# Patient Record
Sex: Male | Born: 1955 | Race: Black or African American | Hispanic: No | Marital: Married
Health system: Northeastern US, Academic
[De-identification: ages and names within clinical notes are randomized; demographics above are authoritative.]

## PROBLEM LIST (undated history)

## (undated) DIAGNOSIS — M109 Gout, unspecified: Secondary | ICD-10-CM

## (undated) HISTORY — DX: Gout, unspecified: M10.9

## (undated) HISTORY — PX: OTHER SURGICAL HISTORY: SHX169

## (undated) HISTORY — PX: HERNIA REPAIR: SHX51

---

## 2000-03-26 ENCOUNTER — Ambulatory Visit: Admission: RE | Admit: 2000-03-26 | Discharge: 2000-03-26 | Payer: Self-pay | Admitting: Neurosurgery

## 2004-02-12 ENCOUNTER — Emergency Department: Payer: Self-pay | Admitting: General Practice

## 2004-02-16 ENCOUNTER — Ambulatory Visit: Payer: Self-pay | Admitting: General Surgery

## 2005-09-27 ENCOUNTER — Emergency Department: Payer: Self-pay | Admitting: Emergency Medicine

## 2005-09-28 ENCOUNTER — Emergency Department: Payer: Self-pay

## 2007-05-29 ENCOUNTER — Emergency Department (HOSPITAL_COMMUNITY): Admission: EM | Admit: 2007-05-29 | Discharge: 2007-05-29 | Payer: Self-pay | Admitting: Emergency Medicine

## 2007-11-13 ENCOUNTER — Emergency Department: Payer: Self-pay | Admitting: Emergency Medicine

## 2008-10-03 ENCOUNTER — Emergency Department: Payer: Self-pay | Admitting: Internal Medicine

## 2009-03-30 ENCOUNTER — Emergency Department: Payer: Self-pay | Admitting: Emergency Medicine

## 2009-04-23 ENCOUNTER — Ambulatory Visit: Payer: Self-pay | Admitting: Gastroenterology

## 2009-05-12 ENCOUNTER — Ambulatory Visit: Payer: Self-pay | Admitting: Gastroenterology

## 2009-05-27 ENCOUNTER — Emergency Department: Payer: Self-pay | Admitting: Emergency Medicine

## 2010-01-17 ENCOUNTER — Emergency Department: Payer: Self-pay | Admitting: Unknown Physician Specialty

## 2011-02-26 ENCOUNTER — Emergency Department: Payer: Self-pay | Admitting: Internal Medicine

## 2011-11-18 ENCOUNTER — Emergency Department: Payer: Self-pay | Admitting: Emergency Medicine

## 2011-11-18 LAB — BASIC METABOLIC PANEL
Calcium, Total: 8.4 mg/dL — ABNORMAL LOW (ref 8.5–10.1)
EGFR (African American): >60
EGFR (Non-African Amer.): 58 — ABNORMAL LOW
Glucose: 95 mg/dL (ref 65–99)
Osmolality: 285 (ref 275–301)
Potassium: 3.9 mmol/L (ref 3.5–5.1)

## 2011-11-18 LAB — CBC
HCT: 44.8 % (ref 40.0–52.0)
HGB: 16.1 g/dL (ref 13.0–18.0)
MCHC: 35.9 g/dL (ref 32.0–36.0)
MCV: 97 fL (ref 80–100)
RDW: 12.9 % (ref 11.5–14.5)

## 2011-11-18 LAB — CK TOTAL AND CKMB (NOT AT ARMC): CK, Total: 317 U/L — ABNORMAL HIGH (ref 35–232)

## 2011-11-18 LAB — TROPONIN I: Troponin-I: <0.02 ng/mL

## 2012-09-09 ENCOUNTER — Emergency Department: Payer: Self-pay | Admitting: Emergency Medicine

## 2013-04-01 ENCOUNTER — Emergency Department: Payer: Self-pay | Admitting: Emergency Medicine

## 2013-09-14 ENCOUNTER — Emergency Department: Payer: Self-pay | Admitting: Emergency Medicine

## 2014-05-06 ENCOUNTER — Emergency Department: Admit: 2014-05-06 | Disposition: A | Discharge status: Home / Self Care | Payer: Self-pay | Admitting: Internal Medicine

## 2014-05-27 ENCOUNTER — Emergency Department
Admit: 2014-05-27 | Disposition: A | Discharge status: Home / Self Care | Payer: Self-pay | Admitting: Emergency Medicine

## 2014-05-28 ENCOUNTER — Other Ambulatory Visit
Admit: 2014-05-28 | Disposition: A | Discharge status: Home / Self Care | Payer: Self-pay | Attending: Specialist | Admitting: Specialist

## 2014-05-28 LAB — SYNOVIAL CELL COUNT + DIFF, W/ CRYSTALS: Nucleated Cell Count: 15687 /mm3

## 2014-06-27 ENCOUNTER — Other Ambulatory Visit: Payer: Self-pay

## 2014-06-27 ENCOUNTER — Emergency Department
Admission: EM | Admit: 2014-06-27 | Discharge: 2014-06-27 | Disposition: A | Discharge status: Home / Self Care | Payer: BLUE CROSS/BLUE SHIELD | Attending: Emergency Medicine | Admitting: Emergency Medicine

## 2014-06-27 ENCOUNTER — Encounter: Payer: Self-pay | Admitting: Emergency Medicine

## 2014-06-27 DIAGNOSIS — I951 Orthostatic hypotension: Secondary | ICD-10-CM | POA: Diagnosis not present

## 2014-06-27 DIAGNOSIS — E860 Dehydration: Secondary | ICD-10-CM | POA: Insufficient documentation

## 2014-06-27 DIAGNOSIS — E861 Hypovolemia: Secondary | ICD-10-CM | POA: Diagnosis not present

## 2014-06-27 DIAGNOSIS — Z720 Tobacco use: Secondary | ICD-10-CM | POA: Diagnosis not present

## 2014-06-27 DIAGNOSIS — R55 Syncope and collapse: Secondary | ICD-10-CM | POA: Diagnosis present

## 2014-06-27 LAB — CBC WITH DIFFERENTIAL/PLATELET
BASOS PCT: 0 %
Basophils Absolute: 0 10*3/uL (ref 0–0.1)
EOS ABS: 0.2 10*3/uL (ref 0–0.7)
EOS PCT: 2 %
HCT: 44.3 % (ref 40.0–52.0)
Hemoglobin: 15 g/dL (ref 13.0–18.0)
Lymphocytes Relative: 11 %
Lymphs Abs: 1.1 10*3/uL (ref 1.0–3.6)
MCH: 33 pg (ref 26.0–34.0)
MCHC: 33.8 g/dL (ref 32.0–36.0)
MCV: 97.6 fL (ref 80.0–100.0)
MONO ABS: 0.7 10*3/uL (ref 0.2–1.0)
MONOS PCT: 7 %
NEUTROS ABS: 8 10*3/uL — AB (ref 1.4–6.5)
Neutrophils Relative %: 80 %
PLATELETS: 161 10*3/uL (ref 150–440)
RBC: 4.54 MIL/uL (ref 4.40–5.90)
RDW: 13.1 % (ref 11.5–14.5)
WBC: 9.9 10*3/uL (ref 3.8–10.6)

## 2014-06-27 LAB — BASIC METABOLIC PANEL
Anion gap: 12 (ref 5–15)
BUN: 9 mg/dL (ref 6–20)
CALCIUM: 8.5 mg/dL — AB (ref 8.9–10.3)
CHLORIDE: 103 mmol/L (ref 101–111)
CO2: 25 mmol/L (ref 22–32)
Creatinine, Ser: 1.39 mg/dL — ABNORMAL HIGH (ref 0.61–1.24)
GFR calc Af Amer: >60 mL/min (ref >60)
GFR, EST NON AFRICAN AMERICAN: 54 mL/min — AB (ref >60)
Glucose, Bld: 125 mg/dL — ABNORMAL HIGH (ref 65–99)
POTASSIUM: 3.6 mmol/L (ref 3.5–5.1)
Sodium: 140 mmol/L (ref 135–145)

## 2014-06-27 MED ORDER — SODIUM CHLORIDE 0.9 % IV BOLUS (SEPSIS)
1000.0000 mL | Freq: Once | INTRAVENOUS | Status: AC
  Administered 2014-06-27: 1000 mL via INTRAVENOUS

## 2014-06-27 NOTE — Discharge Instructions (Signed)
Dehydration, Adult Dehydration is when you lose more fluids from the body than you take in. Vital organs like the kidneys, brain, and heart cannot function without a proper amount of fluids and salt. Any loss of fluids from the body can cause dehydration.  CAUSES   Vomiting.  Diarrhea.  Excessive sweating.  Excessive urine output.  Fever. SYMPTOMS  Mild dehydration  Thirst.  Dry lips.  Slightly dry mouth. Moderate dehydration  Very dry mouth.  Sunken eyes.  Skin does not bounce back quickly when lightly pinched and released.  Dark urine and decreased urine production.  Decreased tear production.  Headache. Severe dehydration  Very dry mouth.  Extreme thirst.  Rapid, weak pulse (more than 100 beats per minute at rest).  Cold hands and feet.  Not able to sweat in spite of heat and temperature.  Rapid breathing.  Blue lips.  Confusion and lethargy.  Difficulty being awakened.  Minimal urine production.  No tears. DIAGNOSIS  Your caregiver will diagnose dehydration based on your symptoms and your exam. Blood and urine tests will help confirm the diagnosis. The diagnostic evaluation should also identify the cause of dehydration. TREATMENT  Treatment of mild or moderate dehydration can often be done at home by increasing the amount of fluids that you drink. It is best to drink small amounts of fluid more often. Drinking too much at one time can make vomiting worse. Refer to the home care instructions below. Severe dehydration needs to be treated at the hospital where you will probably be given intravenous (IV) fluids that contain water and electrolytes. HOME CARE INSTRUCTIONS   Ask your caregiver about specific rehydration instructions.  Drink enough fluids to keep your urine clear or pale yellow.  Drink small amounts frequently if you have nausea and vomiting.  Eat as you normally do.  Avoid:  Foods or drinks high in sugar.  Carbonated  drinks.  Juice.  Extremely hot or cold fluids.  Drinks with caffeine.  Fatty, greasy foods.  Alcohol.  Tobacco.  Overeating.  Gelatin desserts.  Wash your hands well to avoid spreading bacteria and viruses.  Only take over-the-counter or prescription medicines for pain, discomfort, or fever as directed by your caregiver.  Ask your caregiver if you should continue all prescribed and over-the-counter medicines.  Keep all follow-up appointments with your caregiver. SEEK MEDICAL CARE IF:  You have abdominal pain and it increases or stays in one area (localizes).  You have a rash, stiff neck, or severe headache.  You are irritable, sleepy, or difficult to awaken.  You are weak, dizzy, or extremely thirsty. SEEK IMMEDIATE MEDICAL CARE IF:   You are unable to keep fluids down or you get worse despite treatment.  You have frequent episodes of vomiting or diarrhea.  You have blood or green matter (bile) in your vomit.  You have blood in your stool or your stool looks black and tarry.  You have not urinated in 6 to 8 hours, or you have only urinated a small amount of very dark urine.  You have a fever.  You faint. MAKE SURE YOU:   Understand these instructions.  Will watch your condition.  Will get help right away if you are not doing well or get worse. Document Released: 01/16/2005 Document Revised: 04/10/2011 Document Reviewed: 09/05/2010 [facility] Patient Information 2015 [place], [place]. This information is not intended to replace advice given to you by your health care provider. Make sure you discuss any questions you have with your health care  provider.  Syncope Syncope is a medical term for fainting or passing out. This means you lose consciousness and drop to the ground. People are generally unconscious for less than 5 minutes. You may have some muscle twitches for up to 15 seconds before waking up and returning to normal. Syncope occurs more often in older  adults, but it can happen to anyone. While most causes of syncope are not dangerous, syncope can be a sign of a serious medical problem. It is important to seek medical care.  CAUSES  Syncope is caused by a sudden drop in blood flow to the brain. The specific cause is often not determined. Factors that can bring on syncope include:  Taking medicines that lower blood pressure.  Sudden changes in posture, such as standing up quickly.  Taking more medicine than prescribed.  Standing in one place for too long.  Seizure disorders.  Dehydration and excessive exposure to heat.  Low blood sugar (hypoglycemia).  Straining to have a bowel movement.  Heart disease, irregular heartbeat, or other circulatory problems.  Fear, emotional distress, seeing blood, or severe pain. SYMPTOMS  Right before fainting, you may:  Feel dizzy or light-headed.  Feel nauseous.  See all white or all black in your field of vision.  Have cold, clammy skin. DIAGNOSIS  Your health care provider will ask about your symptoms, perform a physical exam, and perform an electrocardiogram (ECG) to record the electrical activity of your heart. Your health care provider may also perform other heart or blood tests to determine the cause of your syncope which may include:  Transthoracic echocardiogram (TTE). During echocardiography, sound waves are used to evaluate how blood flows through your heart.  Transesophageal echocardiogram (TEE).  Cardiac monitoring. This allows your health care provider to monitor your heart rate and rhythm in real time.  Holter monitor. This is a portable device that records your heartbeat and can help diagnose heart arrhythmias. It allows your health care provider to track your heart activity for several days, if needed.  Stress tests by exercise or by giving medicine that makes the heart beat faster. TREATMENT  In most cases, no treatment is needed. Depending on the cause of your syncope,  your health care provider may recommend changing or stopping some of your medicines. HOME CARE INSTRUCTIONS  Have someone stay with you until you feel stable.  Do not drive, use machinery, or play sports until your health care provider says it is okay.  Keep all follow-up appointments as directed by your health care provider.  Lie down right away if you start feeling like you might faint. Breathe deeply and steadily. Wait until all the symptoms have passed.  Drink enough fluids to keep your urine clear or pale yellow.  If you are taking blood pressure or heart medicine, get up slowly and take several minutes to sit and then stand. This can reduce dizziness. SEEK IMMEDIATE MEDICAL CARE IF:   You have a severe headache.  You have unusual pain in the chest, abdomen, or back.  You are bleeding from your mouth or rectum, or you have black or tarry stool.  You have an irregular or very fast heartbeat.  You have pain with breathing.  You have repeated fainting or seizure-like jerking during an episode.  You faint when sitting or lying down.  You have confusion.  You have trouble walking.  You have severe weakness.  You have vision problems. If you fainted, call your local emergency services (911 in [place]  Do not drive yourself to the hospital.  MAKE SURE YOU:  Understand these instructions.  Will watch your condition.  Will get help right away if you are not doing well or get worse. Document Released: 01/16/2005 Document Revised: 01/21/2013 Document Reviewed: 03/17/2011 [facility] Patient Information 2015 [place]. This information is not intended to replace advice given to you by your health care provider. Make sure you discuss any questions you have with your health care provider.  Rehydration, Adult Rehydration is the replacement of body fluids lost during dehydration. Dehydration is an extreme loss of body fluids to the point of body function impairment. There are  many ways extreme fluid loss can occur, including vomiting, diarrhea, or excess sweating. Recovering from dehydration requires replacing lost fluids, continuing to eat to maintain strength, and avoiding foods and beverages that may contribute to further fluid loss or may increase nausea. HOW TO REHYDRATE In most cases, rehydration involves the replacement of not only fluids but also carbohydrates and basic body salts. Rehydration with an oral rehydration solution is one way to replace essential nutrients lost through dehydration. An oral rehydration solution can be purchased at pharmacies, retail stores, and online. Premixed packets of powder that you combine with water to make a solution are also sold. You can prepare an oral rehydration solution at home by mixing the following ingredients together:    - tsp table salt.   tsp baking soda.   tsp salt substitute containing potassium chloride.  1 tablespoons sugar.  1 L (34 oz) of water. Be sure to use exact measurements. Including too much sugar can make diarrhea worse. Drink -1 cup (120-240 mL) of oral rehydration solution each time you have diarrhea or vomit. If drinking this amount makes your vomiting worse, try drinking smaller amounts more often. For example, drink 1-3 tsp every 5-10 minutes.  A general rule for staying hydrated is to drink 1-2 L of fluid per day. Talk to your caregiver about the specific amount you should be drinking each day. Drink enough fluids to keep your urine clear or pale yellow. EATING WHEN DEHYDRATED Even if you have had severe sweating or you are having diarrhea, do not stop eating. Many healthy items in a normal diet are okay to continue eating while recovering from dehydration. The following tips can help you to lessen nausea when you eat:  Ask someone else to prepare your food. Cooking smells may worsen nausea.  Eat in a well-ventilated room away from cooking smells.  Sit up when you eat. Avoid lying down  until 1-2 hours after eating.  Eat small amounts when you eat.  Eat foods that are easy to digest. These include soft, well-cooked, or mashed foods. FOODS AND BEVERAGES TO AVOID Avoid eating or drinking the following foods and beverages that may increase nausea or further loss of fluid:   Fruit juices with a high sugar content, such as concentrated juices.  Alcohol.  Beverages containing caffeine.  Carbonated drinks. They may cause a lot of gas.  Foods that may cause a lot of gas, such as cabbage, broccoli, and beans.  Fatty, greasy, and fried foods.  Spicy, very salty, and very sweet foods or drinks.  Foods or drinks that are very hot or very cold. Consume food or drinks at or near room temperature.  Foods that need a lot of chewing, such as raw vegetables.  Foods that are sticky or hard to swallow, such as peanut butter. Document Released: 04/10/2011 Document Revised: 10/11/2011 Document Reviewed:  04/10/2011 [employer] Patient Information 2015 [place], [place]. This information is not intended to replace advice given to you by your health care provider. Make sure you discuss any questions you have with your health care provider.

## 2014-06-27 NOTE — ED Provider Notes (Signed)
[facility] Emergency Department Provider Note  ____________________________________________  Time seen: 4:20 PM  I have reviewed the triage vital signs and the nursing notes.   HISTORY  Chief Complaint Weakness    HPI Robert Tanner is a 59 y.o. male is brought to the ED by EMS for syncope today. The patient was is in his usual state of health and felt totally fine until he went to donate plasma this morning. After donating plasma, he started to feel weak and fatigued. He got up to walk out of the donation center and felt like he was going to pass out, so he sat down for a minute. Eventually himself home but continued to feel weak and lightheaded and then had an episode of syncope at home. Denies any chest pain shortness of breath headache vision changes numbness tingling or weakness or abdominal pain or back pain around the syncope before or after. He then had a second episode on standing up again. EMS was called and patient was brought to the ED. EMS noted an initial systolic blood pressure of 80, which improved to 100 quickly with initial IV fluids patient currently feels fine except for fatigue. He reports he is only given plasma once a week ago. He notes that he thinks that the recent hot weather with temperatures in the upper 80s and sunshine has affected him in office and also become more dehydrated     History reviewed. No pertinent past medical history. Negative There are no active problems to display for this patient.   Past Surgical History  Procedure Laterality Date  . Left knee surgery      No current outpatient prescriptions on file. None Allergies Review of patient's allergies indicates no known allergies. None History reviewed. No pertinent family history.  Social History History  Substance Use Topics  . Smoking status: Current Every Day Smoker  . Smokeless tobacco: Not on file  . Alcohol Use: Yes    Review of  Systems  Constitutional: No fever or chills. No weight changes, positive fatigue  Eyes:No blurry vision or double vision.  ENT: No sore throat. Cardiovascular: No chest pain. Respiratory: No dyspnea or cough. Gastrointestinal: Negative for abdominal pain, vomiting and diarrhea.  No BRBPR or melena. Genitourinary: Negative for dysuria, urinary retention, bloody urine, or difficulty urinating. Musculoskeletal: Negative for back pain. No joint swelling or pain. Skin: Negative for rash. Neurological: Negative for headaches, focal weakness or numbness. Psychiatric:No anxiety or depression.   Endocrine:No hot/cold intolerance, changes in energy, or sleep difficulty.  10-point ROS otherwise negative.  ____________________________________________   PHYSICAL EXAM:  VITAL SIGNS: ED Triage Vitals  Enc Vitals Group     BP 06/27/14 1608 121/74 mmHg     Pulse Rate 06/27/14 1608 74     Resp 06/27/14 1608 18     Temp 06/27/14 1608 97.4 F (36.3 C)     Temp Source 06/27/14 1608 Oral     SpO2 06/27/14 1608 98 %     Weight 06/27/14 1608 160 lb (72.576 kg)     Height 06/27/14 1608 6' (1.829 m)     Head Cir --      Peak Flow --      Pain Score --      Pain Loc --      Pain Edu? --      Excl. in GC? --      Constitutional: Alert and oriented. Well appearing and in no distress. Eyes: No scleral icterus. No  conjunctival pallor. PERRL. EOMI ENT   Head: Normocephalic and atraumatic.   Nose: No congestion/rhinnorhea. No septal hematoma   Mouth/Throat: MMM, no pharyngeal erythema. No peritonsillar mass. No uvula shift.   Neck: No stridor. No SubQ emphysema. No meningismus. Hematological/Lymphatic/Immunilogical: No cervical lymphadenopathy. Cardiovascular: RRR. Normal and symmetric distal pulses are present in all extremities. No murmurs, rubs, or gallops. Respiratory: Normal respiratory effort without tachypnea nor retractions. Breath sounds are clear and equal bilaterally. No  wheezes/rales/rhonchi. Gastrointestinal: Soft and nontender. No distention. There is no CVA tenderness.  No rebound, rigidity, or guarding. Genitourinary: deferred Musculoskeletal: Nontender with normal range of motion in all extremities. No joint effusions.  No lower extremity tenderness.  No edema. Neurologic:   Normal speech and language.  CN 2-10 normal. Motor grossly intact. No pronator drift.  Normal gait. No gross focal neurologic deficits are appreciated.  Skin:  Skin is warm, dry and intact. No rash noted.  No petechiae, purpura, or bullae. Psychiatric: Mood and affect are normal. Speech and behavior are normal. Patient exhibits appropriate insight and judgment.  ____________________________________________    LABS (pertinent positives/negatives) (all labs ordered are listed, but only abnormal results are displayed) Labs Reviewed  BASIC METABOLIC PANEL - Abnormal; Notable for the following:    Glucose, Bld 125 (*)    Creatinine, Ser 1.39 (*)    Calcium 8.5 (*)    GFR calc non Af Amer 54 (*)    All other components within normal limits  CBC WITH DIFFERENTIAL/PLATELET - Abnormal; Notable for the following:    Neutro Abs 8.0 (*)    All other components within normal limits   ____________________________________________   EKG  Interpreted by me Normal sinus rhythm rate of 74 normal axis and intervals QRS and ST segments and T waves. There is 1 PVC on the 6 second strip.  ____________________________________________    RADIOLOGY    ____________________________________________   PROCEDURES  ____________________________________________   INITIAL IMPRESSION / ASSESSMENT AND PLAN / ED COURSE  Pertinent labs & imaging results that were available during my care of the patient were reviewed by me and considered in my medical decision making (see chart for details).  No acute distress well appearing. History very strongly consistent with dehydration related to  hypovolemia from body fluid donation. The patient feels that he could eat, which may help since he has not eaten since donating. We'll give him food and a liter of IV fluids at that point I think he'll be stable for discharge. We will check his labs in the meantime.  ----------------------------------------- 5:17 PM on 06/27/2014 -----------------------------------------  Workup unremarkable patient even notably stable condition discharge home and follow up with PCP  ____________________________________________   FINAL CLINICAL IMPRESSION(S) / ED DIAGNOSES  Final diagnoses:  Hypovolemia dehydration  Syncope due to orthostatic hypotension      Sharman CheekPhillip Malessa Zartman, MD 06/27/14 1718

## 2014-06-27 NOTE — ED Notes (Addendum)
Patient to ED via EMS with c.o weakness and dizziness. Patient states he donated plasma this AM and has been feeling poorly since. Patient is alert and orietned. Per EMS patient had initial systolic of 80, received 100 cc of NS and systolic elevated to 100. Patient denies nausea or vomiting. Patient also complains of bilateral leg cramps.

## 2014-10-01 ENCOUNTER — Encounter: Payer: Self-pay | Admitting: Family Medicine

## 2014-10-01 ENCOUNTER — Ambulatory Visit (INDEPENDENT_AMBULATORY_CARE_PROVIDER_SITE_OTHER): Payer: BLUE CROSS/BLUE SHIELD | Admitting: Family Medicine

## 2014-10-01 VITALS — BP 109/72 | HR 88 | Temp 98.3°F | Ht 72.0 in | Wt 180.0 lb

## 2014-10-01 DIAGNOSIS — Z23 Encounter for immunization: Secondary | ICD-10-CM

## 2014-10-01 DIAGNOSIS — M1A9XX0 Chronic gout, unspecified, without tophus (tophi): Secondary | ICD-10-CM | POA: Diagnosis not present

## 2014-10-01 DIAGNOSIS — R252 Cramp and spasm: Secondary | ICD-10-CM

## 2014-10-01 LAB — CBC WITH DIFFERENTIAL/PLATELET
Hematocrit: 44.4 % (ref 37.5–51.0)
Hemoglobin: 15.8 g/dL (ref 12.6–17.7)
LYMPHS: 21 %
Lymphocytes Absolute: 1.4 10*3/uL (ref 0.7–3.1)
MCH: 34.9 pg — AB (ref 26.6–33.0)
MCHC: 35.6 g/dL (ref 31.5–35.7)
MCV: 98 fL — AB (ref 79–97)
MID (Absolute): 0.8 10*3/uL (ref 0.1–1.6)
MID: 12 %
NEUTROS PCT: 67 %
Neutrophils Absolute: 4.4 10*3/uL (ref 1.4–7.0)
PLATELETS: 213 10*3/uL (ref 150–379)
RBC: 4.53 x10E6/uL (ref 4.14–5.80)
RDW: 13.6 % (ref 12.3–15.4)
WBC: 6.6 10*3/uL (ref 3.4–10.8)

## 2014-10-01 NOTE — Progress Notes (Signed)
BP 109/72 mmHg  Pulse 88  Temp(Src) 98.3 F (36.8 C)  Ht 6' (1.829 m)  Wt 180 lb (81.647 kg)  BMI 24.41 kg/m2  SpO2 97%   Subjective:    Patient ID: Robert Tanner, male    DOB: 1955-08-24, 59 y.o.   MRN: [id number]  HPI: Robert Tanner is a 59 y.o. male who presents today to establish care and for the following:  Chief Complaint  Patient presents with  . Establish Care    knows he had a colonoscopy, thinks it was 2010. Pt states he is also having cramps in thighs and arms.    2 months ago went and gave plasma and syncopized 2x, was having bad cramps in his legs and arms. Went to the ER and had fluids- was dehydrated.    Has cut back on the plasma donations, but his arms and legs are cramping again.   LEG CRAMPS Duration: 2 months Pain: yes Severity: moderate  Quality:  sharp and pulling Location:  thighs Bilateral:  yes Onset: sudden Frequency: intermittent Time of  day:   at random Sudden unintentional leg jerking:   yes Paresthesias:   no Decreased sensation:  no Weakness:   no Insomnia:   no Fatigue:   no Status: stable Treatments attempted: Increased fluids   Relevant past medical, surgical, family and social history reviewed and updated as indicated. Interim medical history since our last visit reviewed. Allergies and medications reviewed and updated.  Review of Systems  Constitutional: Negative.   Respiratory: Negative.   Gastrointestinal: Negative.   Endocrine: Negative.   Genitourinary: Negative.   Psychiatric/Behavioral: Negative.     Per HPI unless specifically indicated above     Objective:    BP 109/72 mmHg  Pulse 88  Temp(Src) 98.3 F (36.8 C)  Ht 6' (1.829 m)  Wt 180 lb (81.647 kg)  BMI 24.41 kg/m2  SpO2 97%  Wt Readings from Last 3 Encounters:  10/01/14 180 lb (81.647 kg)  06/27/14 160 lb (72.576 kg)    Physical Exam  Constitutional: He is oriented to person, place, and time. He appears well-developed and well-nourished. No  distress.  HENT:  Head: Normocephalic and atraumatic.  Right Ear: Hearing normal.  Left Ear: Hearing normal.  Nose: Nose normal.  Eyes: Conjunctivae and lids are normal. Right eye exhibits no discharge. Left eye exhibits no discharge. No scleral icterus.  Cardiovascular: Normal rate, regular rhythm, normal heart sounds and intact distal pulses.  Exam reveals no gallop and no friction rub.   No murmur heard. Pulmonary/Chest: Effort normal and breath sounds normal. No respiratory distress. He has no wheezes. He has no rales. He exhibits no tenderness.  Musculoskeletal: Normal range of motion.  Neurological: He is alert and oriented to person, place, and time.  Skin: Skin is warm, dry and intact. No rash noted. No erythema. No pallor.  Psychiatric: He has a normal mood and affect. His speech is normal and behavior is normal. Judgment and thought content normal. Cognition and memory are normal.    Results for orders placed or performed during the hospital encounter of 06/27/14  Basic metabolic panel  Result Value Ref Range   Sodium 140 135 - 145 mmol/L   Potassium 3.6 3.5 - 5.1 mmol/L   Chloride 103 101 - 111 mmol/L   CO2 25 22 - 32 mmol/L   Glucose, Bld 125 (H) 65 - 99 mg/dL   BUN 9 6 - 20 mg/dL   Creatinine, Ser 9.52 (H)  0.61 - 1.24 mg/dL   Calcium 8.5 (L) 8.9 - 10.3 mg/dL   GFR calc non Af Amer 54 (L) >60 mL/min   GFR calc Af Amer >60 >60 mL/min   Anion gap 12 5 - 15  CBC with Differential  Result Value Ref Range   WBC 9.9 3.8 - 10.6 K/uL   RBC 4.54 4.40 - 5.90 MIL/uL   Hemoglobin 15.0 13.0 - 18.0 g/dL   HCT 16.1 09.6 - 04.5 %   MCV 97.6 80.0 - 100.0 fL   MCH 33.0 26.0 - 34.0 pg   MCHC 33.8 32.0 - 36.0 g/dL   RDW 40.9 81.1 - 91.4 %   Platelets 161 150 - 440 K/uL   Neutrophils Relative % 80 %   Neutro Abs 8.0 (H) 1.4 - 6.5 K/uL   Lymphocytes Relative 11 %   Lymphs Abs 1.1 1.0 - 3.6 K/uL   Monocytes Relative 7 %   Monocytes Absolute 0.7 0.2 - 1.0 K/uL   Eosinophils  Relative 2 %   Eosinophils Absolute 0.2 0 - 0.7 K/uL   Basophils Relative 0 %   Basophils Absolute 0.0 0 - 0.1 K/uL      Assessment & Plan:   Problem List Items Addressed This Visit    None    Visit Diagnoses    Muscle cramps    -  Primary    Of unclear etiology. Will continue hydrating and will check labs today. Await results. Replace/replenish as neesed.     Relevant Orders    CBC With Differential/Platelet    Comprehensive metabolic panel    Hepatitis C Antibody    TSH    Vit D  25 hydroxy (rtn osteoporosis monitoring)    Immunization due        Tdap given today.     Relevant Orders    Tdap vaccine greater than or equal to 7yo IM (Completed)    Chronic gout without tophus, unspecified cause, unspecified site        Will check uric acid today. Await results.     Relevant Orders    Uric acid        Follow up plan: Return in about 4 weeks (around 10/29/2014).

## 2014-10-01 NOTE — Patient Instructions (Signed)

## 2014-10-02 LAB — COMPREHENSIVE METABOLIC PANEL
ALBUMIN: 3.9 g/dL (ref 3.5–5.5)
ALT: 19 IU/L (ref 0–44)
AST: 19 IU/L (ref 0–40)
Albumin/Globulin Ratio: 1.8 (ref 1.1–2.5)
Alkaline Phosphatase: 72 IU/L (ref 39–117)
BILIRUBIN TOTAL: 0.6 mg/dL (ref 0.0–1.2)
BUN / CREAT RATIO: 11 (ref 9–20)
BUN: 16 mg/dL (ref 6–24)
CO2: 23 mmol/L (ref 18–29)
CREATININE: 1.46 mg/dL — AB (ref 0.76–1.27)
Calcium: 9.1 mg/dL (ref 8.7–10.2)
Chloride: 105 mmol/L (ref 97–108)
GFR, EST AFRICAN AMERICAN: 60 mL/min/{1.73_m2} (ref >59)
GFR, EST NON AFRICAN AMERICAN: 52 mL/min/{1.73_m2} — AB (ref >59)
GLUCOSE: 79 mg/dL (ref 65–99)
Globulin, Total: 2.2 g/dL (ref 1.5–4.5)
Potassium: 4.7 mmol/L (ref 3.5–5.2)
Sodium: 142 mmol/L (ref 134–144)
TOTAL PROTEIN: 6.1 g/dL (ref 6.0–8.5)

## 2014-10-02 LAB — URIC ACID: URIC ACID: 8.7 mg/dL — AB (ref 3.7–8.6)

## 2014-10-02 LAB — HEPATITIS C ANTIBODY: Hep C Virus Ab: <0.1 s/co ratio (ref 0.0–0.9)

## 2014-10-02 LAB — TSH: TSH: 1.14 u[IU]/mL (ref 0.450–4.500)

## 2014-10-02 LAB — VITAMIN D 25 HYDROXY (VIT D DEFICIENCY, FRACTURES): Vit D, 25-Hydroxy: 22.9 ng/mL — ABNORMAL LOW (ref 30.0–100.0)

## 2014-10-03 LAB — PHOSPHORUS: Phosphorus: 3.5 mg/dL (ref 2.5–4.5)

## 2014-10-03 LAB — SPECIMEN STATUS REPORT

## 2014-10-03 LAB — MAGNESIUM: Magnesium: 2 mg/dL (ref 1.6–2.3)

## 2014-10-12 ENCOUNTER — Telehealth: Payer: Self-pay | Admitting: Family Medicine

## 2014-10-12 NOTE — Telephone Encounter (Signed)
Called patient to let him know that his kidney function is down a little bit- will increase water intake. Has also has low vitamin D. Will start OTC 2000 IU vitamin D3. Will call if leg cramps not getting better or getting worse.

## 2014-10-29 ENCOUNTER — Encounter: Payer: Self-pay | Admitting: Emergency Medicine

## 2014-10-29 ENCOUNTER — Emergency Department
Admission: EM | Admit: 2014-10-29 | Discharge: 2014-10-29 | Disposition: A | Discharge status: Home / Self Care | Payer: BLUE CROSS/BLUE SHIELD | Attending: Emergency Medicine | Admitting: Emergency Medicine

## 2014-10-29 ENCOUNTER — Emergency Department: Payer: BLUE CROSS/BLUE SHIELD

## 2014-10-29 DIAGNOSIS — R0989 Other specified symptoms and signs involving the circulatory and respiratory systems: Secondary | ICD-10-CM

## 2014-10-29 DIAGNOSIS — Z720 Tobacco use: Secondary | ICD-10-CM | POA: Diagnosis not present

## 2014-10-29 DIAGNOSIS — J029 Acute pharyngitis, unspecified: Secondary | ICD-10-CM | POA: Diagnosis present

## 2014-10-29 DIAGNOSIS — F458 Other somatoform disorders: Secondary | ICD-10-CM | POA: Insufficient documentation

## 2014-10-29 MED ORDER — RANITIDINE HCL 150 MG/10ML PO SYRP: 150.0000 mg | ORAL_SOLUTION | Freq: Two times a day (BID) | ORAL | Status: DC

## 2014-10-29 MED ORDER — FAMOTIDINE 20 MG PO TABS
40.0000 mg | ORAL_TABLET | Freq: Once | ORAL | Status: AC
  Administered 2014-10-29: 40 mg via ORAL
  Filled 2014-10-29: qty 2

## 2014-10-29 MED ORDER — LIDOCAINE VISCOUS 2 % MT SOLN
15.0000 mL | Freq: Once | OROMUCOSAL | Status: AC
  Administered 2014-10-29: 15 mL via OROMUCOSAL
  Filled 2014-10-29: qty 15

## 2014-10-29 NOTE — Discharge Instructions (Signed)
Dysphagia Swallowing problems (dysphagia) occur when solids and liquids seem to stick in your throat on the way down to your stomach, or the food takes longer to get to the stomach. Other symptoms include regurgitating food, noises coming from the throat, chest discomfort with swallowing, and a feeling of fullness or the feeling of something being stuck in your throat when swallowing. When blockage in your throat is complete, it may be associated with drooling. CAUSES  Problems with swallowing may occur because of problems with the muscles. The food cannot be propelled in the usual manner into your stomach. You may have ulcers, scar tissue, or inflammation in the tube down which food travels from your mouth to your stomach (esophagus), which blocks food from passing normally into the stomach. Causes of inflammation include:  Acid reflux from your stomach into your esophagus.  Infection.  Radiation treatment for cancer.  Medicines taken without enough fluids to wash them down into your stomach. You may have nerve problems that prevent signals from being sent to the muscles of your esophagus to contract and move your food down to your stomach. Globus pharyngeus is a relatively common problem in which there is a sense of an obstruction or difficulty in swallowing, without any physical abnormalities of the swallowing passages being found. This problem usually improves over time with reassurance and testing to rule out other causes. DIAGNOSIS Dysphagia can be diagnosed and its cause can be determined by tests in which you swallow a white substance that helps illuminate the inside of your throat (contrast medium) while X-rays are taken. Sometimes a flexible telescope that is inserted down your throat (endoscopy) to look at your esophagus and stomach is used. TREATMENT   If the dysphagia is caused by acid reflux or infection, medicines may be used.  If the dysphagia is caused by problems with your  swallowing muscles, swallowing therapy may be used to help you strengthen your swallowing muscles.  If the dysphagia is caused by a blockage or mass, procedures to remove the blockage may be done. HOME CARE INSTRUCTIONS  Try to eat soft food that is easier to swallow and check your weight on a daily basis to be sure that it is not decreasing.  Be sure to drink liquids when sitting upright (not lying down). SEEK MEDICAL CARE IF:  You are losing weight because you are unable to swallow.  You are coughing when you drink liquids (aspiration).  You are coughing up partially digested food. SEEK IMMEDIATE MEDICAL CARE IF:  You are unable to swallow your own saliva .  You are having shortness of breath or a fever, or both.  You have a hoarse voice along with difficulty swallowing. MAKE SURE YOU:  Understand these instructions.  Will watch your condition.  Will get help right away if you are not doing well or get worse. Document Released: 01/14/2000 Document Revised: 06/02/2013 Document Reviewed: 07/05/2012 [facility] Patient Information 2015 [place], [place]. This information is not intended to replace advice given to you by your health care provider. Make sure you discuss any questions you have with your health care provider.  Globus Syndrome Globus Syndrome is a feeling of a lump or a sensation of something caught in your throat. Eating food or drinking fluids does not seem to get rid of it. Yet it is not noticeable during the actual act of swallowing food or liquids. Usually there is nothing physically wrong. It is troublesome because it is an unpleasant sensation which is sometimes difficult  to ignore and at times may seem to worsen. The syndrome is quite common. It is estimated 45% of the population experiences features of the condition at some stage during their lives. The symptoms are usually temporary. The largest group of people who feel the need to seek medical treatment is females  between the ages of 72 to 19.  CAUSES  Globus Syndrome appears to be triggered by or aggravated by stress, anxiety and depression.  Tension related to stress could product abnormal muscle spasms in the esophagus which would account for the sensation of a lump or ball in your throat.  Frequent swallowing or drying of the throat caused by anxiety or other strong emotions can also produce this uncomfortable sensation in your throat.  Fear and sadness can be expressed by the body in many ways. For instance, if you had a relative with throat cancer you might become overly concerned about your own health and develop uncomfortable sensations in your throat.  The reaction to a crisis or a trauma event in your life can take the form of a lump in your throat. It is as if you are indirectly saying you can not handle or "swallow" one more thing. DIAGNOSIS  Usually your caregiver will know what is wrong by talking to you and examining you. If the condition persists for several days, more testing may be done to make sure there is not another problem present. This is usually not the case. TREATMENT   Reassurance is often the best treatment available. Usually the problem leaves without treatment over several days.  Sometimes anti-anxiety medications may be prescribed.  Counseling or talk therapy can also help with strong underlying emotions.  Note that in most cases this is not something that keeps coming back and you should not be concerned or worried. Document Released: 04/08/2003 Document Revised: 04/10/2011 Document Reviewed: 09/05/2007 [facility] Patient Information 2015 [place], [place]. This information is not intended to replace advice given to you by your health care provider. Make sure you discuss any questions you have with your health care provider.  You should follow-up with Dr. Mechele Collin for further investigation into your difficulty swallowing. Take the Zantac elixir as directed.  Eat soft foods  to prevent gagging.  Follow-up with your provider as planned.

## 2014-10-29 NOTE — ED Notes (Signed)
Patient comes in complaining of sore throat that started this morning around 5 am and has been very difficult to swallow.

## 2014-10-29 NOTE — ED Provider Notes (Signed)
[facility] Emergency Department Provider Note ____________________________________________  Time seen: 1525  I have reviewed the triage vital signs and the nursing notes.  HISTORY  Chief Complaint  Sore Throat  HPI Robert Tanner is a 59 y.o. male reports to the ED for evaluation of sore throat and difficulty swallowing since about 5 AM. He describes that he awoke from sleep and this morning with a gagging sensation to throat. He denies recent trauma, reflux, cough or congestion. He is noting some pain when he tries to swallow even his spit. He is without fevers, chills, sweats, or vomiting. He denies a previous history of any such occurs. He does admit to some severe reflux a few weeks earlier. He has avoided eating and drinking in last 24 hours due to this increased pain in the room.  Past Medical History  Diagnosis Date  . Gout     There are no active problems to display for this patient.   Past Surgical History  Procedure Laterality Date  . Left knee surgery    . Hernia repair      Current Outpatient Rx  Name  Route  Sig  Dispense  Refill  . colchicine 0.6 MG tablet   Oral   Take 0.6 mg by mouth daily as needed.         . ranitidine (ZANTAC) 150 MG/10ML syrup   Oral   Take 10 mLs (150 mg total) by mouth 2 (two) times daily.   300 mL   0    Allergies Review of patient's allergies indicates no known allergies.  Family History  Problem Relation Age of Onset  . Hypertension Mother   . Stroke Father   . Diabetes Sister   . Diabetes Brother   . Hypertension Brother    Social History Social History  Substance Use Topics  . Smoking status: Current Every Day Smoker -- 1.00 packs/day    Types: Cigarettes  . Smokeless tobacco: Never Used  . Alcohol Use: 14.4 oz/week    24 Cans of beer per week   Review of Systems  Constitutional: Negative for fever. Eyes: Negative for visual changes. ENT: Positive for sore throat and fullness as  above. Cardiovascular: Negative for chest pain. Respiratory: Negative for shortness of breath. Gastrointestinal: Negative for abdominal pain, vomiting and diarrhea. Genitourinary: Negative for dysuria. Musculoskeletal: Negative for back pain. Skin: Negative for rash. Neurological: Negative for headaches, focal weakness or numbness. ____________________________________________  PHYSICAL EXAM:  VITAL SIGNS: ED Triage Vitals  Enc Vitals Group     BP 10/29/14 1412 121/75 mmHg     Pulse Rate 10/29/14 1412 95     Resp 10/29/14 1412 18     Temp 10/29/14 1412 98.7 F (37.1 C)     Temp Source 10/29/14 1412 Oral     SpO2 10/29/14 1412 97 %     Weight 10/29/14 1412 182 lb (82.555 kg)     Height 10/29/14 1412 6' (1.829 m)     Head Cir --      Peak Flow --      Pain Score --      Pain Loc --      Pain Edu? --      Excl. in GC? --    Constitutional: Alert and oriented. Well appearing and in no distress. Eyes: Conjunctivae are normal. PERRL. Normal extraocular movements. ENT   Head: Normocephalic and atraumatic.   Nose: No congestion/rhinorrhea.   Mouth/Throat: Mucous membranes are moist. Uvula is midline  without edema. Normal rise of the soft palateTonsils are small without liths, edema, or exudate. Oropharynx is erythematous generally.    Neck: Supple. No thyromegaly. Hematological/Lymphatic/Immunological: No cervical lymphadenopathy. Cardiovascular: Normal rate, regular rhythm.  Respiratory: Normal respiratory effort. No wheezes/rales/rhonchi. Gastrointestinal: Soft and nontender. No distention. Musculoskeletal: Nontender with normal range of motion in all extremities.  Neurologic:  Normal gait without ataxia. Normal speech and language. No gross focal neurologic deficits are appreciated. Skin:  Skin is warm, dry and intact. No rash noted. Psychiatric: Mood and affect are normal. Patient exhibits appropriate insight and  judgment. ____________________________________________   RADIOLOGY Soft Tissue Neck Film IMPRESSION: No acute abnormality. Osteoarthritic changes of cervical spine.  I, Menshew, Charlesetta Ivory, personally viewed and evaluated these images (plain radiographs) as part of my medical decision making.  ____________________________________________  PROCEDURES  2% viscous lidocaine gargle.  Famotidine 40 mg PO ____________________________________________  INITIAL IMPRESSION / ASSESSMENT AND PLAN / ED COURSE  Reassurance to the patient about normal soft tissue neck film. He is encouraged to follow-up with Dr. Mechele Collin with GI, for ongoing symptoms. He scheduled to see his primary care provider tomorrow afternoon for evaluation. He is discharged home with ranitidine elixir, and encouraged to eat a soft diet. He'll return to the ED immediately for worsening symptoms. ____________________________________________  FINAL CLINICAL IMPRESSION(S) / ED DIAGNOSES  Final diagnoses:  Globus pharyngeus      Lissa Hoard, PA-C 10/29/14 1800  Minna Antis, MD 10/29/14 2013

## 2014-10-30 ENCOUNTER — Ambulatory Visit (INDEPENDENT_AMBULATORY_CARE_PROVIDER_SITE_OTHER): Payer: BLUE CROSS/BLUE SHIELD | Admitting: Family Medicine

## 2014-10-30 ENCOUNTER — Encounter: Payer: Self-pay | Admitting: Family Medicine

## 2014-10-30 VITALS — BP 117/80 | HR 85 | Temp 99.1°F | Ht 70.5 in | Wt 181.0 lb

## 2014-10-30 DIAGNOSIS — R1310 Dysphagia, unspecified: Secondary | ICD-10-CM | POA: Insufficient documentation

## 2014-10-30 DIAGNOSIS — J040 Acute laryngitis: Secondary | ICD-10-CM | POA: Diagnosis not present

## 2014-10-30 DIAGNOSIS — F458 Other somatoform disorders: Secondary | ICD-10-CM | POA: Diagnosis not present

## 2014-10-30 DIAGNOSIS — R0989 Other specified symptoms and signs involving the circulatory and respiratory systems: Secondary | ICD-10-CM

## 2014-10-30 MED ORDER — TRIAMCINOLONE ACETONIDE 40 MG/ML IJ SUSP
40.0000 mg | Freq: Once | INTRAMUSCULAR | Status: AC
  Administered 2014-10-30: 40 mg via INTRAMUSCULAR

## 2014-10-30 MED ORDER — PREDNISONE 10 MG PO TABS: ORAL_TABLET | ORAL | Status: DC

## 2014-10-30 NOTE — Progress Notes (Signed)
BP 117/80 mmHg  Pulse 85  Temp(Src) 99.1 F (37.3 C)  Ht 5' 10.5" (1.791 m)  Wt 181 lb (82.101 kg)  BMI 25.60 kg/m2  SpO2 98%   Subjective:    Patient ID: Robert Tanner, male    DOB: 1955/12/06, 59 y.o.   MRN: [id number]  HPI: Robert Tanner is a 59 y.o. male  Chief Complaint  Patient presents with  . ER Follow Up    Globus Syndrome, patient wants to know if mold could be a cause of this. He states that he also has had a headache for two days and  he never gets headaches.   ER FOLLOW UP Time since discharge: 18 hours Hospital/facility: [facility] Diagnosis: globus pharyngus Procedures/tests: Soft tissue of the neck x-ray Consultants: None New medications: famotidine, viscous lidocaine- didn't fill either Discharge instructions:  Follow up here and GI  Status: stable- no better  DYSPHAGIA- has been exposed to mold over the past couple of days as the ceiling fell down and there was mold on it, had a problem with mold in the past Duration: Started on Wednesday Description of symptom: Feels like a rock or a ball stuck in his throat Onset: Immediately upon swallowing Location of dysphagia: throat Dysphagia to solids only: yes Dysphagia to solids & liquids: yes  Frequency:constant  Progressively getting worse: no Alleviatiating factors: gargling Provoking factors: pillsr and certain foods Status: better- not really EGD: yes Weight loss: no Sensation of lump in throat: yes Heartburn: yes- about 2 weeks ago, then went away Odynophagia: yes Nausea: no Vomiting: no, gagging, but no  Drooling/nasal regurgitation/food spillage: no Coughing/choking/dysphonia: yes Dysarthria: yes Hematemesis: no Regurgitation of undigested food/halitosis: no Chest pain: no  Relevant past medical, surgical, family and social history reviewed and updated as indicated. Interim medical history since our last visit reviewed. Allergies and medications reviewed and updated.  Review of Systems   Constitutional: Negative.   HENT: Negative.   Respiratory: Negative.   Cardiovascular: Negative.   Gastrointestinal: Negative.   Musculoskeletal: Negative.   Psychiatric/Behavioral: Negative.     Per HPI unless specifically indicated above     Objective:    BP 117/80 mmHg  Pulse 85  Temp(Src) 99.1 F (37.3 C)  Ht 5' 10.5" (1.791 m)  Wt 181 lb (82.101 kg)  BMI 25.60 kg/m2  SpO2 98%  Wt Readings from Last 3 Encounters:  10/30/14 181 lb (82.101 kg)  10/29/14 182 lb (82.555 kg)  10/01/14 180 lb (81.647 kg)    Physical Exam  Constitutional: He is oriented to person, place, and time. He appears well-developed and well-nourished. No distress.  HENT:  Head: Normocephalic and atraumatic.  Right Ear: Hearing normal.  Left Ear: Hearing normal.  Nose: Nose normal.  Mouth/Throat: Oropharynx is clear and moist. No oropharyngeal exudate.  Eyes: Conjunctivae, EOM and lids are normal. Right eye exhibits no discharge. Left eye exhibits no discharge. No scleral icterus.  Neck: Normal range of motion. Neck supple. No JVD present. No tracheal deviation present. No thyromegaly present.  Cardiovascular: Normal rate, regular rhythm, normal heart sounds and intact distal pulses.  Exam reveals no gallop and no friction rub.   No murmur heard. Pulmonary/Chest: Effort normal and breath sounds normal. No stridor. No respiratory distress. He has no wheezes. He has no rales. He exhibits no tenderness.  Musculoskeletal: Normal range of motion.  Lymphadenopathy:    He has no cervical adenopathy.  Neurological: He is alert and oriented to person, place, and time.  Skin:  Skin is intact. No rash noted.  Psychiatric: He has a normal mood and affect. His speech is normal and behavior is normal. Judgment and thought content normal. Cognition and memory are normal.  Nursing note and vitals reviewed.   Results for orders placed or performed in visit on 10/01/14  CBC With Differential/Platelet  Result  Value Ref Range   WBC 6.6 3.4 - 10.8 x10E3/uL   RBC 4.53 4.14 - 5.80 x10E6/uL   Hemoglobin 15.8 12.6 - 17.7 g/dL   Hematocrit 16.1 09.6 - 51.0 %   MCV 98 (H) 79 - 97 fL   MCH 34.9 (H) 26.6 - 33.0 pg   MCHC 35.6 31.5 - 35.7 g/dL   RDW 04.5 40.9 - 81.1 %   Platelets 213 150 - 379 x10E3/uL   Neutrophils 67 %   Lymphs 21 %   MID 12 %   Neutrophils Absolute 4.4 1.4 - 7.0 x10E3/uL   Lymphocytes Absolute 1.4 0.7 - 3.1 x10E3/uL   MID (Absolute) 0.8 0.1 - 1.6 X10E3/uL  Comprehensive metabolic panel  Result Value Ref Range   Glucose 79 65 - 99 mg/dL   BUN 16 6 - 24 mg/dL   Creatinine, Ser 9.14 (H) 0.76 - 1.27 mg/dL   GFR calc non Af Amer 52 (L) >59 mL/min/1.73   GFR calc Af Amer 60 >59 mL/min/1.73   BUN/Creatinine Ratio 11 9 - 20   Sodium 142 134 - 144 mmol/L   Potassium 4.7 3.5 - 5.2 mmol/L   Chloride 105 97 - 108 mmol/L   CO2 23 18 - 29 mmol/L   Calcium 9.1 8.7 - 10.2 mg/dL   Total Protein 6.1 6.0 - 8.5 g/dL   Albumin 3.9 3.5 - 5.5 g/dL   Globulin, Total 2.2 1.5 - 4.5 g/dL   Albumin/Globulin Ratio 1.8 1.1 - 2.5   Bilirubin Total 0.6 0.0 - 1.2 mg/dL   Alkaline Phosphatase 72 39 - 117 IU/L   AST 19 0 - 40 IU/L   ALT 19 0 - 44 IU/L  Hepatitis C Antibody  Result Value Ref Range   Hep C Virus Ab <0.1 0.0 - 0.9 s/co ratio  TSH  Result Value Ref Range   TSH 1.140 0.450 - 4.500 uIU/mL  Vit D  25 hydroxy (rtn osteoporosis monitoring)  Result Value Ref Range   Vit D, 25-Hydroxy 22.9 (L) 30.0 - 100.0 ng/mL  Uric acid  Result Value Ref Range   Uric Acid 8.7 (H) 3.7 - 8.6 mg/dL  Specimen status report  Result Value Ref Range   specimen status report Comment   Phosphorus  Result Value Ref Range   Phosphorus 3.5 2.5 - 4.5 mg/dL  Magnesium  Result Value Ref Range   Magnesium 2.0 1.6 - 2.3 mg/dL      Assessment & Plan:   Problem List Items Addressed This Visit      Respiratory   Laryngitis    Potentially allergic reaction to mold given how fast it came on. Kenalog shot given  today. Rx for prednisone given as well. If better, likely due to mold, if not, follow up with GI.       Relevant Medications   triamcinolone acetonide (KENALOG-40) injection 40 mg (Completed)     Other   Odynophagia    Refer to GI for evaluation. Soft foods and liquid diet. Referral generated today.       Relevant Medications   triamcinolone acetonide (KENALOG-40) injection 40 mg (Completed)   Other Relevant Orders  Ambulatory referral to Gastroenterology   Globus pharyngeus - Primary    Differential includes GERD, angioedema, stricture, or mass not seen on soft tissue x-ray as well as globus hystericus. Will treat for potential angioedema with steroids, refer to GI for likely EGD, if GI normal, consider referral to ENT for larynx evaluation. Follow closely.           Follow up plan: Return in about 1 week (around 11/06/2014).

## 2014-10-30 NOTE — Patient Instructions (Signed)

## 2014-10-30 NOTE — Assessment & Plan Note (Signed)
Differential includes GERD, angioedema, stricture, or mass not seen on soft tissue x-ray as well as globus hystericus. Will treat for potential angioedema with steroids, refer to GI for likely EGD, if GI normal, consider referral to ENT for larynx evaluation. Follow closely.

## 2014-10-30 NOTE — Assessment & Plan Note (Signed)
Refer to GI for evaluation. Soft foods and liquid diet. Referral generated today.

## 2014-10-30 NOTE — Assessment & Plan Note (Signed)
Potentially allergic reaction to mold given how fast it came on. Kenalog shot given today. Rx for prednisone given as well. If better, likely due to mold, if not, follow up with GI.

## 2014-11-06 ENCOUNTER — Ambulatory Visit: Payer: BLUE CROSS/BLUE SHIELD | Admitting: Family Medicine

## 2014-11-17 ENCOUNTER — Ambulatory Visit: Payer: Self-pay | Admitting: Gastroenterology

## 2015-02-21 ENCOUNTER — Emergency Department
Admission: EM | Admit: 2015-02-21 | Discharge: 2015-02-21 | Disposition: A | Discharge status: Home / Self Care | Payer: 59 | Attending: Emergency Medicine | Admitting: Emergency Medicine

## 2015-02-21 DIAGNOSIS — F17210 Nicotine dependence, cigarettes, uncomplicated: Secondary | ICD-10-CM | POA: Insufficient documentation

## 2015-02-21 DIAGNOSIS — M10061 Idiopathic gout, right knee: Secondary | ICD-10-CM | POA: Diagnosis not present

## 2015-02-21 DIAGNOSIS — M25561 Pain in right knee: Secondary | ICD-10-CM | POA: Diagnosis present

## 2015-02-21 MED ORDER — OXYCODONE-ACETAMINOPHEN 5-325 MG PO TABS: 1.0000 | ORAL_TABLET | ORAL | Status: DC | PRN

## 2015-02-21 MED ORDER — COLCHICINE 0.6 MG PO TABS: 1.2000 mg | ORAL_TABLET | Freq: Once | ORAL | Status: DC

## 2015-02-21 MED ORDER — INDOMETHACIN 50 MG PO CAPS: 50.0000 mg | ORAL_CAPSULE | Freq: Two times a day (BID) | ORAL | Status: DC

## 2015-02-21 MED ORDER — KETOROLAC TROMETHAMINE 60 MG/2ML IM SOLN
60.0000 mg | Freq: Once | INTRAMUSCULAR | Status: AC
  Administered 2015-02-21: 60 mg via INTRAMUSCULAR
  Filled 2015-02-21: qty 2

## 2015-02-21 MED ORDER — PREDNISONE 10 MG PO TABS: ORAL_TABLET | ORAL | Status: DC

## 2015-02-21 NOTE — ED Notes (Signed)
Pt states that he has been having rt knee pain since Friday, pt states that he has had pain like this in the past and states that it reminds him of his gout, some redness with swelling noted to the rt knee

## 2015-02-21 NOTE — Discharge Instructions (Signed)

## 2015-02-21 NOTE — ED Provider Notes (Signed)
[facility] Emergency Department Provider Note  ____________________________________________  Time seen: Approximately 3:33 PM  I have reviewed the triage vital signs and the nursing notes.   HISTORY  Chief Complaint Knee Pain    HPI Robert Tanner is a 60 y.o. male presents for evaluation of gout exacerbation to his right knee. Patient states that symptoms started 2 days ago and continues to get worse. Denies any medication on to take. No relief with anything over-the-counter. Patient states that the pain does not radiate or travel. Exacerbated and worsened with movement   Past Medical History  Diagnosis Date  . Gout     Patient Active Problem List   Diagnosis Date Noted  . Laryngitis 10/30/2014  . Odynophagia 10/30/2014  . Globus pharyngeus 10/30/2014    Past Surgical History  Procedure Laterality Date  . Left knee surgery    . Hernia repair      Current Outpatient Rx  Name  Route  Sig  Dispense  Refill  . colchicine 0.6 MG tablet   Oral   Take 2 tablets (1.2 mg total) by mouth once.   3 tablet   0   . indomethacin (INDOCIN) 50 MG capsule   Oral   Take 1 capsule (50 mg total) by mouth 2 (two) times daily with a meal.   30 capsule   0   . oxyCODONE-acetaminophen (ROXICET) 5-325 MG tablet   Oral   Take 1-2 tablets by mouth every 4 (four) hours as needed for severe pain.   15 tablet   0   . predniSONE (DELTASONE) 10 MG tablet      Take 6 tabs day 1 then decrease by 1 tablet daily   21 tablet   0     Allergies Review of patient's allergies indicates no known allergies.  Family History  Problem Relation Age of Onset  . Hypertension Mother   . Stroke Father   . Diabetes Sister   . Diabetes Brother   . Hypertension Brother     Social History Social History  Substance Use Topics  . Smoking status: Current Every Day Smoker -- 1.00 packs/day    Types: Cigarettes  . Smokeless tobacco: Never Used  . Alcohol Use: 14.4  oz/week    24 Cans of beer per week    Review of Systems Constitutional: No fever/chills Eyes: No visual changes. ENT: No sore throat. Cardiovascular: Denies chest pain. Respiratory: Denies shortness of breath. Gastrointestinal: No abdominal pain.  No nausea, no vomiting.  No diarrhea.  No constipation. Genitourinary: Negative for dysuria. Musculoskeletal: Right knee pain Skin: Negative for rash. Neurological: Negative for headaches, focal weakness or numbness.  10-point ROS otherwise negative.  ____________________________________________   PHYSICAL EXAM:  VITAL SIGNS: ED Triage Vitals  Enc Vitals Group     BP 02/21/15 1510 109/85 mmHg     Pulse Rate 02/21/15 1510 92     Resp 02/21/15 1510 18     Temp 02/21/15 1510 98 F (36.7 C)     Temp src --      SpO2 02/21/15 1510 94 %     Weight 02/21/15 1510 184 lb (83.462 kg)     Height 02/21/15 1510 6' (1.829 m)     Head Cir --      Peak Flow --      Pain Score 02/21/15 1512 8     Pain Loc --      Pain Edu? --      Excl. in  GC? --     Constitutional: Alert and oriented. Well appearing and in no acute distress. Eyes: Conjunctivae are normal. PERRL. EOMI. Head: Atraumatic. Nose: No congestion/rhinnorhea. Mouth/Throat: Mucous membranes are moist.  Oropharynx non-erythematous. Neck: No stridor.   Cardiovascular: Normal rate, regular rhythm. Grossly normal heart sounds.  Good peripheral circulation. Respiratory: Normal respiratory effort.  No retractions. Lungs CTAB. Gastrointestinal: Soft and nontender. No distention. No abdominal bruits. No CVA tenderness. Musculoskeletal: Right knee with positive erythema warmth and tenderness. Positive edema. Neurologic:  Normal speech and language. No gross focal neurologic deficits are appreciated. No gait instability. Skin:  Skin is warm, dry and intact. No rash noted. Psychiatric: Mood and affect are normal. Speech and behavior are  normal.  ____________________________________________   LABS (all labs ordered are listed, but only abnormal results are displayed)  Labs Reviewed - No data to display   PROCEDURES  Procedure(s) performed: None  Critical Care performed: No  ____________________________________________   INITIAL IMPRESSION / ASSESSMENT AND PLAN / ED COURSE  Pertinent labs & imaging results that were available during my care of the patient were reviewed by me and considered in my medical decision making (see chart for details).  Acute exacerbation of gout to the right knee. Rx given for Indocin 50 mg 3 times a day, colchicine 0.6 mg, Percocet 5/325,. Patient to follow up with PCP or return to the ER with any worsening symptomology. Patient received a shot of Toradol 60 mg IM while in the ED. ____________________________________________   FINAL CLINICAL IMPRESSION(S) / ED DIAGNOSES  Final diagnoses:  Acute idiopathic gout of right knee      Evangeline Dakin, PA-C 02/21/15 1609  Arnaldo Natal, MD 02/21/15 2102

## 2015-03-30 ENCOUNTER — Ambulatory Visit (INDEPENDENT_AMBULATORY_CARE_PROVIDER_SITE_OTHER): Payer: 59 | Admitting: Family Medicine

## 2015-03-30 ENCOUNTER — Encounter: Payer: Self-pay | Admitting: Family Medicine

## 2015-03-30 VITALS — BP 108/72 | HR 81 | Temp 98.4°F | Ht 70.6 in | Wt 180.0 lb

## 2015-03-30 DIAGNOSIS — M7041 Prepatellar bursitis, right knee: Secondary | ICD-10-CM

## 2015-03-30 MED ORDER — NAPROXEN 500 MG PO TABS: 500.0000 mg | ORAL_TABLET | Freq: Two times a day (BID) | ORAL | Status: DC

## 2015-03-30 NOTE — Progress Notes (Signed)
BP 108/72 mmHg  Pulse 81  Temp(Src) 98.4 F (36.9 C)  Ht 5' 10.6" (1.793 m)  Wt 180 lb (81.647 kg)  BMI 25.40 kg/m2  SpO2 97%   Subjective:    Patient ID: Robert Tanner, male    DOB: 03/14/1955, 60 y.o.   MRN: [id number]  HPI: Robert Tanner is a 60 y.o. male  Chief Complaint  Patient presents with  . Knee Pain    Patient states that he had a gout flare a month ago, and now his knees are very tender, the right is worse then the left   KNEE PAIN- had a gout flare about a month ago, went to the ER and was given meds. Got better. But then notes that his knees have been really tender and he can't lean on them Duration: 1 month Involved knee: right Mechanism of injury: unknown Location:anterior Onset: gradual Severity: severe with leaning on it Quality:  sharp and aching Frequency: constant Radiation: no Aggravating factors: climbing a ladder, kneeling on it and stairs  Alleviating factors: rest  Status: worse Treatments attempted: rest, ice, heat and aleve  Relief with NSAIDs?:  no Weakness with weight bearing or walking: yes Sensation of giving way: yes Locking: no Popping: no Bruising: no Swelling: no Redness: no Paresthesias/decreased sensation: no Fevers: no  Relevant past medical, surgical, family and social history reviewed and updated as indicated. Interim medical history since our last visit reviewed. Allergies and medications reviewed and updated.  Review of Systems  Constitutional: Negative.   HENT: Negative.   Respiratory: Negative.   Cardiovascular: Negative.   Musculoskeletal: Positive for arthralgias and gait problem. Negative for myalgias, back pain, joint swelling, neck pain and neck stiffness.  Psychiatric/Behavioral: Negative.     Per HPI unless specifically indicated above     Objective:    BP 108/72 mmHg  Pulse 81  Temp(Src) 98.4 F (36.9 C)  Ht 5' 10.6" (1.793 m)  Wt 180 lb (81.647 kg)  BMI 25.40 kg/m2  SpO2 97%  Wt Readings  from Last 3 Encounters:  03/30/15 180 lb (81.647 kg)  02/21/15 184 lb (83.462 kg)  10/30/14 181 lb (82.101 kg)    Physical Exam  Constitutional: He is oriented to person, place, and time. He appears well-developed and well-nourished. No distress.  HENT:  Head: Normocephalic and atraumatic.  Right Ear: Hearing normal.  Left Ear: Hearing normal.  Nose: Nose normal.  Eyes: Conjunctivae and lids are normal. Right eye exhibits no discharge. Left eye exhibits no discharge. No scleral icterus.  Pulmonary/Chest: Effort normal. No respiratory distress.  Musculoskeletal: He exhibits tenderness. He exhibits no edema.  Antalgic gait No tenderness to the joint line Tenderness to palpation of the patellar tendon  Negative Mcmurrays, negative anterior and posterior drawer, negative appleys compression and distraction  Neurological: He is alert and oriented to person, place, and time.  Skin: Skin is warm, dry and intact. No rash noted. No erythema. No pallor.  Psychiatric: He has a normal mood and affect. His speech is normal and behavior is normal. Judgment and thought content normal. Cognition and memory are normal.  Nursing note and vitals reviewed.   Results for orders placed or performed in visit on 10/01/14  CBC With Differential/Platelet  Result Value Ref Range   WBC 6.6 3.4 - 10.8 x10E3/uL   RBC 4.53 4.14 - 5.80 x10E6/uL   Hemoglobin 15.8 12.6 - 17.7 g/dL   Hematocrit 40.9 81.1 - 51.0 %   MCV 98 (H) 79 -  97 fL   MCH 34.9 (H) 26.6 - 33.0 pg   MCHC 35.6 31.5 - 35.7 g/dL   RDW 56.3 87.5 - 64.3 %   Platelets 213 150 - 379 x10E3/uL   Neutrophils 67 %   Lymphs 21 %   MID 12 %   Neutrophils Absolute 4.4 1.4 - 7.0 x10E3/uL   Lymphocytes Absolute 1.4 0.7 - 3.1 x10E3/uL   MID (Absolute) 0.8 0.1 - 1.6 X10E3/uL  Comprehensive metabolic panel  Result Value Ref Range   Glucose 79 65 - 99 mg/dL   BUN 16 6 - 24 mg/dL   Creatinine, Ser 3.29 (H) 0.76 - 1.27 mg/dL   GFR calc non Af Amer 52 (L)  >59 mL/min/1.73   GFR calc Af Amer 60 >59 mL/min/1.73   BUN/Creatinine Ratio 11 9 - 20   Sodium 142 134 - 144 mmol/L   Potassium 4.7 3.5 - 5.2 mmol/L   Chloride 105 97 - 108 mmol/L   CO2 23 18 - 29 mmol/L   Calcium 9.1 8.7 - 10.2 mg/dL   Total Protein 6.1 6.0 - 8.5 g/dL   Albumin 3.9 3.5 - 5.5 g/dL   Globulin, Total 2.2 1.5 - 4.5 g/dL   Albumin/Globulin Ratio 1.8 1.1 - 2.5   Bilirubin Total 0.6 0.0 - 1.2 mg/dL   Alkaline Phosphatase 72 39 - 117 IU/L   AST 19 0 - 40 IU/L   ALT 19 0 - 44 IU/L  Hepatitis C Antibody  Result Value Ref Range   Hep C Virus Ab <0.1 0.0 - 0.9 s/co ratio  TSH  Result Value Ref Range   TSH 1.140 0.450 - 4.500 uIU/mL  Vit D  25 hydroxy (rtn osteoporosis monitoring)  Result Value Ref Range   Vit D, 25-Hydroxy 22.9 (L) 30.0 - 100.0 ng/mL  Uric acid  Result Value Ref Range   Uric Acid 8.7 (H) 3.7 - 8.6 mg/dL  Specimen status report  Result Value Ref Range   specimen status report Comment   Phosphorus  Result Value Ref Range   Phosphorus 3.5 2.5 - 4.5 mg/dL  Magnesium  Result Value Ref Range   Magnesium 2.0 1.6 - 2.3 mg/dL      Assessment & Plan:   Problem List Items Addressed This Visit    None    Visit Diagnoses    Prepatellar bursitis of right knee    -  Primary    Wear brace. Rest. Naproxen BID x 2 weeks, Exercises given. Ice. Recheck 2-3 weeks. Call if not getting better or getting worse.     Relevant Medications    naproxen (NAPROSYN) 500 MG tablet        Follow up plan: Return 2-3 weeks, for follow up knee.

## 2015-04-09 ENCOUNTER — Ambulatory Visit: Payer: 59 | Admitting: Family Medicine

## 2015-04-20 ENCOUNTER — Ambulatory Visit (INDEPENDENT_AMBULATORY_CARE_PROVIDER_SITE_OTHER): Payer: 59 | Admitting: Family Medicine

## 2015-04-20 ENCOUNTER — Encounter: Payer: Self-pay | Admitting: Family Medicine

## 2015-04-20 VITALS — BP 114/78 | HR 82 | Temp 98.5°F | Ht 70.6 in | Wt 184.0 lb

## 2015-04-20 DIAGNOSIS — N529 Male erectile dysfunction, unspecified: Secondary | ICD-10-CM | POA: Diagnosis not present

## 2015-04-20 DIAGNOSIS — M1A9XX0 Chronic gout, unspecified, without tophus (tophi): Secondary | ICD-10-CM | POA: Insufficient documentation

## 2015-04-20 DIAGNOSIS — M25572 Pain in left ankle and joints of left foot: Secondary | ICD-10-CM

## 2015-04-20 DIAGNOSIS — M7041 Prepatellar bursitis, right knee: Secondary | ICD-10-CM

## 2015-04-20 MED ORDER — TADALAFIL 20 MG PO TABS: 10.0000 mg | ORAL_TABLET | ORAL | Status: DC | PRN

## 2015-04-20 NOTE — Progress Notes (Signed)
BP 114/78 mmHg  Pulse 82  Temp(Src) 98.5 F (36.9 C)  Ht 5' 10.6" (1.793 m)  Wt 184 lb (83.462 kg)  BMI 25.96 kg/m2  SpO2 99%   Subjective:    Patient ID: Robert Tanner, male    DOB: 1955/06/22, 60 y.o.   MRN: [id number]  HPI: Robert Tanner is a 60 y.o. male  Chief Complaint  Patient presents with  . Ankle Pain    Patient states that he woke up Sunday morning with pain and swelling in his left ankle, since then some of the swelling has decreased, but still very painful.  . Knee Pain    Patient states that his knee's are feeling a lot better   Knees have been doing much better.   Ankle PAIN Duration: Saturday Involved foot: left Mechanism of injury: unknown Location: lateral malelolus and top of his foot Onset: sudden  Severity: moderate to severe Quality:  Cramping pain, sharp pain with walking on it Frequency: constant Radiation: yes up the calf Aggravating factors: weight bearing, walking, running and movement  Alleviating factors: NSAIDs  Status: better Treatments attempted: rest, APAP and ibuprofen  Relief with NSAIDs?:  moderate Weakness with weight bearing or walking: no Morning stiffness: yes Swelling: yes Redness: no Bruising: no Paresthesias / decreased sensation: no  Fevers:no  ERECTILE PROBLEM Duration: chronic Onset: gradual Problem with getting erections: yes Problem with sustaining erections: yes Poor erectile quality: yes Frequency of dysfunction: constant Main problem:, getting, keeping erections and quality Morning/nocturnal erections: no Decreased libido:  yes New medications: no Sexual activity: monogamous Relationship problems: no Good communication with sexual partner: no Satisfaction with current partner: yes Anxiety: no Treatments attempted: none- wants cialis Hypertension: no Diabetes: no Atherosclerosis: no Spinal cord disease/injury: no Pelvic trauma or surgery: no  Relevant past medical, surgical, family and social  history reviewed and updated as indicated. Interim medical history since our last visit reviewed. Allergies and medications reviewed and updated.  Review of Systems  Constitutional: Negative.   Respiratory: Negative.   Cardiovascular: Negative.   Musculoskeletal: Positive for joint swelling, arthralgias and gait problem. Negative for myalgias, back pain, neck pain and neck stiffness.  Skin: Negative for color change, pallor, rash and wound.  Psychiatric/Behavioral: Negative.     Per HPI unless specifically indicated above     Objective:    BP 114/78 mmHg  Pulse 82  Temp(Src) 98.5 F (36.9 C)  Ht 5' 10.6" (1.793 m)  Wt 184 lb (83.462 kg)  BMI 25.96 kg/m2  SpO2 99%  Wt Readings from Last 3 Encounters:  04/20/15 184 lb (83.462 kg)  03/30/15 180 lb (81.647 kg)  02/21/15 184 lb (83.462 kg)    Physical Exam  Constitutional: He is oriented to person, place, and time. He appears well-developed and well-nourished. No distress.  HENT:  Head: Normocephalic and atraumatic.  Right Ear: Hearing normal.  Left Ear: Hearing normal.  Nose: Nose normal.  Eyes: Conjunctivae and lids are normal. Right eye exhibits no discharge. Left eye exhibits no discharge. No scleral icterus.  Pulmonary/Chest: Effort normal. No respiratory distress.  Musculoskeletal: Normal range of motion. He exhibits edema and tenderness.  Antalgic gait Swelling and mild tenderness over lateral maleolus on the L, decreased ROM due to pain, tight ROM, no redness or heat  Neurological: He is alert and oriented to person, place, and time.  Skin: Skin is warm, dry and intact. No rash noted. No erythema. No pallor.  Psychiatric: He has a normal mood and affect.  His speech is normal and behavior is normal. Judgment and thought content normal. Cognition and memory are normal.    Results for orders placed or performed in visit on 10/01/14  CBC With Differential/Platelet  Result Value Ref Range   WBC 6.6 3.4 - 10.8 x10E3/uL    RBC 4.53 4.14 - 5.80 x10E6/uL   Hemoglobin 15.8 12.6 - 17.7 g/dL   Hematocrit 16.1 09.6 - 51.0 %   MCV 98 (H) 79 - 97 fL   MCH 34.9 (H) 26.6 - 33.0 pg   MCHC 35.6 31.5 - 35.7 g/dL   RDW 04.5 40.9 - 81.1 %   Platelets 213 150 - 379 x10E3/uL   Neutrophils 67 %   Lymphs 21 %   MID 12 %   Neutrophils Absolute 4.4 1.4 - 7.0 x10E3/uL   Lymphocytes Absolute 1.4 0.7 - 3.1 x10E3/uL   MID (Absolute) 0.8 0.1 - 1.6 X10E3/uL  Comprehensive metabolic panel  Result Value Ref Range   Glucose 79 65 - 99 mg/dL   BUN 16 6 - 24 mg/dL   Creatinine, Ser 9.14 (H) 0.76 - 1.27 mg/dL   GFR calc non Af Amer 52 (L) >59 mL/min/1.73   GFR calc Af Amer 60 >59 mL/min/1.73   BUN/Creatinine Ratio 11 9 - 20   Sodium 142 134 - 144 mmol/L   Potassium 4.7 3.5 - 5.2 mmol/L   Chloride 105 97 - 108 mmol/L   CO2 23 18 - 29 mmol/L   Calcium 9.1 8.7 - 10.2 mg/dL   Total Protein 6.1 6.0 - 8.5 g/dL   Albumin 3.9 3.5 - 5.5 g/dL   Globulin, Total 2.2 1.5 - 4.5 g/dL   Albumin/Globulin Ratio 1.8 1.1 - 2.5   Bilirubin Total 0.6 0.0 - 1.2 mg/dL   Alkaline Phosphatase 72 39 - 117 IU/L   AST 19 0 - 40 IU/L   ALT 19 0 - 44 IU/L  Hepatitis C Antibody  Result Value Ref Range   Hep C Virus Ab <0.1 0.0 - 0.9 s/co ratio  TSH  Result Value Ref Range   TSH 1.140 0.450 - 4.500 uIU/mL  Vit D  25 hydroxy (rtn osteoporosis monitoring)  Result Value Ref Range   Vit D, 25-Hydroxy 22.9 (L) 30.0 - 100.0 ng/mL  Uric acid  Result Value Ref Range   Uric Acid 8.7 (H) 3.7 - 8.6 mg/dL  Specimen status report  Result Value Ref Range   specimen status report Comment   Phosphorus  Result Value Ref Range   Phosphorus 3.5 2.5 - 4.5 mg/dL  Magnesium  Result Value Ref Range   Magnesium 2.0 1.6 - 2.3 mg/dL      Assessment & Plan:   Problem List Items Addressed This Visit      Genitourinary   Erectile dysfunction    Will start cialis. Discussed importance of telling EMTs if he has taken it. Continue to monitor.         Other    Gout, chronic    Not red or swollen, will check uric acid level, would likely benefit from allopurionol.      Relevant Orders   Uric acid   DG Ankle Complete Left    Other Visit Diagnoses    Left ankle pain    -  Primary    Will check x-ray. Continue indomethacin. Checking uric acid. Work on stretching. Call if not getting better or getting worse.     Prepatellar bursitis of right knee  Resolved.         Follow up plan: Return if symptoms worsen or fail to improve, for Pending x-ray.

## 2015-04-20 NOTE — Assessment & Plan Note (Signed)
Will start cialis. Discussed importance of telling EMTs if he has taken it. Continue to monitor.

## 2015-04-20 NOTE — Assessment & Plan Note (Signed)
Not red or swollen, will check uric acid level, would likely benefit from allopurionol.

## 2015-04-21 ENCOUNTER — Telehealth: Payer: Self-pay | Admitting: Family Medicine

## 2015-04-21 ENCOUNTER — Ambulatory Visit
Admission: RE | Admit: 2015-04-21 | Discharge: 2015-04-21 | Disposition: A | Discharge status: Home / Self Care | Payer: 59 | Source: Ambulatory Visit | Attending: Family Medicine | Admitting: Family Medicine

## 2015-04-21 DIAGNOSIS — M1A9XX0 Chronic gout, unspecified, without tophus (tophi): Secondary | ICD-10-CM

## 2015-04-21 DIAGNOSIS — M25572 Pain in left ankle and joints of left foot: Secondary | ICD-10-CM | POA: Insufficient documentation

## 2015-04-21 DIAGNOSIS — M19072 Primary osteoarthritis, left ankle and foot: Secondary | ICD-10-CM | POA: Insufficient documentation

## 2015-04-21 LAB — URIC ACID: Uric Acid: 7.8 mg/dL (ref 3.7–8.6)

## 2015-04-21 NOTE — Telephone Encounter (Signed)
Called patient to give him the results of his blood work and x-ray. Gout test was normal. X-ray was normal. We'll keep an eye on what's going on, but everything looks good so far. 2nd phone number on chart is NOT him, and was told not to call there.

## 2015-04-23 NOTE — Telephone Encounter (Signed)
Called and gave him his results.

## 2015-07-23 ENCOUNTER — Telehealth: Payer: Self-pay

## 2015-07-23 NOTE — Telephone Encounter (Signed)
Spoke with patient, his work will pay for his visits with us, so patient just needs us to fill out the papers and attach a bill.

## 2015-07-23 NOTE — Telephone Encounter (Signed)
Received paperwork from patient, unsure what needs to be done with this. Left a message for patient to return my call so that I can discuss what needs to be done with it.

## 2015-08-30 ENCOUNTER — Other Ambulatory Visit: Payer: Self-pay

## 2015-08-30 ENCOUNTER — Ambulatory Visit (INDEPENDENT_AMBULATORY_CARE_PROVIDER_SITE_OTHER): Payer: 59 | Admitting: Family Medicine

## 2015-08-30 ENCOUNTER — Encounter: Payer: Self-pay | Admitting: Family Medicine

## 2015-08-30 VITALS — BP 128/81 | HR 93 | Temp 98.7°F | Wt 185.0 lb

## 2015-08-30 DIAGNOSIS — K219 Gastro-esophageal reflux disease without esophagitis: Secondary | ICD-10-CM | POA: Diagnosis not present

## 2015-08-30 DIAGNOSIS — L509 Urticaria, unspecified: Secondary | ICD-10-CM | POA: Diagnosis not present

## 2015-08-30 DIAGNOSIS — R5383 Other fatigue: Secondary | ICD-10-CM | POA: Diagnosis not present

## 2015-08-30 MED ORDER — HYDROXYZINE HCL 25 MG PO TABS: 25.0000 mg | ORAL_TABLET | Freq: Three times a day (TID) | ORAL | 0 refills | Status: DC | PRN

## 2015-08-30 MED ORDER — CETIRIZINE HCL 10 MG PO TABS: 10.0000 mg | ORAL_TABLET | Freq: Every day | ORAL | 11 refills | Status: DC

## 2015-08-30 MED ORDER — ESOMEPRAZOLE MAGNESIUM 40 MG PO CPDR: 40.0000 mg | DELAYED_RELEASE_CAPSULE | Freq: Every day | ORAL | 3 refills | Status: DC

## 2015-08-30 NOTE — Progress Notes (Signed)
BP 128/81   Pulse 93   Temp 98.7 F (37.1 C)   Wt 185 lb (83.9 kg)   SpO2 97%   BMI 26.10 kg/m    Subjective:    Patient ID: Robert Tanner, male    DOB: 08/18/1955, 60 y.o.   MRN: [id number]  HPI: Robert Tanner is a 60 y.o. male  Chief Complaint  Patient presents with  . Urticaria    z 2 months. "all over", comes and goes, does itch. He tried changing detergants to see but that didn't help. 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   . Fatigue    x 6 weeks, just has no energy, falls asleep while riding or if he sits still for too long  . Sore Throat    x 10 days, plus cough, some congestion, no fever.   New onset urticaria sporadic all over body. Has been taking benadryl which helps, but he can't sleep through the night from all the itching. Never had an allergy issue in the past. Tried switching detergents but that didn't help. 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   Feels very fatigued the past week and states that almost all of his joints are severely achy. Works in [employer] and other unfinished spaces so believes it could be very possible that he was bitten by a tick. New onset daytime somnolence that patient feels he can't control.   Sore throat, cough, and hoarseness for about 2 weeks now. Does have a history of acid reflux - noticed flaring up of his indigestion lately, has been taking rolaids or TUMS. Historically has been seen in ED for this issue, imaging was normal at that time. Given nexium with good relief.   Relevant past medical, surgical, family and social history reviewed and updated as indicated. Interim medical history since our last visit reviewed. Allergies and medications reviewed and updated.  Past Medical History:  Diagnosis Date  . Gout    SH - Current every day smoker, drinks 6 beers daily  Review of Systems  Constitutional: Positive for fatigue. Negative for fever.    HENT: Positive for congestion and voice change.   Eyes: Negative.   Respiratory: Positive for cough.   Cardiovascular: Negative.   Gastrointestinal: Positive for nausea.  Musculoskeletal: Positive for arthralgias.  Skin:       hives  Neurological: Negative.   Psychiatric/Behavioral: Negative.     Per HPI unless specifically indicated above     Objective:    BP 128/81   Pulse 93   Temp 98.7 F (37.1 C)   Wt 185 lb (83.9 kg)   SpO2 97%   BMI 26.10 kg/m   Wt Readings from Last 3 Encounters:  08/30/15 185 lb (83.9 kg)  04/20/15 184 lb (83.5 kg)  03/30/15 180 lb (81.6 kg)    Physical Exam  Constitutional: He is oriented to person, place, and time. He appears well-developed and well-nourished. No distress.  HENT:  Head: Atraumatic.  Oropharynx erythematous without exudates or tonsillar edema  Eyes: Conjunctivae are normal. No scleral icterus.  Neck: Normal range of motion. Neck supple. No tracheal deviation present. No thyromegaly present.  Cardiovascular: Normal rate.   Pulmonary/Chest: Effort normal.  Musculoskeletal: Normal range of motion.  Lymphadenopathy:    He has no cervical adenopathy.  Neurological: He is alert and oriented to person, place, and time.  Skin: Skin is warm and dry.  Several patches  of urticaria on R wrist and back  Psychiatric: He has a normal mood and affect. His behavior is normal.  Nursing note and vitals reviewed.      Assessment & Plan:   Problem List Items Addressed This Visit      Digestive   GERD (gastroesophageal reflux disease)    Will start him back on Nexium, consider GI referral in 4-6 weeks if no improvement of symptoms. Discussed smoking and alcohol cessation as these can increase esophageal cancer risk and exacerbate symptoms. Pt not ready to quit either. Discussed dietary modification for symptom reduction.       Relevant Medications   esomeprazole (NEXIUM) 40 MG capsule    Other Visit Diagnoses    Other fatigue    -   Primary   Relevant Orders   TSH   CBC with Differential/Platelet   Rocky mtn spotted fvr ab, IgG-blood   Lyme Ab/Western Blot Reflex   Urticaria       Relevant Orders   Ambulatory referral to Allergy     Await fatigue labs. Hydroxyzine and Zyrtec sent to help control urticaria, allergy referral placed to identify trigger. Discussed hydroxyzine being sedating and to only take at night until he knows how it will affect him. Hoping it will help him rest as well.   Follow up plan: Return Needs to schedule PE.

## 2015-08-30 NOTE — Assessment & Plan Note (Signed)
Will start him back on Nexium, consider GI referral in 4-6 weeks if no improvement of symptoms. Discussed smoking and alcohol cessation as these can increase esophageal cancer risk and exacerbate symptoms. Pt not ready to quit either. Discussed dietary modification for symptom reduction.

## 2015-08-31 ENCOUNTER — Other Ambulatory Visit: Payer: Self-pay | Admitting: Family Medicine

## 2015-08-31 ENCOUNTER — Telehealth: Payer: Self-pay | Admitting: Family Medicine

## 2015-08-31 MED ORDER — CYCLOBENZAPRINE HCL 10 MG PO TABS: 10.0000 mg | ORAL_TABLET | Freq: Three times a day (TID) | ORAL | 0 refills | Status: DC | PRN

## 2015-08-31 MED ORDER — PREDNISONE 20 MG PO TABS: 40.0000 mg | ORAL_TABLET | Freq: Every day | ORAL | 0 refills | Status: DC

## 2015-08-31 NOTE — Telephone Encounter (Signed)
Routing to provider  

## 2015-08-31 NOTE — Telephone Encounter (Signed)
Called pt, sent in prednisone and flexeril as he is c/o elbow joint pain and muscle spasms not relieved with indomethacin. Discussed to follow up in a few days if no improvement and ER if severe worsening or fever/chills, hot joint. Work note for today waiting for him at front desk.

## 2015-09-01 ENCOUNTER — Telehealth: Payer: Self-pay | Admitting: Family Medicine

## 2015-09-01 LAB — CBC WITH DIFFERENTIAL/PLATELET
BASOS ABS: 0 10*3/uL (ref 0.0–0.2)
Basos: 0 %
EOS (ABSOLUTE): 0.2 10*3/uL (ref 0.0–0.4)
EOS: 4 %
HEMATOCRIT: 45.4 % (ref 37.5–51.0)
HEMOGLOBIN: 15.4 g/dL (ref 12.6–17.7)
Immature Grans (Abs): 0 10*3/uL (ref 0.0–0.1)
Immature Granulocytes: 0 %
LYMPHS ABS: 1.7 10*3/uL (ref 0.7–3.1)
Lymphs: 26 %
MCH: 33.8 pg — AB (ref 26.6–33.0)
MCHC: 33.9 g/dL (ref 31.5–35.7)
MCV: 100 fL — ABNORMAL HIGH (ref 79–97)
MONOCYTES: 8 %
MONOS ABS: 0.5 10*3/uL (ref 0.1–0.9)
NEUTROS ABS: 3.9 10*3/uL (ref 1.4–7.0)
Neutrophils: 62 %
Platelets: 227 10*3/uL (ref 150–379)
RBC: 4.55 x10E6/uL (ref 4.14–5.80)
RDW: 13.5 % (ref 12.3–15.4)
WBC: 6.4 10*3/uL (ref 3.4–10.8)

## 2015-09-01 LAB — ROCKY MTN SPOTTED FVR AB, IGG-BLOOD: RMSF IgG: NEGATIVE

## 2015-09-01 LAB — LYME AB/WESTERN BLOT REFLEX
LYME DISEASE AB, QUANT, IGM: <0.8 index (ref 0.00–0.79)
Lyme IgG/IgM Ab: <0.91 {ISR} (ref 0.00–0.90)

## 2015-09-01 LAB — TSH: TSH: 1.29 u[IU]/mL (ref 0.450–4.500)

## 2015-09-01 NOTE — Telephone Encounter (Signed)
Pt would like to get a work note for today. He has already got the note for yesterday.

## 2015-09-01 NOTE — Telephone Encounter (Signed)
Letter printed and ready for pick up

## 2015-09-01 NOTE — Telephone Encounter (Signed)
Routing to provider  

## 2015-09-01 NOTE — Telephone Encounter (Signed)
Patient notified

## 2015-09-02 ENCOUNTER — Encounter: Payer: Self-pay | Admitting: Family Medicine

## 2015-09-08 ENCOUNTER — Telehealth: Payer: Self-pay | Admitting: Family Medicine

## 2015-09-08 NOTE — Telephone Encounter (Signed)
Pt called stated the medication he was prescribed helped with hives. Pt stated he stopped the medication for a day or 2 and the hives came back. Pt would like advice on what to do about the hives until his appt with a specialist on 09/17/15. Please call pt ASAP. Thanks.

## 2015-09-08 NOTE — Telephone Encounter (Signed)
Routing to provider  

## 2015-09-08 NOTE — Telephone Encounter (Signed)
Please advise pt to get back on medications until his allergy appointment coming up. Thanks

## 2015-09-08 NOTE — Telephone Encounter (Signed)
Patient notified. Advised him to restart the Prednisone and finish it regardless of if his symptoms resolve.

## 2015-09-20 ENCOUNTER — Encounter (INDEPENDENT_AMBULATORY_CARE_PROVIDER_SITE_OTHER): Payer: Self-pay

## 2015-10-05 ENCOUNTER — Encounter: Payer: 59 | Admitting: Family Medicine

## 2016-02-14 ENCOUNTER — Other Ambulatory Visit: Payer: Self-pay

## 2016-02-14 MED ORDER — INDOMETHACIN 50 MG PO CAPS: 50.0000 mg | ORAL_CAPSULE | Freq: Two times a day (BID) | ORAL | 0 refills | Status: DC | PRN

## 2016-02-14 NOTE — Telephone Encounter (Signed)
Patient called and is having a gout flare up. Requesting a refill on his gout medication.  [facility] [address].

## 2016-03-12 ENCOUNTER — Other Ambulatory Visit: Payer: Self-pay | Admitting: Family Medicine

## 2016-03-31 ENCOUNTER — Other Ambulatory Visit: Payer: Self-pay

## 2016-03-31 MED ORDER — INDOMETHACIN 50 MG PO CAPS: 50.0000 mg | ORAL_CAPSULE | Freq: Two times a day (BID) | ORAL | 0 refills | Status: DC | PRN

## 2016-03-31 NOTE — Telephone Encounter (Signed)
Patient is having a gout flare and was wondering if he could a refill on his gout medicine.

## 2016-06-01 ENCOUNTER — Other Ambulatory Visit: Payer: Self-pay | Admitting: Family Medicine

## 2016-06-01 ENCOUNTER — Telehealth: Payer: Self-pay | Admitting: Family Medicine

## 2016-06-01 MED ORDER — INDOMETHACIN 50 MG PO CAPS: 50.0000 mg | ORAL_CAPSULE | Freq: Two times a day (BID) | ORAL | 0 refills | Status: DC | PRN

## 2016-06-01 NOTE — Telephone Encounter (Signed)
Called patient to give him the information regarding the script.  He just started new job so he said he will call back 5/4 to schedule the follow up appt with Dr Laural BenesJohnson.  Thanks

## 2016-06-01 NOTE — Telephone Encounter (Signed)
Patient would like Dr Laural BenesJohnson to call him in a ref[facility]ometh[facility] [address] [place]  Thank You

## 2016-06-01 NOTE — Telephone Encounter (Signed)
Rx sent to his pharmacy- but he needs a follow up appointment please. Thanks!

## 2016-06-02 NOTE — Telephone Encounter (Signed)
FYI

## 2016-06-28 ENCOUNTER — Other Ambulatory Visit: Payer: Self-pay | Admitting: Family Medicine

## 2017-04-15 ENCOUNTER — Emergency Department
Admission: EM | Admit: 2017-04-15 | Discharge: 2017-04-15 | Disposition: A | Discharge status: Home / Self Care | Payer: Self-pay | Attending: Emergency Medicine | Admitting: Emergency Medicine

## 2017-04-15 ENCOUNTER — Encounter: Payer: Self-pay | Admitting: Emergency Medicine

## 2017-04-15 ENCOUNTER — Emergency Department: Payer: Self-pay

## 2017-04-15 DIAGNOSIS — J069 Acute upper respiratory infection, unspecified: Secondary | ICD-10-CM | POA: Insufficient documentation

## 2017-04-15 DIAGNOSIS — B9789 Other viral agents as the cause of diseases classified elsewhere: Secondary | ICD-10-CM

## 2017-04-15 DIAGNOSIS — F17210 Nicotine dependence, cigarettes, uncomplicated: Secondary | ICD-10-CM | POA: Insufficient documentation

## 2017-04-15 DIAGNOSIS — Z79899 Other long term (current) drug therapy: Secondary | ICD-10-CM | POA: Insufficient documentation

## 2017-04-15 LAB — INFLUENZA PANEL BY PCR (TYPE A & B)
Influenza A By PCR: NEGATIVE
Influenza B By PCR: NEGATIVE

## 2017-04-15 MED ORDER — DEXAMETHASONE SODIUM PHOSPHATE 10 MG/ML IJ SOLN
10.0000 mg | Freq: Once | INTRAMUSCULAR | Status: AC
Start: 2017-04-15 — End: 2017-04-15
  Administered 2017-04-15: 10 mg via INTRAMUSCULAR
  Filled 2017-04-15: qty 1

## 2017-04-15 MED ORDER — BENZONATATE 100 MG PO CAPS: ORAL_CAPSULE | ORAL | 0 refills | Status: DC

## 2017-04-15 MED ORDER — PREDNISONE 10 MG PO TABS: 10.0000 mg | ORAL_TABLET | Freq: Two times a day (BID) | ORAL | 0 refills | Status: DC

## 2017-04-15 NOTE — Discharge Instructions (Signed)
Your exam, flu test, and chest x-ray are negative today. You should continue to monitor and treat any symptoms. Take your home meds and the prescription meds as directed. See your provider or return as needed.

## 2017-04-15 NOTE — ED Triage Notes (Signed)
Pt comes into the ED via [facility] c/o cough, body aches, and headaches.  Patient denies being around any known person with flu.  Patient in NAD at this time with even and unlabored respirations.

## 2017-04-15 NOTE — ED Notes (Signed)
Pt reports that he has had headaches, mucus, wheezing and generalized pain and weakness since last Monday.  Pt states that his last fever was on Thursday.  Pt is A&Ox4, in NAD.  Ambulatory from triage.  Pt reports taking robitussin, dayquil, nyquil, tylenol and aleve for symptoms.  Pt denies any relief.

## 2017-04-17 NOTE — ED Provider Notes (Addendum)
[facility] Emergency Department Provider Note ____________________________________________  Time seen: 1250  I have reviewed the triage vital signs and the nursing notes.  HISTORY  Chief Complaint  Cough and Generalized Body Aches  HPI Robert Tanner is a 62 y.o. male presents to the ED for evaluation of cough, bodies, and headaches.  Patient denies any known sick contacts.  He also denies receiving a seasonal flu vaccine.  Patient reports he had similar symptoms about 4 weeks earlier, that resolved completely.  Then he reports onset of symptoms about a week ago.  He is continued to have a nonproductive cough but denies any chest pain or shortness of breath.  Past Medical History:  Diagnosis Date  . Gout     Patient Active Problem List   Diagnosis Date Noted  . GERD (gastroesophageal reflux disease) 08/30/2015  . Gout, chronic 04/20/2015  . Erectile dysfunction 04/20/2015  . Odynophagia 10/30/2014  . 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 HERNIA REPAIR    . left knee surgery      Prior to Admission medications   Medication Sig Start Date End Date Taking? Authorizing Provider  benzonatate (TESSALON PERLES) 100 MG capsule Take 1-2 tabs TID prn cough 04/15/17   Giancarlos Berendt, Charlesetta Ivory, PA-C  cetirizine (ZYRTEC) 10 MG tablet Take 1 tablet (10 mg total) by mouth daily. 08/30/15   Particia Nearing, PA-C  cyclobenzaprine (FLEXERIL) 10 MG tablet Take 1 tablet (10 mg total) by mouth 3 (three) times daily as needed for muscle spasms. 08/31/15   Particia Nearing, PA-C  esomeprazole (NEXIUM) 40 MG capsule Take 1 capsule (40 mg total) by mouth daily. 08/30/15   Particia Nearing, PA-C  hydrOXYzine (ATARAX/VISTARIL) 25 MG tablet Take 1 tablet (25 mg total) by mouth 3 (three) times daily as needed. 08/30/15   Particia Nearing, PA-C  indomethacin (INDOCIN) 50 MG capsule TAKE 1 CAPSULE (50 MG TOTAL) BY  MOUTH 2 (TWO) TIMES DAILY AS NEEDED. 06/28/16   Laural Benes, Megan P, DO  naproxen (NAPROSYN) 500 MG tablet Take 1 tablet (500 mg total) by mouth 2 (two) times daily with a meal. Patient taking differently: Take 500 mg by mouth 2 (two) times daily as needed.  03/30/15   Olevia Perches P, DO  predniSONE (DELTASONE) 10 MG tablet Take 1 tablet (10 mg total) by mouth 2 (two) times daily with a meal. 04/15/17   Karysa Heft, Charlesetta Ivory, PA-C    Allergies Patient has no known allergies.  Family History  Problem Relation Age of Onset  . Hypertension Mother   . Stroke Father   . Diabetes Sister   . Diabetes Brother   . Hypertension Brother     Social History Social History   Tobacco Use  . Smoking status: Current Every Day Smoker    Packs/day: 1.00    Types: Cigarettes  . Smokeless tobacco: Never Used  Substance Use Topics  . Alcohol use: Yes    Alcohol/week: 25.2 oz    Types: 42 Cans of beer per week    Comment: drinks approx a 6 pack a day.  . Drug use: No    Review of Systems  Constitutional: Negative for fever. Eyes: Negative for visual changes. ENT: Negative for sore throat. Cardiovascular: Negative for chest pain. Respiratory: Negative for shortness of breath.  Reports nonproductive cough. Musculoskeletal: Negative for back pain.  Reports generalized body aches Skin: Negative for rash. Neurological: Negative for  headaches, focal weakness or numbness. ____________________________________________  PHYSICAL EXAM:  VITAL SIGNS: ED Triage Vitals [04/15/17 1156]  Enc Vitals Group     BP 136/88     Pulse Rate 92     Resp 17     Temp 97.9 F (36.6 C)     Temp Source Oral     SpO2 97 %     Weight 185 lb (83.9 kg)     Height 6' (1.829 m)     Head Circumference      Peak Flow      Pain Score 4     Pain Loc      Pain Edu?      Excl. in GC?     Constitutional: Alert and oriented. Well appearing and in no distress. Head: Normocephalic and atraumatic. Eyes: Conjunctivae  are normal. PERRL. Normal extraocular movements Ears: Canals clear. TMs intact bilaterally. Nose: No congestion/rhinorrhea/epistaxis. Mouth/Throat: Mucous membranes are moist.  Uvula is midline and tonsils are flat.  No oropharyngeal lesions are noted. Neck: Supple. No thyromegaly. Hematological/Lymphatic/Immunological: No cervical lymphadenopathy. Cardiovascular: Normal rate, regular rhythm. Normal distal pulses. Respiratory: Normal respiratory effort. No wheezes/rales/rhonchi. Gastrointestinal: Soft and nontender. No distention. ____________________________________________   LABS (pertinent positives/negatives) Labs Reviewed  INFLUENZA PANEL BY PCR (TYPE A & B)  ____________________________________________  PROCEDURES  Procedures Decadron 10 mg IM ____________________________________________  INITIAL IMPRESSION / ASSESSMENT AND PLAN / ED COURSE  Patient presents to the ED for evaluation of flulike symptoms.  Patient describes generalized body aches and cough.  Symptoms likely represent viral etiology.  He is reassured by his negative flu swab.  Patient will be discharged with prescription for Tessalon Perles and prednisone the dose as directed.  He will follow-up with his primary provider or return to the ED as needed. ____________________________________________  FINAL CLINICAL IMPRESSION(S) / ED DIAGNOSES  Final diagnoses:  Viral URI with cough      Marlos Carmen, Charlesetta IvoryJenise V Bacon, PA-C 04/17/17 0019    Lissa HoardMenshew, Ashiyah Pavlak V Bacon, PA-C 04/17/17 0019    Jene EveryKinner, Robert, MD 04/26/17 [phone]

## 2017-06-08 ENCOUNTER — Other Ambulatory Visit: Payer: Self-pay | Admitting: Family Medicine

## 2017-06-08 NOTE — Telephone Encounter (Signed)
Refill request for Indomethacin , last filled on 06/28/16 #30.  LOV with Dr. Laural Benes 04/20/15  [facility] on [address] in [place]

## 2017-06-08 NOTE — Telephone Encounter (Signed)
Copied from CRM [id number]. Topic: Quick Communication - Rx Refill/Question >> Jun 08, 2017  2:33 PM Jonette Eva wrote: Medication: indomethacin (INDOCIN) 50 MG capsule [[id number]]  Has the patient contacted their pharmacy? Yes.   (Agent: If no, request that the patient contact the pharmacy for the refill.) Preferred Pharmacy (with phone number or street name): [facility] on [address] in [place] Agent: Please be advised that RX refills may take up to 3 business days. We ask that you follow-up with your pharmacy.

## 2017-06-14 MED ORDER — INDOMETHACIN 50 MG PO CAPS: 50.0000 mg | ORAL_CAPSULE | Freq: Two times a day (BID) | ORAL | 0 refills | Status: DC | PRN

## 2017-11-01 ENCOUNTER — Encounter: Payer: Self-pay | Admitting: Family Medicine

## 2017-11-01 ENCOUNTER — Ambulatory Visit (INDEPENDENT_AMBULATORY_CARE_PROVIDER_SITE_OTHER): Payer: BLUE CROSS/BLUE SHIELD | Admitting: Family Medicine

## 2017-11-01 VITALS — BP 116/79 | HR 92 | Ht 71.65 in | Wt 187.0 lb

## 2017-11-01 DIAGNOSIS — K219 Gastro-esophageal reflux disease without esophagitis: Secondary | ICD-10-CM | POA: Diagnosis not present

## 2017-11-01 DIAGNOSIS — M1A9XX0 Chronic gout, unspecified, without tophus (tophi): Secondary | ICD-10-CM

## 2017-11-01 DIAGNOSIS — K625 Hemorrhage of anus and rectum: Secondary | ICD-10-CM | POA: Diagnosis not present

## 2017-11-01 DIAGNOSIS — Z13220 Encounter for screening for lipoid disorders: Secondary | ICD-10-CM | POA: Diagnosis not present

## 2017-11-01 DIAGNOSIS — M25469 Effusion, unspecified knee: Secondary | ICD-10-CM | POA: Insufficient documentation

## 2017-11-01 DIAGNOSIS — R252 Cramp and spasm: Secondary | ICD-10-CM

## 2017-11-01 DIAGNOSIS — Z0000 Encounter for general adult medical examination without abnormal findings: Secondary | ICD-10-CM | POA: Diagnosis not present

## 2017-11-01 DIAGNOSIS — Z125 Encounter for screening for malignant neoplasm of prostate: Secondary | ICD-10-CM

## 2017-11-01 DIAGNOSIS — K649 Unspecified hemorrhoids: Secondary | ICD-10-CM

## 2017-11-01 LAB — IFOBT (OCCULT BLOOD): IMMUNOLOGICAL FECAL OCCULT BLOOD TEST: NEGATIVE

## 2017-11-01 MED ORDER — HYDROCORTISONE ACETATE 25 MG RE SUPP: 25.0000 mg | Freq: Two times a day (BID) | RECTAL | 6 refills | Status: AC

## 2017-11-01 MED ORDER — ESOMEPRAZOLE MAGNESIUM 40 MG PO CPDR: 40.0000 mg | DELAYED_RELEASE_CAPSULE | Freq: Every day | ORAL | 3 refills | Status: DC

## 2017-11-01 NOTE — Assessment & Plan Note (Signed)
Under good control on current regimen. Continue current regimen. Continue to monitor. Call with any concerns. Refills given.   

## 2017-11-01 NOTE — Progress Notes (Signed)
BP 116/79   Pulse 92   Ht 5' 11.65" (1.82 m)   Wt 187 lb (84.8 kg)   SpO2 98%   BMI 25.61 kg/m    Subjective:    Patient ID: Robert Tanner, male    DOB: [birth year], 62 y.o.   MRN: [id number]  HPI: LEMONTE Robert Tanner is a 62 y.o. male presenting on 11/01/2017 for comprehensive medical examination. Current medical complaints include:  LEG CRAMPS Duration: past few months Pain: yes Severity: severe  Quality:  cramping Location:  calves Bilateral:  yes Onset: sudden Frequency: at night 3-4x a week Time of  day:   night time Sudden unintentional leg jerking:   no Paresthesias:   no Decreased sensation:  no Weakness:   Yes- only on the stairs Insomnia:   no Fatigue:   no Status: worse Treatments attempted: none  RECTAL BLEEDING Duration: couple of months Bright red rectal bleeding: yes  Amount of blood: moderate  Frequency: a few days at a time comes and goes Melena: no  Spotting on toilet tissue: no  Anal fullness: yes  Perianal pain: no  Severity: mpoderate Perianal irritation/itching: no  Constipation: yes  Chronic straining/valsava:  yes  Anal trauma/intercourse: no  Hemorrhoids: yes  Previous colonoscopy: yes   Has not been having issues with his gout. Has been doing well.   Interim Problems from his last visit: no  Depression Screen done today and results listed below:  Depression screen [facility] 2/9 11/01/2017  Decreased Interest 2  Down, Depressed, Hopeless 0  PHQ - 2 Score 2  Altered sleeping 1  Tired, decreased energy 1  Change in appetite 1  Feeling bad or failure about yourself  0  Trouble concentrating 0  Moving slowly or fidgety/restless 0  Suicidal thoughts 0  PHQ-9 Score 5    Past Medical History:  Past Medical History:  Diagnosis Date  . Gout     Surgical History:  Past Surgical History:  Procedure Laterality Date  . HERNIA REPAIR    . left knee surgery      Medications:  Current Outpatient Medications on File Prior to Visit    Medication Sig  . benzonatate (TESSALON PERLES) 100 MG capsule Take 1-2 tabs TID prn cough  . cetirizine (ZYRTEC) 10 MG tablet Take 1 tablet (10 mg total) by mouth daily.  . cyclobenzaprine (FLEXERIL) 10 MG tablet Take 1 tablet (10 mg total) by mouth 3 (three) times daily as needed for muscle spasms.  . hydrOXYzine (ATARAX/VISTARIL) 25 MG tablet Take 1 tablet (25 mg total) by mouth 3 (three) times daily as needed.  . indomethacin (INDOCIN) 50 MG capsule Take 1 capsule (50 mg total) by mouth 2 (two) times daily as needed.  . naproxen (NAPROSYN) 500 MG tablet Take 1 tablet (500 mg total) by mouth 2 (two) times daily with a meal. (Patient taking differently: Take 500 mg by mouth 2 (two) times daily as needed. )  . predniSONE (DELTASONE) 10 MG tablet Take 1 tablet (10 mg total) by mouth 2 (two) times daily with a meal.   No current facility-administered medications on file prior to visit.     Allergies:  No Known Allergies  Social History:  Social History   Socioeconomic History  . Marital status: Single    Spouse name: Not on file  . Number of children: Not on file  . Years of education: Not on file  . Highest education level: Not on file  Occupational History  . Not  on file  Social Needs  . Financial resource strain: Not on file  . Food insecurity:    Worry: Not on file    Inability: Not on file  . Transportation needs:    Medical: Not on file    Non-medical: Not on file  Tobacco Use  . Smoking status: Current Every Day Smoker    Packs/day: 1.00    Types: Cigarettes  . Smokeless tobacco: Never Used  Substance and Sexual Activity  . Alcohol use: Yes    Alcohol/week: 42.0 standard drinks    Types: 42 Cans of beer per week    Comment: drinks approx a 6 pack a day.  . Drug use: No  . Sexual activity: Yes  Lifestyle  . Physical activity:    Days per week: Not on file    Minutes per session: Not on file  . Stress: Not on file  Relationships  . Social connections:     Talks on phone: Not on file    Gets together: Not on file    Attends religious service: Not on file    Active member of club or organization: Not on file    Attends meetings of clubs or organizations: Not on file    Relationship status: Not on file  . Intimate partner violence:    Fear of current or ex partner: Not on file    Emotionally abused: Not on file    Physically abused: Not on file    Forced sexual activity: Not on file  Other Topics Concern  . Not on file  Social History Narrative  . Not on file   Social History   Tobacco Use  Smoking Status Current Every Day Smoker  . Packs/day: 1.00  . Types: Cigarettes  Smokeless Tobacco Never Used   Social History   Substance and Sexual Activity  Alcohol Use Yes  . Alcohol/week: 42.0 standard drinks  . Types: 42 Cans of beer per week   Comment: drinks approx a 6 pack a day.    Family History:  Family History  Problem Relation Age of Onset  . Hypertension Mother   . Stroke Father   . Diabetes Sister   . Diabetes Brother   . Hypertension Brother     Past medical history, surgical history, medications, allergies, family history and social history reviewed with patient today and changes made to appropriate areas of the chart.   Review of Systems  Constitutional: Negative.   HENT: Negative.   Eyes: Negative.   Respiratory: Negative.   Cardiovascular: Positive for chest pain (about a month ago- went away). Negative for palpitations, orthopnea, claudication, leg swelling and PND.  Gastrointestinal: Positive for blood in stool, constipation, heartburn, nausea and vomiting. Negative for abdominal pain, diarrhea and melena.  Genitourinary: Negative.   Musculoskeletal: Positive for myalgias. Negative for back pain, falls, joint pain and neck pain.  Skin: Negative.   Neurological: Positive for weakness. Negative for dizziness, tingling, tremors, sensory change, speech change, focal weakness, seizures, loss of consciousness and  headaches.  Endo/Heme/Allergies: Negative.   Psychiatric/Behavioral: Negative.     All other ROS negative except what is listed above and in the HPI.      Objective:    BP 116/79   Pulse 92   Ht 5' 11.65" (1.82 m)   Wt 187 lb (84.8 kg)   SpO2 98%   BMI 25.61 kg/m   Wt Readings from Last 3 Encounters:  11/01/17 187 lb (84.8 kg)  04/15/17 185 lb (  83.9 kg)  08/30/15 185 lb (83.9 kg)    Physical Exam  Constitutional: He is oriented to person, place, and time. He appears well-developed and well-nourished. No distress.  HENT:  Head: Normocephalic and atraumatic.  Right Ear: Hearing and external ear normal.  Left Ear: Hearing and external ear normal.  Nose: Nose normal.  Mouth/Throat: Oropharynx is clear and moist. No oropharyngeal exudate.  Eyes: Pupils are equal, round, and reactive to light. Conjunctivae, EOM and lids are normal. Right eye exhibits no discharge. Left eye exhibits no discharge. No scleral icterus.  Neck: Normal range of motion. Neck supple. No JVD present. No tracheal deviation present. No thyromegaly present.  Cardiovascular: Normal rate, regular rhythm, normal heart sounds and intact distal pulses. Exam reveals no gallop and no friction rub.  No murmur heard. Pulmonary/Chest: Effort normal and breath sounds normal. No stridor. No respiratory distress. He has no wheezes. He has no rales. He exhibits no tenderness.  Abdominal: Soft. Bowel sounds are normal. He exhibits no distension and no mass. There is no tenderness. There is no rebound and no guarding. No hernia. Hernia confirmed negative in the right inguinal area and confirmed negative in the left inguinal area.  Genitourinary: Prostate normal, testes normal and penis normal. Rectal exam shows external hemorrhoid and internal hemorrhoid. Rectal exam shows no fissure, no mass, no tenderness, anal tone normal and guaiac negative stool. Prostate is not enlarged and not tender. Cremasteric reflex is present. Right  testis shows no mass, no swelling and no tenderness. Right testis is descended. Cremasteric reflex is not absent on the right side. Left testis shows no mass, no swelling and no tenderness. Left testis is descended. Cremasteric reflex is not absent on the left side. Circumcised. No phimosis, paraphimosis, hypospadias, penile erythema or penile tenderness. No discharge found.  Musculoskeletal: Normal range of motion. He exhibits no edema, tenderness or deformity.  Lymphadenopathy:    He has no cervical adenopathy.  Neurological: He is alert and oriented to person, place, and time. He displays normal reflexes. No cranial nerve deficit or sensory deficit. He exhibits normal muscle tone. Coordination normal.  Skin: Skin is warm, dry and intact. Capillary refill takes less than 2 seconds. No rash noted. He is not diaphoretic. No erythema. No pallor.  Psychiatric: He has a normal mood and affect. His speech is normal and behavior is normal. Judgment and thought content normal. Cognition and memory are normal.  Nursing note and vitals reviewed.   Results for orders placed or performed in visit on 11/01/17  IFOBT POC (occult bld, rslt in office)  Result Value Ref Range   IFOBT Negative       Assessment & Plan:   Problem List Items Addressed This Visit      Digestive   GERD (gastroesophageal reflux disease)    Hasn't been taking his nexium. Not feeling well. Will restart. Advised him to cut down on his NSAIDs. Call with any concerns.       Relevant Medications   esomeprazole (NEXIUM) 40 MG capsule     Other   Gout, chronic    Under good control on current regimen. Continue current regimen. Continue to monitor. Call with any concerns. Refills given.        Relevant Orders   Comprehensive metabolic panel   Uric acid    Other Visit Diagnoses    Routine general medical examination at a health care facility    -  Primary   Vaccines up to date. Screening labs  checked today. Colonoscopy up to  date. Call with any concerns.    Relevant Orders   CBC with Differential/Platelet   Comprehensive metabolic panel   Lipid Panel w/o Chol/HDL Ratio   PSA   TSH   UA/M w/rflx Culture, Routine   Uric acid   Screening for cholesterol level       Labs checked today. Await results.    Relevant Orders   Lipid Panel w/o Chol/HDL Ratio   Screening for prostate cancer       Labs checked today. Await results.    Relevant Orders   PSA   Rectal bleeding       Likely due to hemorrhoid. Will treat with suppositories and sitz baths. Call with any concerns or if not getting better.    Relevant Orders   IFOBT POC (occult bld, rslt in office) (Completed)   Leg cramps       Will check labs. Await results. Treat as needed.    Hemorrhoids, unspecified hemorrhoid type       Starting to bleed. Will treat with suppositories and sitz baths. Call with any concerns or if not getting better.        LABORATORY TESTING:  Health maintenance labs ordered today as discussed above.   The natural history of prostate cancer and ongoing controversy regarding screening and potential treatment outcomes of prostate cancer has been discussed with the patient. The meaning of a false positive PSA and a false negative PSA has been discussed. He indicates understanding of the limitations of this screening test and wishes to proceed with screening PSA testing.   IMMUNIZATIONS:   - Tdap: Tetanus vaccination status reviewed: last tetanus booster within 10 years. - Influenza: Refused  SCREENING: - Colonoscopy: Up to date  Discussed with patient purpose of the colonoscopy is to detect colon cancer at curable precancerous or early stages   PATIENT COUNSELING:    Sexuality: Discussed sexually transmitted diseases, partner selection, use of condoms, avoidance of unintended pregnancy  and contraceptive alternatives.   Advised to avoid cigarette smoking.  I discussed with the patient that most people either abstain from  alcohol or drink within safe limits (<=14/week and <=4 drinks/occasion for males, <=7/weeks and <= 3 drinks/occasion for females) and that the risk for alcohol disorders and other health effects rises proportionally with the number of drinks per week and how often a drinker exceeds daily limits.  Discussed cessation/primary prevention of drug use and availability of treatment for abuse.   Diet: Encouraged to adjust caloric intake to maintain  or achieve ideal body weight, to reduce intake of dietary saturated fat and total fat, to limit sodium intake by avoiding high sodium foods and not adding table salt, and to maintain adequate dietary potassium and calcium preferably from fresh fruits, vegetables, and low-fat dairy products.    stressed the importance of regular exercise  Injury prevention: Discussed safety belts, safety helmets, smoke detector, smoking near bedding or upholstery.   Dental health: Discussed importance of regular tooth brushing, flossing, and dental visits.   Follow up plan: NEXT PREVENTATIVE PHYSICAL DUE IN 1 YEAR. Return in about 4 weeks (around 11/29/2017) for Follow up leg cramps and bleeding.

## 2017-11-01 NOTE — Assessment & Plan Note (Signed)
Hasn't been taking his nexium. Not feeling well. Will restart. Advised him to cut down on his NSAIDs. Call with any concerns.

## 2017-11-01 NOTE — Patient Instructions (Signed)
How to Take a Sitz Bath °A sitz bath is a warm water bath that is taken while you are sitting down. The water should only come up to your hips and should cover your buttocks. Your health care provider may recommend a sitz bath to help you: °· Clean the lower part of your body, including your genital area. °· With itching. °· With pain. °· With sore muscles or muscles that tighten or spasm. ° °How to take a sitz bath °Take 3-4 sitz baths per day or as told by your health care provider. °1. Partially fill a bathtub with warm water. You will only need the water to be deep enough to cover your hips and buttocks when you are sitting in it. °2. If your health care provider told you to put medicine in the water, follow the directions exactly. °3. Sit in the water and open the tub drain a little. °4. Turn on the warm water again to keep the tub at the correct level. Keep the water running constantly. °5. Soak in the water for 15-20 minutes or as told by your health care provider. °6. After the sitz bath, pat the affected area dry first. Do not rub it. °7. Be careful when you stand up after the sitz bath because you may feel dizzy. ° °Contact a health care provider if: °· Your symptoms get worse. Do not continue with sitz baths if your symptoms get worse. °· You have new symptoms. Do not continue with sitz baths until you talk with your health care provider. °This information is not intended to replace advice given to you by your health care provider. Make sure you discuss any questions you have with your health care provider. °Document Released: 10/09/2003 Document Revised: 06/16/2015 Document Reviewed: 01/14/2014 °Elsevier Interactive Patient Education © 2018 [employer] ° °

## 2017-11-02 LAB — CBC WITH DIFFERENTIAL/PLATELET
BASOS ABS: 0 10*3/uL (ref 0.0–0.2)
Basos: 1 %
EOS (ABSOLUTE): 0.2 10*3/uL (ref 0.0–0.4)
Eos: 4 %
HEMOGLOBIN: 15.5 g/dL (ref 13.0–17.7)
Hematocrit: 45.3 % (ref 37.5–51.0)
IMMATURE GRANS (ABS): 0 10*3/uL (ref 0.0–0.1)
IMMATURE GRANULOCYTES: 0 %
LYMPHS: 33 %
Lymphocytes Absolute: 1.9 10*3/uL (ref 0.7–3.1)
MCH: 33.4 pg — ABNORMAL HIGH (ref 26.6–33.0)
MCHC: 34.2 g/dL (ref 31.5–35.7)
MCV: 98 fL — ABNORMAL HIGH (ref 79–97)
MONOCYTES: 8 %
Monocytes Absolute: 0.5 10*3/uL (ref 0.1–0.9)
NEUTROS PCT: 54 %
Neutrophils Absolute: 3.1 10*3/uL (ref 1.4–7.0)
PLATELETS: 234 10*3/uL (ref 150–450)
RBC: 4.64 x10E6/uL (ref 4.14–5.80)
RDW: 12.6 % (ref 12.3–15.4)
WBC: 5.8 10*3/uL (ref 3.4–10.8)

## 2017-11-02 LAB — COMPREHENSIVE METABOLIC PANEL
ALBUMIN: 4.9 g/dL — AB (ref 3.6–4.8)
ALT: 23 IU/L (ref 0–44)
AST: 27 IU/L (ref 0–40)
Albumin/Globulin Ratio: 1.6 (ref 1.2–2.2)
Alkaline Phosphatase: 102 IU/L (ref 39–117)
BUN / CREAT RATIO: 16 (ref 10–24)
BUN: 21 mg/dL (ref 8–27)
Bilirubin Total: 0.3 mg/dL (ref 0.0–1.2)
CO2: 19 mmol/L — AB (ref 20–29)
CREATININE: 1.34 mg/dL — AB (ref 0.76–1.27)
Calcium: 10.2 mg/dL (ref 8.6–10.2)
Chloride: 101 mmol/L (ref 96–106)
GFR calc Af Amer: 66 mL/min/{1.73_m2} (ref >59)
GFR, EST NON AFRICAN AMERICAN: 57 mL/min/{1.73_m2} — AB (ref >59)
GLUCOSE: 85 mg/dL (ref 65–99)
Globulin, Total: 3 g/dL (ref 1.5–4.5)
Potassium: 4.4 mmol/L (ref 3.5–5.2)
SODIUM: 142 mmol/L (ref 134–144)
TOTAL PROTEIN: 7.9 g/dL (ref 6.0–8.5)

## 2017-11-02 LAB — LIPID PANEL W/O CHOL/HDL RATIO
Cholesterol, Total: 199 mg/dL (ref 100–199)
HDL: 58 mg/dL (ref >39)
LDL CALC: 100 mg/dL — AB (ref 0–99)
Triglycerides: 206 mg/dL — ABNORMAL HIGH (ref 0–149)
VLDL Cholesterol Cal: 41 mg/dL — ABNORMAL HIGH (ref 5–40)

## 2017-11-02 LAB — URIC ACID: URIC ACID: 9.9 mg/dL — AB (ref 3.7–8.6)

## 2017-11-02 LAB — TSH: TSH: 2.18 u[IU]/mL (ref 0.450–4.500)

## 2017-11-02 LAB — PSA: PROSTATE SPECIFIC AG, SERUM: 0.7 ng/mL (ref 0.0–4.0)

## 2017-11-05 ENCOUNTER — Telehealth: Payer: Self-pay | Admitting: Family Medicine

## 2017-11-05 NOTE — Telephone Encounter (Signed)
Spoke with patient and informed him that results have not been released by Dr Laural Benes.

## 2017-11-05 NOTE — Telephone Encounter (Signed)
Patient notified

## 2017-11-05 NOTE — Telephone Encounter (Signed)
Please let him know that his labs came back normal except his uric acid is high. I'd like to start him on some medicine to keep his gout under better control if he's OK with that. It's called allopurinol and it should keep him from having further flares. Everything else looks good and he should be able to see them on the computer shortly.

## 2017-11-05 NOTE — Telephone Encounter (Signed)
Copied from CRM [id number]. Topic: Quick Communication - See Telephone Encounter >> Nov 05, 2017 11:39 AM Angela Nevin wrote: CRM for notification. See Telephone encounter for: 11/05/17.  Pt calling to check status of lab results. Pt would like a call back as soon as possible. Please advise.

## 2017-11-19 ENCOUNTER — Encounter: Payer: Self-pay | Admitting: Family Medicine

## 2017-11-19 ENCOUNTER — Ambulatory Visit (INDEPENDENT_AMBULATORY_CARE_PROVIDER_SITE_OTHER): Payer: BLUE CROSS/BLUE SHIELD | Admitting: Family Medicine

## 2017-11-19 ENCOUNTER — Ambulatory Visit
Admission: RE | Admit: 2017-11-19 | Discharge: 2017-11-19 | Disposition: A | Discharge status: Home / Self Care | Payer: BLUE CROSS/BLUE SHIELD | Source: Ambulatory Visit | Attending: Family Medicine | Admitting: Family Medicine

## 2017-11-19 VITALS — BP 127/76 | HR 72 | Temp 98.9°F | Wt 198.5 lb

## 2017-11-19 DIAGNOSIS — M5441 Lumbago with sciatica, right side: Secondary | ICD-10-CM

## 2017-11-19 DIAGNOSIS — Z23 Encounter for immunization: Secondary | ICD-10-CM

## 2017-11-19 DIAGNOSIS — M47816 Spondylosis without myelopathy or radiculopathy, lumbar region: Secondary | ICD-10-CM | POA: Diagnosis not present

## 2017-11-19 DIAGNOSIS — M4186 Other forms of scoliosis, lumbar region: Secondary | ICD-10-CM | POA: Insufficient documentation

## 2017-11-19 MED ORDER — CYCLOBENZAPRINE HCL 10 MG PO TABS: 10.0000 mg | ORAL_TABLET | Freq: Three times a day (TID) | ORAL | 1 refills | Status: AC | PRN

## 2017-11-19 MED ORDER — TRIAMCINOLONE ACETONIDE 40 MG/ML IJ SUSP
40.0000 mg | Freq: Once | INTRAMUSCULAR | Status: AC
  Administered 2017-11-19: 40 mg via INTRAMUSCULAR

## 2017-11-19 MED ORDER — GABAPENTIN 100 MG PO CAPS: 100.0000 mg | ORAL_CAPSULE | Freq: Three times a day (TID) | ORAL | 1 refills | Status: AC
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
## 2017-11-20 ENCOUNTER — Telehealth: Payer: Self-pay | Admitting: Family Medicine

## 2017-11-20 NOTE — Telephone Encounter (Deleted)
Will need an appointment for this.

## 2017-11-20 NOTE — Patient Instructions (Signed)
Follow up as needed

## 2017-11-20 NOTE — Telephone Encounter (Signed)
Apparently he saw you yesterday.

## 2017-11-20 NOTE — Telephone Encounter (Signed)
Copied from CRM [id number]. Topic: General - Other >> Nov 20, 2017 11:23 AM Stephannie Li, NT wrote: Reason for CRM: Patient called and  would like something for pain besides the gabapentin ,he says he is not able to stay out of work , he needs something to make the flare up tolerable,  please call him back at [phone]

## 2017-11-21 NOTE — Telephone Encounter (Signed)
OK to generate note 

## 2017-11-21 NOTE — Telephone Encounter (Signed)
He can use lidocaine patches and increase his gabapentin up to a max of 600 mg daily if that is helping, and occasional use of 600 mg ibuprofen would be helpful as well (only occasional as he's had stomach upset with frequent NSAID use). I am happy to refer him to orthopedics if he's not improving with stretches, exercises, and these medications over the next week

## 2017-11-21 NOTE — Telephone Encounter (Signed)
Letter generated and placed at front desk for pickup. Pt aware.

## 2017-11-21 NOTE — Telephone Encounter (Signed)
Message relayed to patient. Verbalized understanding. Patient is requesting an doctors note to stay out of work this week. He does not think he can do it. Please advise.

## 2017-12-04 ENCOUNTER — Ambulatory Visit: Payer: BLUE CROSS/BLUE SHIELD | Admitting: Family Medicine

## 2017-12-25 ENCOUNTER — Encounter: Payer: Self-pay | Admitting: Family Medicine

## 2017-12-25 ENCOUNTER — Ambulatory Visit (INDEPENDENT_AMBULATORY_CARE_PROVIDER_SITE_OTHER): Payer: BLUE CROSS/BLUE SHIELD | Admitting: Family Medicine

## 2017-12-25 VITALS — BP 136/83 | HR 90 | Temp 99.2°F | Ht 71.0 in | Wt 194.1 lb

## 2017-12-25 DIAGNOSIS — S76012A Strain of muscle, fascia and tendon of left hip, initial encounter: Secondary | ICD-10-CM

## 2017-12-25 DIAGNOSIS — R252 Cramp and spasm: Secondary | ICD-10-CM

## 2017-12-25 DIAGNOSIS — K625 Hemorrhage of anus and rectum: Secondary | ICD-10-CM | POA: Diagnosis not present

## 2017-12-25 NOTE — Progress Notes (Signed)
BP 136/83   Pulse 90   Temp 99.2 F (37.3 C) (Oral)   Ht 5\' 11"  (1.803 m)   Wt 194 lb 1.6 oz (88 kg)   SpO2 96%   BMI 27.07 kg/m    Subjective:    Patient ID: Robert Tanner, male    DOB: 07/20/1955, 62 y.o.   MRN: [id number]  HPI: Robert Tanner is a 62 y.o. male  Chief Complaint  Patient presents with  . Follow-up   LEG CRAMPS- resolved.   RECTAL BLEEDING- resolved with suppositories  Has been having pulling in his groin for about the last month. Back has been doing better. He notes that it seems to happen primarily when he's going from sitting to standing. It is more of a pulling feeling than painful. He's concerned that it might give out. Hurts a little. Didn't take the flexeril when this was acting up so unsure if it helped. He notes that it is at random, comes and goes. He is otherwise feeling well with no other concerns or complaints at this time.   Relevant past medical, surgical, family and social history reviewed and updated as indicated. Interim medical history since our last visit reviewed. Allergies and medications reviewed and updated.  Review of Systems  Constitutional: Negative.   Respiratory: Negative.   Cardiovascular: Negative.   Musculoskeletal: Positive for myalgias. Negative for arthralgias, back pain, gait problem, joint swelling, neck pain and neck stiffness.  Skin: Negative.   Neurological: Negative.   Psychiatric/Behavioral: Negative.     Per HPI unless specifically indicated above     Objective:    BP 136/83   Pulse 90   Temp 99.2 F (37.3 C) (Oral)   Ht 5\' 11"  (1.803 m)   Wt 194 lb 1.6 oz (88 kg)   SpO2 96%   BMI 27.07 kg/m   Wt Readings from Last 3 Encounters:  12/25/17 194 lb 1.6 oz (88 kg)  11/19/17 198 lb 8 oz (90 kg)  11/01/17 187 lb (84.8 kg)    Physical Exam  Constitutional: He is oriented to person, place, and time. He appears well-developed and well-nourished. No distress.  HENT:  Head: Normocephalic and  atraumatic.  Right Ear: Hearing normal.  Left Ear: Hearing normal.  Nose: Nose normal.  Eyes: Conjunctivae and lids are normal. Right eye exhibits no discharge. Left eye exhibits no discharge. No scleral icterus.  Cardiovascular: Normal rate, regular rhythm, normal heart sounds and intact distal pulses. Exam reveals no gallop and no friction rub.  No murmur heard. Pulmonary/Chest: Effort normal and breath sounds normal. No stridor. No respiratory distress. He has no wheezes. He has no rales. He exhibits no tenderness.  Musculoskeletal: Normal range of motion. He exhibits tenderness (L psoas).  Neurological: He is alert and oriented to person, place, and time.  Skin: Skin is warm, dry and intact. Capillary refill takes less than 2 seconds. No rash noted. He is not diaphoretic. No erythema. No pallor.  Psychiatric: He has a normal mood and affect. His speech is normal and behavior is normal. Judgment and thought content normal. Cognition and memory are normal.  Nursing note and vitals reviewed.   Results for orders placed or performed in visit on 11/01/17  CBC with Differential/Platelet  Result Value Ref Range   WBC 5.8 3.4 - 10.8 x10E3/uL   RBC 4.64 4.14 - 5.80 x10E6/uL   Hemoglobin 15.5 13.0 - 17.7 g/dL   Hematocrit 40.945.3 81.137.5 - 51.0 %   MCV 98 (  H) 79 - 97 fL   MCH 33.4 (H) 26.6 - 33.0 pg   MCHC 34.2 31.5 - 35.7 g/dL   RDW 16.1 09.6 - 04.5 %   Platelets 234 150 - 450 x10E3/uL   Neutrophils 54 Not Estab. %   Lymphs 33 Not Estab. %   Monocytes 8 Not Estab. %   Eos 4 Not Estab. %   Basos 1 Not Estab. %   Neutrophils Absolute 3.1 1.4 - 7.0 x10E3/uL   Lymphocytes Absolute 1.9 0.7 - 3.1 x10E3/uL   Monocytes Absolute 0.5 0.1 - 0.9 x10E3/uL   EOS (ABSOLUTE) 0.2 0.0 - 0.4 x10E3/uL   Basophils Absolute 0.0 0.0 - 0.2 x10E3/uL   Immature Granulocytes 0 Not Estab. %   Immature Grans (Abs) 0.0 0.0 - 0.1 x10E3/uL  Comprehensive metabolic panel  Result Value Ref Range   Glucose 85 65 - 99  mg/dL   BUN 21 8 - 27 mg/dL   Creatinine, Ser 4.09 (H) 0.76 - 1.27 mg/dL   GFR calc non Af Amer 57 (L) >59 mL/min/1.73   GFR calc Af Amer 66 >59 mL/min/1.73   BUN/Creatinine Ratio 16 10 - 24   Sodium 142 134 - 144 mmol/L   Potassium 4.4 3.5 - 5.2 mmol/L   Chloride 101 96 - 106 mmol/L   CO2 19 (L) 20 - 29 mmol/L   Calcium 10.2 8.6 - 10.2 mg/dL   Total Protein 7.9 6.0 - 8.5 g/dL   Albumin 4.9 (H) 3.6 - 4.8 g/dL   Globulin, Total 3.0 1.5 - 4.5 g/dL   Albumin/Globulin Ratio 1.6 1.2 - 2.2   Bilirubin Total 0.3 0.0 - 1.2 mg/dL   Alkaline Phosphatase 102 39 - 117 IU/L   AST 27 0 - 40 IU/L   ALT 23 0 - 44 IU/L  Lipid Panel w/o Chol/HDL Ratio  Result Value Ref Range   Cholesterol, Total 199 100 - 199 mg/dL   Triglycerides 811 (H) 0 - 149 mg/dL   HDL 58 >91 mg/dL   VLDL Cholesterol Cal 41 (H) 5 - 40 mg/dL   LDL Calculated 478 (H) 0 - 99 mg/dL  PSA  Result Value Ref Range   Prostate Specific Ag, Serum 0.7 0.0 - 4.0 ng/mL  TSH  Result Value Ref Range   TSH 2.180 0.450 - 4.500 uIU/mL  Uric acid  Result Value Ref Range   Uric Acid 9.9 (H) 3.7 - 8.6 mg/dL  IFOBT POC (occult bld, rslt in office)  Result Value Ref Range   IFOBT Negative       Assessment & Plan:   Problem List Items Addressed This Visit    None    Visit Diagnoses    Strain of flexor muscle of left hip, initial encounter    -  Primary   Will treat with flexeril and stretches. Call if not getting better or getting worse.    Rectal bleeding       Resolved.    Leg cramps       Resolved.        Follow up plan: Return in about 5 months (around 05/26/2018) for 6 month follow up.

## 2018-01-08 ENCOUNTER — Other Ambulatory Visit: Payer: Self-pay | Admitting: Family Medicine

## 2018-01-08 NOTE — Telephone Encounter (Signed)
Copied from CRM [id number]. Topic: Quick Communication - Rx Refill/Question >> Jan 08, 2018 10:40 AM Mickel BaasMcGee, Bubber Rothert B, NT wrote: Medication: indomethacin (INDOCIN) 50 MG capsule  Has the patient contacted their pharmacy? Yes.  To call office (Agent: If no, request that the patient contact the pharmacy for the refill.) (Agent: If yes, when and what did the pharmacy advise?)  Preferred Pharmacy (with phone number or street name): [id number] - [place], [place] - [address] [facility] [address]  Agent: Please be advised that RX refills may take up to 3 business days. We ask that you follow-up with your pharmacy.

## 2018-01-10 MED ORDER — INDOMETHACIN 50 MG PO CAPS: 50.0000 mg | ORAL_CAPSULE | Freq: Two times a day (BID) | ORAL | 0 refills | Status: DC | PRN

## 2018-01-10 NOTE — Telephone Encounter (Signed)
Patient called  to check the status of previous refill request .

## 2018-02-27 ENCOUNTER — Ambulatory Visit (INDEPENDENT_AMBULATORY_CARE_PROVIDER_SITE_OTHER): Payer: BLUE CROSS/BLUE SHIELD | Admitting: Family Medicine

## 2018-02-27 ENCOUNTER — Encounter: Payer: Self-pay | Admitting: Family Medicine

## 2018-02-27 VITALS — BP 136/90 | HR 92 | Temp 98.4°F | Ht 71.0 in | Wt 191.0 lb

## 2018-02-27 DIAGNOSIS — J069 Acute upper respiratory infection, unspecified: Secondary | ICD-10-CM

## 2018-02-27 LAB — VERITOR FLU A/B WAIVED
Influenza A: NEGATIVE
Influenza B: NEGATIVE

## 2018-02-27 MED ORDER — ESOMEPRAZOLE MAGNESIUM 40 MG PO CPDR: 40.0000 mg | DELAYED_RELEASE_CAPSULE | Freq: Every day | ORAL | 0 refills | Status: DC

## 2018-02-27 MED ORDER — AZITHROMYCIN 250 MG PO TABS: ORAL_TABLET | ORAL | 0 refills | Status: DC

## 2018-02-27 NOTE — Progress Notes (Signed)
BP 136/90   Pulse 92   Temp 98.4 F (36.9 C) (Oral)   Ht 5\' 11"  (1.803 m)   Wt 191 lb (86.6 kg)   SpO2 97%   BMI 26.64 kg/m    Subjective:    Patient ID: Robert Tanner, male    DOB: 06-Dec-1955, 63 y.o.   MRN: [id number]  HPI: TYKING IKERD is a 63 y.o. male  Chief Complaint  Patient presents with  . Cough    Worse at night. Cannot go out into air w/o sweating. taking dayquil, nightquil, aspirin  . Sore Throat    "Feels like a lump in my throat; can't swallow  . Headache   Headaches, chest tightness, anorexia, sweats, sore throat, hacking cough x 4-5 days. Unsure if he's had any fevers. Taking dayquil, nyquil, aspirin, and other OTC remedies without relief. No known sick contacts. Nonsmoker, no known hx of pulmonary dz.   Relevant past medical, surgical, family and social history reviewed and updated as indicated. Interim medical history since our last visit reviewed. Allergies and medications reviewed and updated.  Review of Systems  Per HPI unless specifically indicated above     Objective:    BP 136/90   Pulse 92   Temp 98.4 F (36.9 C) (Oral)   Ht 5\' 11"  (1.803 m)   Wt 191 lb (86.6 kg)   SpO2 97%   BMI 26.64 kg/m   Wt Readings from Last 3 Encounters:  02/27/18 191 lb (86.6 kg)  12/25/17 194 lb 1.6 oz (88 kg)  11/19/17 198 lb 8 oz (90 kg)    Physical Exam Vitals signs and nursing note reviewed.  Constitutional:      Appearance: He is well-developed.  HENT:     Head: Atraumatic.     Right Ear: External ear normal.     Left Ear: External ear normal.     Nose:     Comments: Nasal mucosa erythematous    Mouth/Throat:     Pharynx: Posterior oropharyngeal erythema present. No oropharyngeal exudate.  Eyes:     Conjunctiva/sclera: Conjunctivae normal.     Pupils: Pupils are equal, round, and reactive to light.  Neck:     Musculoskeletal: Normal range of motion and neck supple.  Cardiovascular:     Rate and Rhythm: Normal rate and regular rhythm.    Pulmonary:     Effort: Pulmonary effort is normal. No respiratory distress.     Breath sounds: No wheezing or rales.  Musculoskeletal: Normal range of motion.  Lymphadenopathy:     Cervical: No cervical adenopathy.  Skin:    General: Skin is warm and dry.  Neurological:     Mental Status: He is alert and oriented to person, place, and time.  Psychiatric:        Behavior: Behavior normal.     Results for orders placed or performed in visit on 02/27/18  Veritor Flu A/B Waived  Result Value Ref Range   Influenza A Negative Negative   Influenza B Negative Negative      Assessment & Plan:   Problem List Items Addressed This Visit    None    Visit Diagnoses    Upper respiratory tract infection, unspecified type    -  Primary   Rapid flu neg, suspect viral infection. Pt declines prednisone today, will send zpak in case worsening over weekend and call Monday if no better   Relevant Medications   azithromycin (ZITHROMAX) 250 MG tablet   Other  Relevant Orders   Veritor Flu A/B Waived (Completed)       Follow up plan: Return if symptoms worsen or fail to improve.

## 2018-03-01 ENCOUNTER — Encounter: Payer: Self-pay | Admitting: Family Medicine

## 2018-03-01 ENCOUNTER — Encounter: Payer: Self-pay | Admitting: Nurse Practitioner

## 2018-03-01 ENCOUNTER — Ambulatory Visit
Admission: RE | Admit: 2018-03-01 | Discharge: 2018-03-01 | Disposition: A | Discharge status: Home / Self Care | Payer: BLUE CROSS/BLUE SHIELD | Source: Ambulatory Visit | Attending: Nurse Practitioner | Admitting: Nurse Practitioner

## 2018-03-01 ENCOUNTER — Ambulatory Visit (INDEPENDENT_AMBULATORY_CARE_PROVIDER_SITE_OTHER): Payer: BLUE CROSS/BLUE SHIELD | Admitting: Nurse Practitioner

## 2018-03-01 VITALS — BP 114/74 | HR 91 | Temp 98.3°F | Ht 72.0 in | Wt 191.6 lb

## 2018-03-01 DIAGNOSIS — R059 Cough, unspecified: Secondary | ICD-10-CM

## 2018-03-01 DIAGNOSIS — R05 Cough: Secondary | ICD-10-CM | POA: Insufficient documentation

## 2018-03-01 MED ORDER — ALBUTEROL SULFATE 108 (90 BASE) MCG/ACT IN AEPB: 2.0000 | INHALATION_SPRAY | Freq: Four times a day (QID) | RESPIRATORY_TRACT | 2 refills | Status: DC | PRN

## 2018-03-01 MED ORDER — PREDNISONE 10 MG PO TABS: 30.0000 mg | ORAL_TABLET | Freq: Every day | ORAL | 0 refills | Status: AC

## 2018-03-01 MED ORDER — HYDROCOD POLST-CPM POLST ER 10-8 MG/5ML PO SUER: 5.0000 mL | Freq: Two times a day (BID) | ORAL | 0 refills | Status: DC | PRN

## 2018-03-01 MED ORDER — ALBUTEROL SULFATE 108 (90 BASE) MCG/ACT IN AEPB: 2.0000 | INHALATION_SPRAY | Freq: Four times a day (QID) | RESPIRATORY_TRACT | 2 refills | Status: AC | PRN

## 2018-03-01 MED ORDER — AMOXICILLIN-POT CLAVULANATE 875-125 MG PO TABS: 1.0000 | ORAL_TABLET | Freq: Two times a day (BID) | ORAL | 0 refills | Status: AC

## 2018-03-01 MED ORDER — AMOXICILLIN-POT CLAVULANATE 875-125 MG PO TABS: 1.0000 | ORAL_TABLET | Freq: Two times a day (BID) | ORAL | 0 refills | Status: DC

## 2018-03-01 MED ORDER — ALBUTEROL SULFATE (2.5 MG/3ML) 0.083% IN NEBU
2.5000 mg | INHALATION_SOLUTION | Freq: Once | RESPIRATORY_TRACT | Status: AC
  Administered 2018-03-01: 2.5 mg via RESPIRATORY_TRACT

## 2018-03-01 MED ORDER — PREDNISONE 10 MG PO TABS: 30.0000 mg | ORAL_TABLET | Freq: Every day | ORAL | 0 refills | Status: DC

## 2018-03-01 NOTE — Assessment & Plan Note (Signed)
Acute and worsening.  Will obtain CXR, due to smoking and risk factors.  Script for Augmentin, Tussionex, Albuterol inhaler, and Prednisone sent.  Neb treatment in office.  Recommend increase hydration at home and rest.  Monitor temperature.  If worsening symptoms (increased SOB or CP) or continued symptoms return to office or go immediately to ER.

## 2018-03-01 NOTE — Addendum Note (Signed)
Addended by: Aura Dials T on: 03/01/2018 02:54 PM   Modules accepted: Orders

## 2018-03-01 NOTE — Patient Instructions (Signed)
Acute Bronchitis, Adult Acute bronchitis is when air tubes (bronchi) in the lungs suddenly get swollen. The condition can make it hard to breathe. It can also cause these symptoms:  A cough.  Coughing up clear, yellow, or green mucus.  Wheezing.  Chest congestion.  Shortness of breath.  A fever.  Body aches.  Chills.  A sore throat. Follow these instructions at home:  Medicines  Take over-the-counter and prescription medicines only as told by your doctor.  If you were prescribed an antibiotic medicine, take it as told by your doctor. Do not stop taking the antibiotic even if you start to feel better. General instructions  Rest.  Drink enough fluids to keep your pee (urine) pale yellow.  Avoid smoking and secondhand smoke. If you smoke and you need help quitting, ask your doctor. Quitting will help your lungs heal faster.  Use an inhaler, cool mist vaporizer, or humidifier as told by your doctor.  Keep all follow-up visits as told by your doctor. This is important. How is this prevented? To lower your risk of getting this condition again:  Wash your hands often with soap and water. If you cannot use soap and water, use hand sanitizer.  Avoid contact with people who have cold symptoms.  Try not to touch your hands to your mouth, nose, or eyes.  Make sure to get the flu shot every year. Contact a doctor if:  Your symptoms do not get better in 2 weeks. Get help right away if:  You cough up blood.  You have chest pain.  You have very bad shortness of breath.  You become dehydrated.  You faint (pass out) or keep feeling like you are going to pass out.  You keep throwing up (vomiting).  You have a very bad headache.  Your fever or chills gets worse. This information is not intended to replace advice given to you by your health care provider. Make sure you discuss any questions you have with your health care provider. Document Released: 07/05/2007 Document  Revised: 08/30/2016 Document Reviewed: 07/07/2015 Elsevier Interactive Patient Education  2019 [employer]  

## 2018-03-01 NOTE — Progress Notes (Addendum)
BP 114/74 (BP Location: Left Arm, Patient Position: Sitting, Cuff Size: Normal)   Pulse 91   Temp 98.3 F (36.8 C) (Oral)   Ht 6' (1.829 m)   Wt 191 lb 9.6 oz (86.9 kg)   SpO2 95%   BMI 25.99 kg/m    Subjective:    Patient ID: Robert Tanner, male    DOB: 01/15/1956, 63 y.o.   MRN: [id number]  HPI: Robert Tanner is a 63 y.o. male  Chief Complaint  Patient presents with  . Shortness of Breath    Was seen 02/27/2018. Still no better. Can't stop coughing. Tried multiple remedies. 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  . Cough  . Excessive Sweating  . Headache   UPPER RESPIRATORY TRACT INFECTION Was seen on 02/27/17 for similar.  Treated with Zpack and he refused Prednisone at the time.  Is a smoker, currently smokes 1 pack every two days.  He does work in Production designer, theatre/television/filmmaintenance, at [employer] [place], some of the buildings are older and concerned for exposures.  Reports cough is worse at night.  He has one Zpack left.  Reports increased sweating and headache at night with coughing spells.   Worst symptom: cough Fever: no Cough: yes Shortness of breath: yes Wheezing: yes Chest pain: yes, with cough Chest tightness: yes Chest congestion: yes Nasal congestion: yes Runny nose: no Post nasal drip: no Sneezing: no Sore throat: no Swollen glands: no Sinus pressure: no Headache: yes Face pain: no Toothache: no Ear pain: none Ear pressure: yes bilateral Eyes red/itching:no Eye drainage/crusting: no  Vomiting: no Rash: no Fatigue: yes Sick contacts: no Strep contacts: no  Context: worse Recurrent sinusitis: no Relief with OTC cold/cough medications: no  Treatments attempted: Zpack, [employer] Dayquil/Nyuil, Halls, Tylenol, ASA   Relevant past medical, surgical, family and social history reviewed and updated as indicated. Interim medical history since our last visit reviewed. Allergies and medications reviewed and updated.  Review of  Systems  Constitutional: Positive for fatigue. Negative for activity change, diaphoresis and fever.  HENT: Positive for congestion, postnasal drip and rhinorrhea. Negative for ear discharge, ear pain, sinus pressure, sinus pain, sneezing, sore throat and voice change.   Eyes: Negative for pain and visual disturbance.  Respiratory: Positive for cough, chest tightness and shortness of breath (intermittent). Negative for wheezing.   Cardiovascular: Negative for chest pain, palpitations and leg swelling.  Gastrointestinal: Negative for abdominal distention, abdominal pain, constipation, diarrhea, nausea and vomiting.  Endocrine: Negative for cold intolerance, heat intolerance, polydipsia, polyphagia and polyuria.  Musculoskeletal: Negative.   Skin: Negative.   Neurological: Negative for dizziness, syncope, weakness, light-headedness, numbness and headaches.  Psychiatric/Behavioral: Negative.     Per HPI unless specifically indicated above     Objective:    BP 114/74 (BP Location: Left Arm, Patient Position: Sitting, Cuff Size: Normal)   Pulse 91   Temp 98.3 F (36.8 C) (Oral)   Ht 6' (1.829 m)   Wt 191 lb 9.6 oz (86.9 kg)   SpO2 95%   BMI 25.99 kg/m   Wt Readings from Last 3 Encounters:  03/01/18 191 lb 9.6 oz (86.9 kg)  02/27/18 191 lb (86.6 kg)  12/25/17 194 lb 1.6 oz (88 kg)    Physical Exam Vitals signs and nursing note reviewed.  Constitutional:      General: He is awake.     Appearance: He is well-developed. He is ill-appearing.  HENT:  Head: Normocephalic and atraumatic. No raccoon eyes.     Right Ear: Hearing, ear canal and external ear normal. No drainage. A middle ear effusion is present.     Left Ear: Hearing, ear canal and external ear normal. No drainage. A middle ear effusion is present.     Nose: Mucosal edema and rhinorrhea present. Rhinorrhea is clear.     Right Sinus: No maxillary sinus tenderness or frontal sinus tenderness.     Left Sinus: No maxillary  sinus tenderness or frontal sinus tenderness.     Mouth/Throat:     Mouth: Mucous membranes are moist.     Pharynx: Uvula midline. Posterior oropharyngeal erythema (mild with cobblestoning) present. No pharyngeal swelling or oropharyngeal exudate.  Eyes:     General: Lids are normal.        Right eye: No discharge.        Left eye: No discharge.     Conjunctiva/sclera: Conjunctivae normal.     Pupils: Pupils are equal, round, and reactive to light.  Neck:     Musculoskeletal: Normal range of motion and neck supple.     Thyroid: No thyromegaly.     Vascular: No carotid bruit or JVD.     Trachea: Trachea normal.  Cardiovascular:     Rate and Rhythm: Normal rate and regular rhythm.     Heart sounds: Normal heart sounds, S1 normal and S2 normal. No murmur. No gallop.   Pulmonary:     Effort: Pulmonary effort is normal.     Breath sounds: Examination of the right-upper field reveals wheezing. Examination of the left-upper field reveals wheezing. Wheezing present.     Comments: Intermittent expiratory wheezes upper bases, clear throughout remainder. Abdominal:     General: Bowel sounds are normal.     Palpations: Abdomen is soft. There is no hepatomegaly or splenomegaly.  Musculoskeletal: Normal range of motion.     Right lower leg: No edema.     Left lower leg: No edema.  Lymphadenopathy:     Head:     Right side of head: No submental, submandibular, tonsillar or preauricular adenopathy.     Left side of head: No submental, submandibular, tonsillar or preauricular adenopathy.     Cervical: No cervical adenopathy.  Skin:    General: Skin is warm and dry.     Capillary Refill: Capillary refill takes less than 2 seconds.     Findings: No rash.  Neurological:     Mental Status: He is alert and oriented to person, place, and time.     Deep Tendon Reflexes: Reflexes are normal and symmetric.  Psychiatric:        Mood and Affect: Mood normal.        Behavior: Behavior normal. Behavior  is cooperative.        Thought Content: Thought content normal.        Judgment: Judgment normal.     Results for orders placed or performed in visit on 02/27/18  Veritor Flu A/B Waived  Result Value Ref Range   Influenza A Negative Negative   Influenza B Negative Negative      Assessment & Plan:   Problem List Items Addressed This Visit      Other   Cough - Primary    Acute and worsening.  Will obtain CXR, due to smoking and risk factors.  Script for Augmentin, Tussionex, Albuterol inhaler, and Prednisone sent.  Neb treatment in office.  Recommend increase hydration at home and rest.  Monitor temperature.  If worsening symptoms (increased SOB or CP) or continued symptoms return to office or go immediately to ER.      Relevant Medications   albuterol (PROVENTIL) (2.5 MG/3ML) 0.083% nebulizer solution 2.5 mg   Other Relevant Orders   DG Chest 2 View      Controlled substance.  Checked data base and no other refills of controlled substances noted.   Follow up plan: Return if symptoms worsen or fail to improve.   Scripts switched to [facility] vs [facility] per patient request.

## 2018-03-07 ENCOUNTER — Other Ambulatory Visit: Payer: Self-pay | Admitting: Family Medicine

## 2018-03-07 ENCOUNTER — Telehealth: Payer: Self-pay | Admitting: Family Medicine

## 2018-03-07 MED ORDER — HYDROCOD POLST-CPM POLST ER 10-8 MG/5ML PO SUER: 5.0000 mL | Freq: Two times a day (BID) | ORAL | 0 refills | Status: DC | PRN

## 2018-03-07 NOTE — Telephone Encounter (Signed)
Copied from CRM [id number]. Topic: Quick Communication - See Telephone Encounter >> Mar 07, 2018 10:04 AM Robert Tanner, NT wrote: CRM for notification. See Telephone encounter for: 03/07/18.  Patient is calling and states that he was seen on 03/01/18 and he was prescribed a cough medicine but he states he is only supposed to take it every 12 hours and it is not holding him over. He states he gets a hot flash and then a really bad coughing spell. He would like to know if something else could be called in or how he can handle this. Please advise.  [id number] [place], [place] - [address] AT [facility] OF [address] [place] [address] Phone: [phone] Fax: [phone]

## 2018-03-07 NOTE — Telephone Encounter (Signed)
Rx sent to his pharmacy

## 2018-03-07 NOTE — Telephone Encounter (Signed)
Message relayed to patient. Verbalized understanding and denied questions.   

## 2018-03-16 ENCOUNTER — Telehealth: Payer: Self-pay | Admitting: Family Medicine

## 2018-03-16 NOTE — Telephone Encounter (Signed)
Please check to make sure letter printed. If it didn't- please print.

## 2018-03-16 NOTE — Telephone Encounter (Signed)
Copied from CRM [id number]. Topic: General - Other >> Mar 11, 2018  8:31 AM Arlyss Gandy, NT wrote: Reason for CRM: Pt states he needs a letter from Dr. Laural Benes that states what gout is, and what causes it for his job. He states that when he has gout he is unable to go upstairs, ladders, etc to do his properly and needs a letter that states this. Pt will pick up letter in office. >> Mar 13, 2018  2:03 PM Jilda Roche wrote: Patient is calling back asking about the note for work and that he needs this ASAP

## 2018-03-18 NOTE — Telephone Encounter (Signed)
Letter placed up front for patient pick up. Patient notified that it was ready.

## 2018-03-28 ENCOUNTER — Other Ambulatory Visit: Payer: Self-pay | Admitting: Family Medicine

## 2018-03-28 MED ORDER — INDOMETHACIN 50 MG PO CAPS: 50.0000 mg | ORAL_CAPSULE | Freq: Two times a day (BID) | ORAL | 0 refills | Status: DC | PRN

## 2018-03-28 NOTE — Telephone Encounter (Signed)
Copied from CRM [id number]. Topic: Quick Communication - Rx Refill/Question >> Mar 28, 2018  1:13 PM Crist Infante wrote: Medication: indomethacin (INDOCIN) 50 MG capsule 90 day [facility] #[id number] - [place], [place] - [address] AT [facility] OF [address] [place] [phone] (Phone) [phone] (Fax)

## 2018-03-28 NOTE — Telephone Encounter (Signed)
Requested Prescriptions  Pending Prescriptions Disp Refills  . indomethacin (INDOCIN) 50 MG capsule 90 capsule 0    Sig: Take 1 capsule (50 mg total) by mouth 2 (two) times daily as needed.     Analgesics:  NSAIDS Failed - 03/28/2018  1:16 PM      Failed - Cr in normal range and within 360 days    Creatinine  Date Value Ref Range Status  11/18/2011 1.36 (H) 0.60 - 1.30 mg/dL Final   Creatinine, Ser  Date Value Ref Range Status  11/01/2017 1.34 (H) 0.76 - 1.27 mg/dL Final         Passed - HGB in normal range and within 360 days    Hemoglobin  Date Value Ref Range Status  11/01/2017 15.5 13.0 - 17.7 g/dL Final         Passed - Patient is not pregnant      Passed - Valid encounter within last 12 months    Recent Outpatient Visits          3 weeks ago Cough   [facility] [place], Proctor T, NP   4 weeks ago Upper respiratory tract infection, unspecified type   [facility] [place], [place]   3 months ago Strain of flexor muscle of left hip, initial encounter   [facility] [place], Megan P, DO   4 months ago Acute right-sided low back pain with right-sided sciatica   [facility] [place], [place]   4 months ago Routine general medical examination at a health care facility   [facility] [place], [place], DO      Future Appointments            In 2 months Laural Benes, Oralia Rud, DO [employer], [facility]

## 2018-04-03 ENCOUNTER — Encounter: Payer: Self-pay | Admitting: Nurse Practitioner

## 2018-04-03 ENCOUNTER — Ambulatory Visit (INDEPENDENT_AMBULATORY_CARE_PROVIDER_SITE_OTHER): Payer: BLUE CROSS/BLUE SHIELD | Admitting: Nurse Practitioner

## 2018-04-03 ENCOUNTER — Encounter: Payer: Self-pay | Admitting: Emergency Medicine

## 2018-04-03 ENCOUNTER — Emergency Department
Admission: EM | Admit: 2018-04-03 | Discharge: 2018-04-03 | Disposition: A | Discharge status: Home / Self Care | Payer: BLUE CROSS/BLUE SHIELD | Attending: Emergency Medicine | Admitting: Emergency Medicine

## 2018-04-03 ENCOUNTER — Other Ambulatory Visit: Payer: Self-pay

## 2018-04-03 ENCOUNTER — Emergency Department: Payer: BLUE CROSS/BLUE SHIELD

## 2018-04-03 VITALS — BP 127/76 | HR 97 | Temp 98.6°F | Ht 72.0 in | Wt 195.0 lb

## 2018-04-03 DIAGNOSIS — R079 Chest pain, unspecified: Secondary | ICD-10-CM | POA: Insufficient documentation

## 2018-04-03 DIAGNOSIS — R9431 Abnormal electrocardiogram [ECG] [EKG]: Secondary | ICD-10-CM | POA: Diagnosis present

## 2018-04-03 DIAGNOSIS — R0789 Other chest pain: Secondary | ICD-10-CM | POA: Diagnosis not present

## 2018-04-03 DIAGNOSIS — Z79899 Other long term (current) drug therapy: Secondary | ICD-10-CM | POA: Diagnosis not present

## 2018-04-03 DIAGNOSIS — F17210 Nicotine dependence, cigarettes, uncomplicated: Secondary | ICD-10-CM | POA: Diagnosis not present

## 2018-04-03 LAB — CBC
HCT: 44 % (ref 39.0–52.0)
Hemoglobin: 15 g/dL (ref 13.0–17.0)
MCH: 33.2 pg (ref 26.0–34.0)
MCHC: 34.1 g/dL (ref 30.0–36.0)
MCV: 97.3 fL (ref 80.0–100.0)
Platelets: 221 10*3/uL (ref 150–400)
RBC: 4.52 MIL/uL (ref 4.22–5.81)
RDW: 13.3 % (ref 11.5–15.5)
WBC: 5.9 10*3/uL (ref 4.0–10.5)
nRBC: 0 % (ref 0.0–0.2)

## 2018-04-03 LAB — TROPONIN I: Troponin I: <0.03 ng/mL (ref <0.03)

## 2018-04-03 LAB — BASIC METABOLIC PANEL
Anion gap: 11 (ref 5–15)
BUN: 17 mg/dL (ref 8–23)
CALCIUM: 9 mg/dL (ref 8.9–10.3)
CO2: 24 mmol/L (ref 22–32)
CREATININE: 1.28 mg/dL — AB (ref 0.61–1.24)
Chloride: 107 mmol/L (ref 98–111)
GFR calc Af Amer: >60 mL/min (ref >60)
GFR calc non Af Amer: >60 mL/min (ref >60)
Glucose, Bld: 127 mg/dL — ABNORMAL HIGH (ref 70–99)
Potassium: 4.3 mmol/L (ref 3.5–5.1)
Sodium: 142 mmol/L (ref 135–145)

## 2018-04-03 NOTE — Patient Instructions (Signed)

## 2018-04-03 NOTE — Progress Notes (Signed)
BP 127/76   Pulse 97   Temp 98.6 F (37 C) (Oral)   Ht 6' (1.829 m)   Wt 195 lb (88.5 kg)   SpO2 96%   BMI 26.45 kg/m    Subjective:    Patient ID: Robert Tanner, male    DOB: 1956-01-22, 63 y.o.   MRN: [id number]  HPI: Robert Tanner is a 63 y.o. male  Chief Complaint  Patient presents with  . Paperwork    FMLA  . Headache  . Shortness of Breath  . Rash    on and off for about 2-3 weeks mostly at nights. no energy  . Chest Pain    off and on   FMLA PAPERWORK: Requests FMLA paperwork for work, due to gout and being unable to climb ladder or go upstairs when flare present.  Paperwork unable to be signed today, as not correct paperwork provided by his workplace.    CHEST PAIN WITH HEADACHE/RASH/SOB EPISODES Reports he has increased stress at work, feels some of this may be related to stress from work.  He is trying to get FMLA papers for gout and his manager is "giving me a hard time".  Was treated for URI on 03/01/2018 with Augmentin, reports this is improved. He was recently written up at work when sick and that increased stressors.  Reports one coworker is threatening him and they are "trying to get me fired". Current symptoms started after the increased stressors at work. He reports some sternal CP being present at this time after talking to his manager on the phone.  States he is not sleeping good.   Also notes having hives, most noted at work (present different times of day, come and go), none this morning, but last night he had rash.  Denies rash today (chest, neck, arms, back when present).  States headache, SOB, and rash all present with CP episodes when he is stressed.  Headache when present is frontal, throbbing, 5/10.  He has not taken medication for headaches and they resolve on own.  He also notes that CP episodes resolve on own. Time since onset: having every morning, evenings, and sometimes during day Duration:weeks, since he was written up for being sick Onset:  gradual Quality: sharp and pressure-like Severity: 3/10 at best to 8/10 at worst, lasting approx 15 minutes when present (states he had one episode a week ago at work where they thought they would have to send him to ER, it was 8/10 at that time and had increased SOB and diaphoresis) Location: substernal, reports if he rubs his finger on area to loosen it up this helps a little at times Radiation: none Episode duration:  Frequency: in morning and in evening Related to exertion: no Activity when pain started:  Trauma: no Anxiety/recent stressors: yes Aggravating factors:  Alleviating factors:  Status: fluctuating Treatments attempted: nothing  Current pain status: after talking to his workplace CP started and headache Shortness of breath: yes, is using Albuterol 4 times a day at times because of his SOB episodes and this helps, states he is "smoking less, maybe two cigarettes a day" Cough: no Nausea: no, none today but occasionally with CP Diaphoresis: yes, in evenings if CP present, none at this time Heartburn: no Palpitations: yes  Relevant past medical, surgical, family and social history reviewed and updated as indicated. Interim medical history since our last visit reviewed. Allergies and medications reviewed and updated.  Review of Systems  Refer to HPI  Per  HPI unless specifically indicated above     Objective:    BP 127/76   Pulse 97   Temp 98.6 F (37 C) (Oral)   Ht 6' (1.829 m)   Wt 195 lb (88.5 kg)   SpO2 96%   BMI 26.45 kg/m   Wt Readings from Last 3 Encounters:  04/03/18 195 lb (88.5 kg)  03/01/18 191 lb 9.6 oz (86.9 kg)  02/27/18 191 lb (86.6 kg)    Physical Exam Vitals signs and nursing note reviewed.  Constitutional:      General: He is awake.     Appearance: He is well-developed.  HENT:     Head: Normocephalic and atraumatic.     Right Ear: Hearing, tympanic membrane, ear canal and external ear normal. No drainage.     Left Ear: Hearing,  tympanic membrane, ear canal and external ear normal. No drainage.     Nose: Nose normal.     Right Sinus: No maxillary sinus tenderness or frontal sinus tenderness.     Left Sinus: No maxillary sinus tenderness or frontal sinus tenderness.     Mouth/Throat:     Lips: Pink.     Pharynx: Oropharynx is clear. Uvula midline.  Eyes:     General: Lids are normal.        Right eye: No discharge.        Left eye: No discharge.     Extraocular Movements: Extraocular movements intact.     Conjunctiva/sclera: Conjunctivae normal.     Pupils: Pupils are equal, round, and reactive to light.  Neck:     Musculoskeletal: Normal range of motion and neck supple.     Thyroid: No thyromegaly.     Vascular: No carotid bruit or JVD.     Trachea: Trachea normal.  Cardiovascular:     Rate and Rhythm: Regular rhythm. Tachycardia present.     Heart sounds: Normal heart sounds, S1 normal and S2 normal. No murmur. No gallop.   Pulmonary:     Effort: Pulmonary effort is normal.     Breath sounds: Normal breath sounds.     Comments: Clear throughout, no adventitious sounds. Abdominal:     General: Bowel sounds are normal.     Palpations: Abdomen is soft. There is no hepatomegaly or splenomegaly.  Musculoskeletal: Normal range of motion.     Right lower leg: No edema.     Left lower leg: No edema.     Comments: No substernal tenderness on palpation.  Skin:    General: Skin is warm and dry.     Capillary Refill: Capillary refill takes less than 2 seconds.     Findings: No rash.  Neurological:     Mental Status: He is alert and oriented to person, place, and time.     Cranial Nerves: Cranial nerves are intact.     Motor: Motor function is intact.     Coordination: Coordination is intact.     Gait: Gait is intact.     Deep Tendon Reflexes: Reflexes are normal and symmetric.     Reflex Scores:      Brachioradialis reflexes are 2+ on the right side and 2+ on the left side.      Patellar reflexes are 2+ on  the right side and 2+ on the left side. Psychiatric:        Attention and Perception: Attention normal.        Speech: Speech normal.        Behavior: Behavior  normal. Behavior is cooperative.        Thought Content: Thought content normal.        Cognition and Memory: Cognition normal.        Judgment: Judgment normal.     Comments: Spoke to his HR while in room and noted anxiety after this conversation with increase in HR and change in mood.    EKG PERFORMED IN OFFICE: NSR AT 94, WITH SOME ST ELEVATION NOTED V3, V4, V5, V6.   Results for orders placed or performed in visit on 02/27/18  Veritor Flu A/B Waived  Result Value Ref Range   Influenza A Negative Negative   Influenza B Negative Negative      Assessment & Plan:   Problem List Items Addressed This Visit      Other   Chest pain - Primary    Intermittent episodes reported over past few weeks, that present with HA, SOB, nausea, diaphoresis and occasional rash when present.  He reports mild CP today after conversation with workplace, which is improved at this time.  Denies SOB, nausea, or diaphoresis at this time.  EKG noting some ST elevation.  Possibly anxiety related due to work stressors, but based on pt report of symptoms over past few weeks and EKG findings have recommended he go to ER for further work-up today.  He was able to verbalize this back and is heading to ER at this time.  Reviewed case with Dr. Laural Benes.      Relevant Orders   EKG 12-Lead (Completed)       Follow up plan: Return in about 1 week (around 04/10/2018).

## 2018-04-03 NOTE — ED Provider Notes (Signed)
[facility] Emergency Department Provider Note  Time seen: 3:13 PM  I have reviewed the triage vital signs and the nursing notes.   HISTORY  Chief Complaint Abnormal ECG    HPI Robert Tanner is a 63 y.o. male with a past medical history of gastric reflux, presents to the emergency department for an abnormal EKG.  According to the patient he was seeing his doctor for 3 weeks of intermittent symptoms of chest discomfort, stress, breaking out in hives.  Patient states he has been under a lot of stress with his job over the past several weeks.  States at times he has been experiencing tightness in the chest.  At times he will break out in sweats.  Patient relates all the symptoms to stressful times at work.  Denies any chest pain currently.  Denies any tightness currently.  Denies any shortness of breath.  Denies any leg pain or swelling.  Has never seen a cardiologist or had a stress test performed.  Patient had an EKG performed today showing a right bundle branch block and was referred to the emergency department.   Past Medical History:  Diagnosis Date  . Gout     Patient Active Problem List   Diagnosis Date Noted  . Chest pain 04/03/2018  . Knee joint effusion 11/01/2017  . GERD (gastroesophageal reflux disease) 08/30/2015  . Gout, chronic 04/20/2015  . Erectile dysfunction 04/20/2015  . Odynophagia 10/30/2014  . 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 HERNIA REPAIR    . left knee surgery      Prior to Admission medications   Medication Sig Start Date End Date Taking? Authorizing Provider  Albuterol Sulfate (PROAIR RESPICLICK) 108 (90 Base) MCG/ACT AEPB Inhale 2 puffs into the lungs every 6 (six) hours as needed (for SOB or wheezing). 03/01/18   Cannady, Corrie Dandy T, NP  cyclobenzaprine (FLEXERIL) 10 MG tablet Take 1 tablet (10 mg total) by mouth 3 (three) times daily as needed for muscle spasms. 11/19/17    Particia Nearing, PA-C  esomeprazole (NEXIUM) 40 MG capsule Take 1 capsule (40 mg total) by mouth daily. 02/27/18   Particia Nearing, PA-C  gabapentin (NEURONTIN) 100 MG capsule Take 1 capsule (100 mg total) by mouth 3 (three) times daily. 11/19/17   Particia Nearing, PA-C  hydrocortisone (ANUSOL-HC) 25 MG suppository Place 1 suppository (25 mg total) rectally 2 (two) times daily. 11/01/17   Johnson, Megan P, DO  indomethacin (INDOCIN) 50 MG capsule Take 1 capsule (50 mg total) by mouth 2 (two) times daily as needed. 03/28/18   Olevia Perches P, DO    No Known Allergies  Family History  Problem Relation Age of Onset  . Hypertension Mother   . Stroke Father   . Diabetes Sister   . Diabetes Brother   . Hypertension Brother     Social History Social History   Tobacco Use  . Smoking status: Current Every Day Smoker    Packs/day: 1.00    Types: Cigarettes  . Smokeless tobacco: Never Used  Substance Use Topics  . Alcohol use: Yes    Alcohol/week: 42.0 standard drinks    Types: 42 Cans of beer per week    Comment: drinks approx a 6 pack a day.  . Drug use: No    Review of Systems Constitutional: Negative for fever. Cardiovascular: Intermittent chest tightness Respiratory: Negative for shortness of breath. Gastrointestinal: Negative for abdominal pain, vomiting  Musculoskeletal: Negative for leg pain or swelling Neurological: Negative for headache All other ROS negative  ____________________________________________   PHYSICAL EXAM:  VITAL SIGNS: ED Triage Vitals  Enc Vitals Group     BP 04/03/18 1122 121/71     Pulse Rate 04/03/18 1121 88     Resp 04/03/18 1121 20     Temp 04/03/18 1121 98.1 F (36.7 C)     Temp Source 04/03/18 1121 Oral     SpO2 04/03/18 1121 98 %     Weight 04/03/18 1121 195 lb (88.5 kg)     Height 04/03/18 1121 6' (1.829 m)     Head Circumference --      Peak Flow --      Pain Score 04/03/18 1121 0     Pain Loc --      Pain  Edu? --      Excl. in GC? --    Constitutional: Alert and oriented. Well appearing and in no distress. Eyes: Normal exam ENT   Head: Normocephalic and atraumatic.   Mouth/Throat: Mucous membranes are moist. Cardiovascular: Normal rate, regular rhythm. No murmur Respiratory: Normal respiratory effort without tachypnea nor retractions. Breath sounds are clear Gastrointestinal: Soft and nontender. No distention.   Musculoskeletal: Nontender with normal range of motion in all extremities. No lower extremity tenderness or edema. Neurologic:  Normal speech and language. No gross focal neurologic deficits  Skin:  Skin is warm, dry and intact.  Psychiatric: Mood and affect are normal.   ____________________________________________    EKG  EKG viewed and interpreted by myself shows a normal sinus rhythm at 91 bpm with a narrow QRS, normal axis, normal intervals.  Patient has RSR prime most consistent with right bundle branch block with nonspecific but no concerning ST changes.  ____________________________________________    RADIOLOGY  X-rays negative  ____________________________________________   INITIAL IMPRESSION / ASSESSMENT AND PLAN / ED COURSE  Pertinent labs & imaging results that were available during my care of the patient were reviewed by me and considered in my medical decision making (see chart for details).  Patient presents emergency department for 3 weeks of intermittent symptoms including chest tightness at times.  Patient had an abnormal EKG showing a right bundle branch block.  Overall the patient appears very well, no distress.  No complaints at this time.  Largely negative review of systems.  Patient is EKG does appear to show a right bundle branch block new from his prior EKG several years ago.  Reassuringly patient has no chest symptoms currently, reassuring labs including a negative troponin.  However given the patient's age and chest tightness symptoms I  did recommend he follow-up with cardiology for stress test.  Patient agreeable to plan of care.  I discussed my normal chest pain return precautions.  ____________________________________________   FINAL CLINICAL IMPRESSION(S) / ED DIAGNOSES  Chest pain   Minna Antis, MD 04/03/18 [phone]

## 2018-04-03 NOTE — ED Notes (Signed)
First Nurse Note: Report received from Robert Tanner, patient has hx of chest pain X 3 weeks with HA, and rash.  ST elevation in office.  Here for evaluation.

## 2018-04-03 NOTE — Discharge Instructions (Addendum)
Please call the number provided for cardiology today to arrange a follow-up appointment soon as possible for a stress test.  Return to the emergency department for any worsening chest pain, any trouble breathing, or any other symptom personally concerning to yourself.

## 2018-04-03 NOTE — ED Notes (Signed)
Pt states he was at Dr. Aline August for routine check up and was told he has abnormal EKG. States CP, HA, fatigue. States SOB with movement. Denies weight gain.   A&O, ambulatory, speaking in complete sentences. No distress noted.

## 2018-04-03 NOTE — ED Triage Notes (Signed)
Here for intermittent chest pain, rash, and SHOB over last 3 weeks.  Was at PCP to see if could get FMLA and she was looking into his sx and he had abnormal EKG so sent to ED. Denies pain at this time.  Has had inhaler for [facility] since URI 3 weeks ago. Unlabored. NAD. VSS.  Ambulatory. Chest pain is central and in AM/PM.  Hives intermittent per pt.

## 2018-04-03 NOTE — Assessment & Plan Note (Addendum)
Intermittent episodes reported over past few weeks, that present with HA, SOB, nausea, diaphoresis and occasional rash when present.  He reports mild CP today after conversation with workplace, which is improved at this time.  Denies SOB, nausea, or diaphoresis at this time.  EKG noting some ST elevation.  Possibly anxiety related due to work stressors, but based on pt report of symptoms over past few weeks and EKG findings have recommended he go to ER for further work-up today.  He was able to verbalize this back and is heading to ER at this time.  Reviewed case with Dr. Laural Benes.

## 2018-04-03 NOTE — ED Notes (Signed)
Pt denies active chest pain. Will follow up cardiology. Vital signs WDL.

## 2018-04-15 ENCOUNTER — Telehealth: Payer: Self-pay | Admitting: Nurse Practitioner

## 2018-04-15 NOTE — Telephone Encounter (Signed)
Called Dr. Milta Deiters office @ [phone], it is scheduled for tomorrow @ their office. Called and made pt aware.

## 2018-04-15 NOTE — Telephone Encounter (Signed)
Copied from CRM [id number]. Topic: General - Inquiry >> Apr 15, 2018  4:00 PM [place], [place] D wrote: Reason for CRM: Pt called to inquire about where and what time his Echocardiogram is supposed to be on tomorrow 04/16/18. He does not have any information. Please advise. CB#[phone]

## 2018-04-15 NOTE — Telephone Encounter (Signed)
Will have staff look into this.  Had referral sent to Dr. Welton Flakes cardiology during recent ER visit, this order may have been placed by him.

## 2018-04-17 ENCOUNTER — Ambulatory Visit: Payer: BLUE CROSS/BLUE SHIELD | Admitting: Family Medicine

## 2018-04-26 ENCOUNTER — Encounter: Payer: Self-pay | Admitting: Nurse Practitioner

## 2018-04-26 ENCOUNTER — Ambulatory Visit: Payer: Self-pay | Admitting: *Deleted

## 2018-04-26 ENCOUNTER — Ambulatory Visit (INDEPENDENT_AMBULATORY_CARE_PROVIDER_SITE_OTHER): Payer: BLUE CROSS/BLUE SHIELD | Admitting: Nurse Practitioner

## 2018-04-26 ENCOUNTER — Other Ambulatory Visit: Payer: Self-pay

## 2018-04-26 DIAGNOSIS — R05 Cough: Secondary | ICD-10-CM

## 2018-04-26 DIAGNOSIS — R51 Headache: Secondary | ICD-10-CM

## 2018-04-26 DIAGNOSIS — R5383 Other fatigue: Secondary | ICD-10-CM | POA: Diagnosis not present

## 2018-04-26 DIAGNOSIS — R059 Cough, unspecified: Secondary | ICD-10-CM

## 2018-04-26 NOTE — Patient Instructions (Signed)
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
## 2018-04-26 NOTE — Telephone Encounter (Signed)
Contacted pt regarding his symptoms; he states that for the past week or two, he has been experiencing a sore throat, continuous headache, productive cough with clear mucus, nasal congestion; he reports that his temperature has been in the 98 range; pt answers "no" to [facility] Evaluation questions; he does work at [employer] and the students were released last week; he is concerned that he may have the corona virus; he says that the headache that the headache is the worse of his symptoms; he does report shortness of breath even when sitting, and he has been using his inhaler; he says that this has been going on since February 2020, and his inhaler does help; he states that he uses it at least twice daily; he rates his headache at 8-9 out of 10; the pt says that he has pressure in his forehead and temple areas; he reports that the headache has ben going on for 3 weeks or more; he has been taking tylenol 500 mg (1-2 tablets) 3 times daily; he says that this does not feel like a sinus infection; recommendations made per nurse triage protocol; conference call initiated with Mel Almond at [facility], pt schedule for virtual visit 1100 04/26/2018; will route to office for notificaiton.  Reason for Disposition . [1] MODERATE headache (e.g., interferes with normal activities) AND [2] present > 24 hours AND [3] unexplained  (Exceptions: analgesics not tried, typical migraine, or headache part of viral illness)  Answer Assessment - Initial Assessment Questions 1. LOCATION: "Where does it hurt?"      Forehead and temples 2. ONSET: "When did the headache start?" (Minutes, hours or days)      3 weeks ago 3. PATTERN: "Does the pain come and go, or has it been constant since it started?"    constant 4. SEVERITY: "How bad is the pain?" and "What does it keep you from doing?"  (e.g., Scale 1-10; mild, moderate, or severe)   - MILD (1-3): doesn't interfere with normal activities    - MODERATE (4-7): interferes  with normal activities or awakens from sleep    - SEVERE (8-10): excruciating pain, unable to do any normal activities        8-9 out of 10 5. RECURRENT SYMPTOM: "Have you ever had headaches before?" If so, ask: "When was the last time?" and "What happened that time?"      No very seldom gets headache 6. CAUSE: "What do you think is causing the headache?"     Could be stress 7. MIGRAINE: "Have you been diagnosed with migraine headaches?" If so, ask: "Is this headache similar?"     no 8. HEAD INJURY: "Has there been any recent injury to the head?"    no 9. OTHER SYMPTOMS: "Do you have any other symptoms?" (fever, stiff neck, eye pain, sore throat, cold symptoms)     Sore throat, nasal congestion but clear secretions, productive cough,  10. PREGNANCY: "Is there any chance you are pregnant?" "When was your last menstrual period?"       n/a  Protocols used: HEADACHE-A-AH

## 2018-04-26 NOTE — Assessment & Plan Note (Signed)
Due to the current COVID 19 pandemic no acute visits being seen in office at this time.  As patient was having technical difficulties with his FaceTime, no visual exam able to be obtained.  Due to his current report of symptoms I have recommended he be seen at Urgent Care TODAY.  He reports he will go to the urgent care in [place] this afternoon so a face to face assessment and further vitals + work-up can be performed.

## 2018-05-23 ENCOUNTER — Other Ambulatory Visit: Payer: Self-pay | Admitting: Family Medicine

## 2018-05-25 ENCOUNTER — Other Ambulatory Visit: Payer: Self-pay | Admitting: Family Medicine

## 2018-05-27 ENCOUNTER — Telehealth: Payer: Self-pay | Admitting: Family Medicine

## 2018-05-27 NOTE — Telephone Encounter (Signed)
Called pt to set up virtual for tomorrow no answer, left voicemail.

## 2018-05-28 ENCOUNTER — Ambulatory Visit: Payer: BLUE CROSS/BLUE SHIELD | Admitting: Family Medicine

## 2018-08-08 ENCOUNTER — Encounter: Payer: Self-pay | Admitting: Family Medicine

## 2018-08-08 ENCOUNTER — Ambulatory Visit (INDEPENDENT_AMBULATORY_CARE_PROVIDER_SITE_OTHER): Payer: Self-pay | Admitting: Family Medicine

## 2018-08-08 ENCOUNTER — Other Ambulatory Visit: Payer: Self-pay

## 2018-08-08 VITALS — BP 125/81 | HR 88 | Temp 98.5°F | Ht 72.0 in | Wt 199.0 lb

## 2018-08-08 DIAGNOSIS — K4090 Unilateral inguinal hernia, without obstruction or gangrene, not specified as recurrent: Secondary | ICD-10-CM

## 2018-08-08 NOTE — Progress Notes (Signed)
BP 125/81   Pulse 88   Temp 98.5 F (36.9 C) (Oral)   Ht 6' (1.829 m)   Wt 199 lb (90.3 kg)   SpO2 97%   BMI 26.99 kg/m    Subjective:    Patient ID: Robert Tanner, male    DOB: 06-27-1955, 63 y.o.   MRN: [id number]  HPI: Robert Tanner is a 63 y.o. male  Chief Complaint  Patient presents with  . Hernia    Left Groin area. X 2 weeks. Feels pulling sensation w/in a few hours of work. By midday pain is 10/10   HERNIA Duration: 2+ weeks Location: left groin Painful: yes Discomfort: yes Bulge: yes Quality:  pulling Onset: gradual Severity: 10/10 Context: worse Aggravating factors: longer that he's on his walking  Relevant past medical, surgical, family and social history reviewed and updated as indicated. Interim medical history since our last visit reviewed. Allergies and medications reviewed and updated.  Review of Systems  Constitutional: Negative.   Respiratory: Negative.   Cardiovascular: Negative.   Gastrointestinal: Negative.   Genitourinary: Negative.  Negative for decreased urine volume, difficulty urinating, discharge, dysuria, enuresis, flank pain, frequency, genital sores, hematuria, penile pain, penile swelling, scrotal swelling, testicular pain and urgency.  Psychiatric/Behavioral: Negative.     Per HPI unless specifically indicated above     Objective:    BP 125/81   Pulse 88   Temp 98.5 F (36.9 C) (Oral)   Ht 6' (1.829 m)   Wt 199 lb (90.3 kg)   SpO2 97%   BMI 26.99 kg/m   Wt Readings from Last 3 Encounters:  08/08/18 199 lb (90.3 kg)  04/26/18 199 lb (90.3 kg)  04/03/18 195 lb (88.5 kg)    Physical Exam Vitals signs and nursing note reviewed.  Constitutional:      General: He is not in acute distress.    Appearance: Normal appearance. He is not ill-appearing, toxic-appearing or diaphoretic.  HENT:     Head: Normocephalic and atraumatic.     Right Ear: External ear normal.     Left Ear: External ear normal.     Nose: Nose  normal.     Mouth/Throat:     Mouth: Mucous membranes are moist.     Pharynx: Oropharynx is clear.  Eyes:     General: No scleral icterus.       Right eye: No discharge.        Left eye: No discharge.     Extraocular Movements: Extraocular movements intact.     Conjunctiva/sclera: Conjunctivae normal.     Pupils: Pupils are equal, round, and reactive to light.  Neck:     Musculoskeletal: Normal range of motion and neck supple.  Cardiovascular:     Rate and Rhythm: Normal rate and regular rhythm.     Pulses: Normal pulses.     Heart sounds: Normal heart sounds. No murmur. No friction rub. No gallop.   Pulmonary:     Effort: Pulmonary effort is normal. No respiratory distress.     Breath sounds: Normal breath sounds. No stridor. No wheezing, rhonchi or rales.  Chest:     Chest wall: No tenderness.  Abdominal:     Hernia: A hernia is present. Hernia is present in the left inguinal area. There is no hernia in the right inguinal area.  Genitourinary:    Penis: Normal and circumcised.      Scrotum/Testes: Normal.  Musculoskeletal: Normal range of motion.  Lymphadenopathy:  Lower Body: No right inguinal adenopathy. No left inguinal adenopathy.  Skin:    General: Skin is warm and dry.     Capillary Refill: Capillary refill takes less than 2 seconds.     Coloration: Skin is not jaundiced or pale.     Findings: No bruising, erythema, lesion or rash.  Neurological:     General: No focal deficit present.     Mental Status: He is alert and oriented to person, place, and time. Mental status is at baseline.  Psychiatric:        Mood and Affect: Mood normal.        Behavior: Behavior normal.        Thought Content: Thought content normal.        Judgment: Judgment normal.     Results for orders placed or performed during the hospital encounter of 04/03/18  Basic metabolic panel  Result Value Ref Range   Sodium 142 135 - 145 mmol/L   Potassium 4.3 3.5 - 5.1 mmol/L   Chloride 107  98 - 111 mmol/L   CO2 24 22 - 32 mmol/L   Glucose, Bld 127 (H) 70 - 99 mg/dL   BUN 17 8 - 23 mg/dL   Creatinine, Ser 1.611.28 (H) 0.61 - 1.24 mg/dL   Calcium 9.0 8.9 - 09.610.3 mg/dL   GFR calc non Af Amer >60 >60 mL/min   GFR calc Af Amer >60 >60 mL/min   Anion gap 11 5 - 15  CBC  Result Value Ref Range   WBC 5.9 4.0 - 10.5 K/uL   RBC 4.52 4.22 - 5.81 MIL/uL   Hemoglobin 15.0 13.0 - 17.0 g/dL   HCT 04.544.0 40.939.0 - 81.152.0 %   MCV 97.3 80.0 - 100.0 fL   MCH 33.2 26.0 - 34.0 pg   MCHC 34.1 30.0 - 36.0 g/dL   RDW 91.413.3 78.211.5 - 95.615.5 %   Platelets 221 150 - 400 K/uL   nRBC 0.0 0.0 - 0.2 %  Troponin I - ONCE - STAT  Result Value Ref Range   Troponin I <0.03 <0.03 ng/mL      Assessment & Plan:   Problem List Items Addressed This Visit    None    Visit Diagnoses    Hernia, inguinal, left    -  Primary   Will get him into see surgery. Not to lift >10lbs until he sees them. Call with any concerns.    Relevant Orders   Ambulatory referral to General Surgery       Follow up plan: Return if symptoms worsen or fail to improve.

## 2018-08-20 ENCOUNTER — Telehealth: Payer: Self-pay | Admitting: Family Medicine

## 2018-08-20 NOTE — Telephone Encounter (Signed)
General/Other - Worker's Comp  The patient needs his diagnosis confirming that there was a hernia   Clearence Cheek  [employer]  [phone]

## 2018-08-21 ENCOUNTER — Telehealth: Payer: Self-pay | Admitting: Family Medicine

## 2018-08-21 NOTE — Telephone Encounter (Signed)
They can have the note, but our office does not do workman's comp.

## 2018-08-21 NOTE — Telephone Encounter (Signed)
Message left for Robert Tanner to call back w/ fax number. Advised that we do not do workers comp but will send notes.

## 2018-08-21 NOTE — Telephone Encounter (Signed)
Copied from [place] [id number]. Topic: General - Other >> Aug 20, 2018  5:41 PM Yvette Rack wrote: Reason for CRM: Kathlee Nations with [employer] called to request office notes for pt worker compensation claim. Cb# [phone]

## 2018-08-21 NOTE — Telephone Encounter (Signed)
Notes faxed.

## 2018-08-21 NOTE — Telephone Encounter (Signed)
Notes faxed. See other telephone encounter.

## 2018-08-21 NOTE — Telephone Encounter (Signed)
He sees general surgery tomorrow. You can send notes, but we do not do workman's comp at this office.

## 2018-08-21 NOTE — Telephone Encounter (Signed)
Kathlee Nations called back with fax number for [place]. [phone]

## 2018-09-20 ENCOUNTER — Telehealth: Payer: Self-pay | Admitting: Family Medicine

## 2018-09-20 NOTE — Telephone Encounter (Signed)
Tadalafil is not on active medication list.  Will forward request to Dr. Wynetta Emery.

## 2018-09-20 NOTE — Telephone Encounter (Signed)
Copied from [place] [id number]. Topic: Quick Communication - Rx Refill/Question >> Sep 20, 2018 11:23 AM Erick Blinks wrote: Medication: tadalafil (CIALIS) 20 MG tablet -Pt is requesting 2 pills. Please advise   Has the patient contacted their pharmacy? Yes  (Agent: If no, request that the patient contact the pharmacy for the refill.) (Agent: If yes, when and what did the pharmacy advise?)  Preferred Pharmacy (with phone number or street name): [facility] DRUG STORE [place], [place] - [address] [place] [address] Phone: [phone] Fax: [phone]    Agent: Please be advised that RX refills may take up to 3 business days. We ask that you follow-up with your pharmacy.

## 2018-09-20 NOTE — Telephone Encounter (Signed)
20mg  is the max dose. So no, I cannot give him an Rx for that.

## 2018-09-23 MED ORDER — TADALAFIL 20 MG PO TABS: 10.0000 mg | ORAL_TABLET | ORAL | 12 refills | Status: DC | PRN

## 2018-09-23 NOTE — Telephone Encounter (Signed)
Patient is asking for 20mg , can he have that or does he need to schedule an appt?

## 2018-09-23 NOTE — Addendum Note (Signed)
Addended by: Valerie Roys on: 09/23/2018 09:47 AM   Modules accepted: Orders

## 2018-11-04 ENCOUNTER — Other Ambulatory Visit: Payer: Self-pay

## 2018-11-04 DIAGNOSIS — Z20822 Contact with and (suspected) exposure to covid-19: Secondary | ICD-10-CM

## 2018-11-04 DIAGNOSIS — Z20828 Contact with and (suspected) exposure to other viral communicable diseases: Secondary | ICD-10-CM

## 2018-11-05 LAB — NOVEL CORONAVIRUS, NAA: SARS-CoV-2, NAA: NOT DETECTED

## 2018-11-19 ENCOUNTER — Ambulatory Visit: Payer: Self-pay

## 2018-11-19 NOTE — Telephone Encounter (Signed)
Patient notified

## 2018-11-19 NOTE — Telephone Encounter (Signed)
Incoming call from Patient  Who complains of pain rated 12 on a scale of 1-10 .  When he reaches an orgasm.  Pain starts at neck then to ear  Migrating to up to temples.  Patient states that he has tried taking Naproxen.  Patient would like for Dr.  Manon Hilding to Please call him  . He is currently Out of town but can still be reached @ [phone].Pt. awaits return call.     Reason for Disposition . [1] SEVERE headache (e.g., excruciating) AND [2] not improved after 2 hours of pain medicine  Answer Assessment - Initial Assessment Questions 1. LOCATION: "Where does it hurt?"     Neck up to temple 2. ONSET: "When did the headache start?" (Minutes, hours or days)      Sat .  Night to Sunday.   3. PATTERN: "Does the pain come and go, or has it been constant since it started?"    Comes and goes 4. SEVERITY: "How bad is the pain?" and "What does it keep you from doing?"  (e.g., Scale 1-10; mild, moderate, or severe)   - MILD (1-3): doesn't interfere with normal activities    - MODERATE (4-7): interferes with normal activities or awakens from sleep    - SEVERE (8-10): excruciating pain, unable to do any normal activities        Severe 5. RECURRENT SYMPTOM: "Have you ever had headaches before?" If so, ask: "When was the last time?" and "What happened that time?"    Only with ejaculation 6. CAUSE: "What do you think is causing the headache?"     *No Answer* 7. MIGRAINE: "Have you been diagnosed with migraine headaches?" If so, ask: "Is this headache similar?"      denies 8. HEAD INJURY: "Has there been any recent injury to the head?"     denies 9. OTHER SYMPTOMS: "Do you have any other symptoms?" (fever, stiff neck, eye pain, sore throat, cold symptoms)     denies 10. PREGNANCY: "Is there any chance you are pregnant?" "When was your last menstrual period?"       na  Protocols used: HEADACHE-A-AH

## 2018-11-19 NOTE — Telephone Encounter (Signed)
Needs appointment ASAP or go to urgent care if he's out of town. Thanks.

## 2018-11-20 ENCOUNTER — Telehealth: Payer: Self-pay | Admitting: Family Medicine

## 2018-11-20 NOTE — Telephone Encounter (Signed)
Pt called to see what Robert Tanner advised about the issue in the Nurse triage TE/ Pt also asked is she thinks his issue can be related to the hernia she diagnosed him with a couple months ago/ Pt would like a call from [facility] or the nurse asap

## 2018-11-21 NOTE — Telephone Encounter (Signed)
I advised him previously that he needs to be seen ASAP. Still needs to be seen ASAP- severe headaches with orgasms are not usually caused by hernia

## 2018-11-21 NOTE — Telephone Encounter (Signed)
Patient notified, he states that he will go to Urgent Care this afternoon.

## 2019-01-22 ENCOUNTER — Other Ambulatory Visit: Payer: Self-pay | Admitting: Family Medicine

## 2019-01-22 MED ORDER — INDOMETHACIN 50 MG PO CAPS: ORAL_CAPSULE | ORAL | 0 refills | Status: AC

## 2019-01-22 MED ORDER — TADALAFIL 20 MG PO TABS: 10.0000 mg | ORAL_TABLET | ORAL | 0 refills | Status: AC | PRN

## 2019-01-22 MED ORDER — ESOMEPRAZOLE MAGNESIUM 40 MG PO CPDR: DELAYED_RELEASE_CAPSULE | ORAL | 0 refills | Status: AC

## 2019-01-22 NOTE — Telephone Encounter (Signed)
Copied from [place] [id number]. Topic: Quick Communication - Rx Refill/Question >> Jan 22, 2019 11:18 AM Rainey Pines A wrote: Medication:indomethacin (INDOCIN) 50 MG capsule,esomeprazole (NEXIUM) 40 MG capsule,tadalafil (CIALIS) 20 MG tablet (Patient is out of town and forgot medication. Patient would like medications sent to pharmacy below and a callback from nurse once medications have been sent over.)  Has the patient contacted their pharmacy? Yes (Agent: If no, request that the patient contact the pharmacy for the refill.) (Agent: If yes, when and what did the pharmacy advise?)Contact PCP  Preferred Pharmacy (with phone number or street name): [facility] DR[facility] STORE [place], [place]  Phone:  [phone] Fax:  [phone]     Agent: Please be advised that RX refills may take up to 3 business days. We ask that you follow-up with your pharmacy.
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
## 2019-05-21 ENCOUNTER — Ambulatory Visit: Admit: 2019-05-21 | Payer: PRIVATE HEALTH INSURANCE | Primary: Internal Medicine

## 2019-05-21 ENCOUNTER — Inpatient Hospital Stay
Admit: 2019-05-21 | Discharge: 2019-05-23 | Payer: PRIVATE HEALTH INSURANCE | Source: Home / Self Care | Admitting: Nephrology

## 2019-05-21 ENCOUNTER — Encounter: Admit: 2019-05-21 | Payer: PRIVATE HEALTH INSURANCE | Primary: Internal Medicine

## 2019-05-21 LAB — PARTIAL THROMBOPLASTIN TIME     (BH GH LMW Q YH): BKR PARTIAL THROMBOPLASTIN TIME: 30.3 s (ref 23.0–32.1)

## 2019-05-21 LAB — CBC WITH AUTO DIFFERENTIAL
BKR WAM ABSOLUTE IMMATURE GRANULOCYTES: 0 x 1000/ÂµL (ref 0.0–0.4)
BKR WAM ABSOLUTE LYMPHOCYTE COUNT: 1.6 x 1000/ÂµL (ref 0.5–5.4)
BKR WAM ABSOLUTE NRBC: 0 x 1000/ÂµL
BKR WAM ANALYZER ANC: 4.2 x 1000/ÂµL (ref 2.2–7.2)
BKR WAM BASOPHIL ABSOLUTE COUNT: 0 x 1000/ÂµL (ref 0.0–0.2)
BKR WAM BASOPHILS: 0.4 % (ref 0.0–2.0)
BKR WAM EOSINOPHIL ABSOLUTE COUNT: 0.2 x 1000/ÂµL (ref 0.0–0.4)
BKR WAM EOSINOPHILS: 3.5 % (ref 0.0–4.0)
BKR WAM HEMATOCRIT: 41.9 % (ref 39.0–55.0)
BKR WAM HEMOGLOBIN: 14.9 g/dL (ref 13.9–16.3)
BKR WAM IMMATURE GRANULOCYTES: 0.1 % (ref 0.0–0.4)
BKR WAM LYMPHOCYTES: 23 % (ref 10.0–50.0)
BKR WAM MCH (PG): 34.7 pg (ref 25.0–35.0)
BKR WAM MCHC: 35.6 g/dL (ref 33.0–37.0)
BKR WAM MCV: 97.7 fL — ABNORMAL HIGH (ref 80.0–94.0)
BKR WAM MONOCYTE ABSOLUTE COUNT: 0.7 x 1000/ÂµL (ref 0.1–1.2)
BKR WAM MONOCYTES: 10.8 % (ref 3.0–11.0)
BKR WAM MPV: 11 fL (ref 8.0–12.0)
BKR WAM NEUTROPHILS: 62.2 % (ref 45.0–90.0)
BKR WAM NUCLEATED RED BLOOD CELLS: 0 % (ref 0.0–0.0)
BKR WAM PLATELETS: 216 x1000/ÂµL (ref 120–450)
BKR WAM RDW-CV: 13.1 % (ref 11.5–14.5)
BKR WAM RED BLOOD CELL COUNT: 4.3 M/ÂµL (ref 4.3–5.9)
BKR WAM WHITE BLOOD CELL COUNT: 6.8 x1000/ÂµL (ref 4.8–10.8)

## 2019-05-21 LAB — COMPREHENSIVE METABOLIC PANEL
BKR A/G RATIO: 1.1 (ref 1.0–2.2)
BKR ALANINE AMINOTRANSFERASE (ALT): 15 U/L (ref 9–59)
BKR ALBUMIN: 3.9 g/dL (ref 3.6–4.9)
BKR ALKALINE PHOSPHATASE: 114 U/L (ref 9–122)
BKR ANION GAP: 11 (ref 7–17)
BKR ASPARTATE AMINOTRANSFERASE (AST): 20 U/L (ref 10–35)
BKR BILIRUBIN TOTAL: 0.3 mg/dL (ref <=1.2)
BKR BLOOD UREA NITROGEN: 19 mg/dL (ref 8–23)
BKR BUN / CREAT RATIO: 15.1 (ref 8.0–23.0)
BKR CALCIUM: 10.1 mg/dL (ref 8.8–10.2)
BKR CHLORIDE: 101 mmol/L (ref 98–107)
BKR CO2: 28 mmol/L (ref 20–30)
BKR CREATININE: 1.26 mg/dL (ref 0.40–1.30)
BKR EGFR (AFR AMER): >60 mL/min/{1.73_m2} (ref >60)
BKR EGFR (NON AFRICAN AMERICAN): 58 mL/min/{1.73_m2} (ref >60)
BKR GLOBULIN: 3.7 g/dL
BKR GLUCOSE: 109 mg/dL — ABNORMAL HIGH (ref 70–100)
BKR POTASSIUM: 4.1 mmol/L (ref 3.3–5.1)
BKR PROTEIN TOTAL: 7.6 g/dL (ref 6.6–8.7)
BKR SODIUM: 140 mmol/L (ref 136–144)

## 2019-05-21 LAB — PROTIME AND INR
BKR INR: 1 (ref 0.87–1.13)
BKR PROTHROMBIN TIME: 11.2 s (ref 9.5–12.1)

## 2019-05-21 LAB — ZZZTROPONIN T     (Q): BKR TROPONIN T: <0.01 ng/mL (ref <0.01)

## 2019-05-21 LAB — MAGNESIUM: BKR MAGNESIUM: 1.7 mg/dL (ref 1.7–2.4)

## 2019-05-21 LAB — URIC ACID: BKR URIC ACID: 7.8 mg/dL — ABNORMAL HIGH (ref 2.7–7.3)

## 2019-05-21 MED ORDER — GLUCAGON 1 MG/ML IN STERILE WATER
Freq: Once | INTRAMUSCULAR | Status: DC | PRN
Start: 2019-05-21 — End: 2019-05-23

## 2019-05-21 MED ORDER — DEXTROSE 50 % IN WATER (D50W) INTRAVENOUS SOLUTION
INTRAVENOUS | Status: DC | PRN
Start: 2019-05-21 — End: 2019-05-23

## 2019-05-21 MED ORDER — COLCHICINE 0.6 MG TABLET
0.6 mg | Freq: Once | ORAL | Status: CP
Start: 2019-05-21 — End: ?
  Administered 2019-05-22: 03:00:00 0.6 mg via ORAL

## 2019-05-21 MED ORDER — IBUPROFEN 600 MG TABLET
600 mg | Freq: Four times a day (QID) | ORAL | Status: DC | PRN
Start: 2019-05-21 — End: 2019-05-23
  Administered 2019-05-23: 09:00:00 600 mg via ORAL

## 2019-05-21 MED ORDER — ENTERAL HYPOGLYCEMIA MANAGEMENT PRODUCT
1 | ORAL | Status: DC | PRN
Start: 2019-05-21 — End: 2019-05-23

## 2019-05-21 MED ORDER — SODIUM CHLORIDE 0.9 % (FLUSH) INJECTION SYRINGE
0.9 % | Freq: Three times a day (TID) | INTRAVENOUS | Status: DC
Start: 2019-05-21 — End: 2019-05-23
  Administered 2019-05-22 – 2019-05-23 (×2): 0.9 mL via INTRAVENOUS

## 2019-05-21 MED ORDER — COLCHICINE 0.6 MG TABLET
0.6 mg | Freq: Two times a day (BID) | ORAL | Status: DC
Start: 2019-05-21 — End: 2019-05-22

## 2019-05-21 MED ORDER — MORPHINE 4 MG/ML INTRAVENOUS SOLUTION
4 mg/mL | Freq: Once | SUBCUTANEOUS | Status: CP
Start: 2019-05-21 — End: ?
  Administered 2019-05-21: 4 mL via SUBCUTANEOUS

## 2019-05-21 MED ORDER — COLCHICINE 0.6 MG TABLET
0.6 mg | Freq: Once | ORAL | Status: CP
Start: 2019-05-21 — End: ?
  Administered 2019-05-22: 01:00:00 0.6 mg via ORAL

## 2019-05-21 MED ORDER — ENTERAL HYPOGLYCEMIA MANAGEMENT PRODUCT < 50 MG/DL
1 | ORAL | Status: DC | PRN
Start: 2019-05-21 — End: 2019-05-23

## 2019-05-21 MED ORDER — SODIUM CHLORIDE 0.9 % (FLUSH) INJECTION SYRINGE
0.9 % | INTRAVENOUS | Status: DC | PRN
Start: 2019-05-21 — End: 2019-05-23

## 2019-05-21 MED ORDER — MAGNESIUM OXIDE 400 MG (241.3 MG MAGNESIUM) TABLET
400 mg (241.3 mg magnesium) | Freq: Once | ORAL | Status: CP
Start: 2019-05-21 — End: ?
  Administered 2019-05-21: 23:00:00 400 mg (241.3 mg magnesium) via ORAL

## 2019-05-21 MED ORDER — OXYCODONE IMMEDIATE RELEASE 5 MG TABLET
5 mg | Freq: Once | ORAL | Status: CP
Start: 2019-05-21 — End: ?
  Administered 2019-05-21: 22:00:00 5 mg via ORAL

## 2019-05-21 MED ORDER — ACETAMINOPHEN 325 MG TABLET
325 mg | Freq: Four times a day (QID) | ORAL | Status: DC | PRN
Start: 2019-05-21 — End: 2019-05-23
  Administered 2019-05-22 – 2019-05-23 (×2): 325 mg via ORAL

## 2019-05-21 MED ORDER — ENOXAPARIN 40 MG/0.4 ML SUBCUTANEOUS SYRINGE
400.4 mg/0.4 mL | SUBCUTANEOUS | Status: DC
Start: 2019-05-21 — End: 2019-05-23
  Administered 2019-05-22: 04:00:00 40 mg/0.4 mL via SUBCUTANEOUS

## 2019-05-21 NOTE — ED Notes
5:18 PM 64 y.o male presents to ED c.o right knee swelling/pain since last Saturday. Reports he thought it was his Gout, took medication for Gout without relief. Pt unable to bend or walk on knee, currently using crutches his friend gave him to ambulate. States pain 9/10. Took Flexeril and Indomethacin at home today. Denies recent trauma to knee. Right knee appears swollen upon assessment. Denies swelling, numbness/tingling to right foot.  Denies any other complaints at this time. Breathing even and unlabored. Pending provider eval. Hx of Gout and Leaky heart valve. 6:28 PMPt noted to be in 2nd degree block on EKG. Moved to room 23 for cardiac monitoring. Pt in no acute distress. Report to Luisa Hart, Charity fundraiser. Dorathy Kinsman, RN

## 2019-05-21 NOTE — ED Triage Note
Patient presenting with knee pain visiting from [place] to clean and disinfect stool has been taking with no relief.  Patient diaphoretic at triage noted to be intermittently bradycardic with heart rates in the 40s quickly recovers to heart rate in the 70s.

## 2019-05-22 ENCOUNTER — Inpatient Hospital Stay: Admit: 2019-05-22 | Payer: PRIVATE HEALTH INSURANCE | Attending: Anesthesiology

## 2019-05-22 ENCOUNTER — Inpatient Hospital Stay: Admit: 2019-05-22 | Payer: PRIVATE HEALTH INSURANCE

## 2019-05-22 DIAGNOSIS — M109 Gout, unspecified: Secondary | ICD-10-CM

## 2019-05-22 LAB — POTASSIUM, URINE, RANDOM     (BH GH L LMW YH): BKR POTASSIUM, URINE, RANDOM: 54 mmol/L

## 2019-05-22 LAB — CREATININE, URINE, RANDOM: BKR CREATININE, URINE, RANDOM: 319 mg/dL

## 2019-05-22 LAB — CBC WITH AUTO DIFFERENTIAL
BKR WAM ABSOLUTE IMMATURE GRANULOCYTES: 0 x 1000/ÂµL (ref 0.0–0.4)
BKR WAM ABSOLUTE LYMPHOCYTE COUNT: 1.2 x 1000/ÂµL (ref 0.5–5.4)
BKR WAM ABSOLUTE NRBC: 0 x 1000/ÂµL
BKR WAM ANALYZER ANC: 3.4 x 1000/ÂµL (ref 2.2–7.2)
BKR WAM BASOPHIL ABSOLUTE COUNT: 0 x 1000/ÂµL (ref 0.0–0.2)
BKR WAM BASOPHILS: 0.5 % (ref 0.0–2.0)
BKR WAM EOSINOPHIL ABSOLUTE COUNT: 0.3 x 1000/ÂµL (ref 0.0–0.4)
BKR WAM EOSINOPHILS: 4.4 % — ABNORMAL HIGH (ref 0.0–4.0)
BKR WAM HEMATOCRIT: 40.1 % (ref 39.0–55.0)
BKR WAM HEMOGLOBIN: 13.9 g/dL (ref 13.9–16.3)
BKR WAM IMMATURE GRANULOCYTES: 0.5 % — ABNORMAL HIGH (ref 0.0–0.4)
BKR WAM LYMPHOCYTES: 21.4 % (ref 10.0–50.0)
BKR WAM MCH (PG): 33.7 pg (ref 25.0–35.0)
BKR WAM MCHC: 34.7 g/dL (ref 33.0–37.0)
BKR WAM MCV: 97.3 fL — ABNORMAL HIGH (ref 80.0–94.0)
BKR WAM MONOCYTE ABSOLUTE COUNT: 0.7 x 1000/ÂµL (ref 0.1–1.2)
BKR WAM MONOCYTES: 12.7 % — ABNORMAL HIGH (ref 3.0–11.0)
BKR WAM MPV: 11 fL (ref 8.0–12.0)
BKR WAM NEUTROPHILS: 60.5 % (ref 45.0–90.0)
BKR WAM NUCLEATED RED BLOOD CELLS: 0 % (ref 0.0–0.0)
BKR WAM PLATELETS: 200 x1000/ÂµL (ref 120–450)
BKR WAM RDW-CV: 13.2 % (ref 11.5–14.5)
BKR WAM RED BLOOD CELL COUNT: 4.1 M/ÂµL — ABNORMAL LOW (ref 4.3–5.9)
BKR WAM WHITE BLOOD CELL COUNT: 5.7 x1000/ÂµL (ref 4.8–10.8)

## 2019-05-22 LAB — BODY FLUID CELL COUNT, WAM     (BH YH)
BKR WAM RBC FLUID: 1000 {cells}/uL — ABNORMAL HIGH
BKR WAM TOTAL COUNT BODY FLUIDS: 21285 {cells}/uL
BKR WAM WBC FLUID: 21175 {cells}/uL

## 2019-05-22 LAB — BASIC METABOLIC PANEL
BKR ANION GAP: 9 (ref 7–17)
BKR BLOOD UREA NITROGEN: 19 mg/dL (ref 8–23)
BKR BUN / CREAT RATIO: 16.8 (ref 8.0–23.0)
BKR CALCIUM: 9.5 mg/dL (ref 8.8–10.2)
BKR CHLORIDE: 102 mmol/L (ref 98–107)
BKR CO2: 27 mmol/L (ref 20–30)
BKR CREATININE: 1.13 mg/dL (ref 0.40–1.30)
BKR EGFR (AFR AMER): >60 mL/min/{1.73_m2} (ref >60)
BKR EGFR (NON AFRICAN AMERICAN): >60 mL/min/{1.73_m2} (ref >60)
BKR GLUCOSE: 101 mg/dL — ABNORMAL HIGH (ref 70–100)
BKR POTASSIUM: 3.7 mmol/L (ref 3.3–5.1)
BKR SODIUM: 138 mmol/L (ref 136–144)

## 2019-05-22 LAB — LIPID PANEL
BKR CHOLESTEROL/HDL RATIO: 3.1 (ref 0.0–5.0)
BKR CHOLESTEROL: 142 mg/dL
BKR HDL CHOLESTEROL: 46 mg/dL (ref >=40)
BKR LDL CHOLESTEROL CALCULATED: 71 mg/dL
BKR TRIGLYCERIDES: 124 mg/dL

## 2019-05-22 LAB — URINALYSIS-MACROSCOPIC W/REFLEX MICROSCOPIC
BKR BILIRUBIN, UA: NEGATIVE
BKR BLOOD, UA: NEGATIVE
BKR GLUCOSE, UA: NEGATIVE
BKR KETONES, UA: NEGATIVE
BKR LEUKOCYTE ESTERASE, UA: NEGATIVE
BKR NITRITE, UA: NEGATIVE
BKR PH, UA: 6 (ref 5.5–7.5)
BKR SPECIFIC GRAVITY, UA: 1.029 (ref 1.005–1.030)
BKR UROBILINOGEN, UA: <2 EU/dL (ref <=2.0)

## 2019-05-22 LAB — BODY FLUID CELL COUNT     (BH GH LMW YH)

## 2019-05-22 LAB — LYME ANTIBODIES W/RFLX TO CONFIRM (MODIFIED TWO-TIER TESTING): BKR LYME ANTIBODY: 0.09 (ref 0.0–0.89)

## 2019-05-22 LAB — TSH W/REFLEX TO FT4     (BH GH LMW Q YH): BKR THYROID STIMULATING HORMONE: 2.56 u[IU]/mL

## 2019-05-22 LAB — CHLORIDE, URINE, RANDOM     (BH GH LMW YH): BKR CHLORIDE, URINE, RANDOM: 133 mmol/L

## 2019-05-22 LAB — HEMOGLOBIN A1C
BKR ESTIMATED AVERAGE GLUCOSE: 114 mg/dL
BKR HEMOGLOBIN A1C: 5.6 % (ref 4.0–5.6)

## 2019-05-22 LAB — GLUCOSE, BODY FLUID     (BH GH L LMW YH): BKR GLUCOSE FLUID: 86 mg/dL

## 2019-05-22 LAB — SODIUM, URINE, RANDOM W/O CREATININE: BKR SODIUM, URINE RANDOM: 146 mmol/L

## 2019-05-22 LAB — BODY FLUID DIFF     (BH)
BKR MONOCYTE, FLUID: 5 %
BKR SEGS MAN FLUID: 95 %

## 2019-05-22 LAB — SYNOVIAL FLUID, CRYSTAL

## 2019-05-22 LAB — UREA NITROGEN, URINE, RANDOM     (BH GH LMW Q YH): BKR UREA NITROGEN, URINE, RANDOM: 1246 mg/dL

## 2019-05-22 LAB — SEDIMENTATION RATE (ESR): BKR SEDIMENTATION RATE, ERYTHROCYTE: 49 mm/h — ABNORMAL HIGH (ref 0–20)

## 2019-05-22 LAB — SARS COV-2 (COVID-19) RNA: BKR SARS-COV-2 RNA (COVID-19) (YH): NEGATIVE

## 2019-05-22 LAB — C-REACTIVE PROTEIN     (CRP): BKR C-REACTIVE PROTEIN, HIGH SENSITIVITY: 101.3 mg/L — ABNORMAL HIGH

## 2019-05-22 LAB — ZZZTROPONIN T     (Q): BKR TROPONIN T: <0.01 ng/mL (ref <0.01)

## 2019-05-22 LAB — PROTEIN, BODY FLUID     (BH GH L LMW YH): BKR PROTEIN FLUID: 4.4 g/dL

## 2019-05-22 MED ORDER — MIDAZOLAM (PF) 1 MG/ML INJECTION SOLUTION
1 mg/mL | Status: CP
Start: 2019-05-22 — End: ?

## 2019-05-22 MED ORDER — ATROPINE 0.1 MG/ML INJECTION SYRINGE
0.1 mg/mL | INTRAVENOUS | Status: DC | PRN
Start: 2019-05-22 — End: 2019-05-22

## 2019-05-22 MED ORDER — CEFAZOLIN IV PUSH 1 GRAM VIAL & STERILE WATER (ADULT)
Freq: Three times a day (TID) | INTRAVENOUS | Status: DC
Start: 2019-05-22 — End: 2019-05-23
  Administered 2019-05-23 (×2): 10.000 mL via INTRAVENOUS

## 2019-05-22 MED ORDER — FENTANYL (PF) 50 MCG/ML INJECTION SOLUTION
50 mcg/mL | Status: CP
Start: 2019-05-22 — End: ?

## 2019-05-22 MED ORDER — ATROPINE 0.1 MG/ML INJECTION SYRINGE
0.1 mg/mL | Freq: Once | INTRAVENOUS | Status: DC | PRN
Start: 2019-05-22 — End: 2019-05-23

## 2019-05-22 MED ORDER — BACITRACIN 50,000 UNIT INTRAMUSCULAR SOLUTION
50000 unit | Status: CP
Start: 2019-05-22 — End: ?

## 2019-05-22 MED ORDER — FENTANYL (PF) 50 MCG/ML INJECTION SOLUTION
50 mcg/mL | Status: DC | PRN
Start: 2019-05-22 — End: 2019-05-22
  Administered 2019-05-22 (×2): 50 mcg/mL via INTRAVENOUS

## 2019-05-22 MED ORDER — IBUPROFEN 400 MG TABLET
400 mg | Freq: Three times a day (TID) | ORAL | Status: DC
Start: 2019-05-22 — End: 2019-05-23
  Administered 2019-05-22 – 2019-05-23 (×4): 400 mg via ORAL

## 2019-05-22 MED ORDER — PROPOFOL 10 MG/ML INTRAVENOUS EMULSION
10 mg/mL | Status: DC | PRN
Start: 2019-05-22 — End: 2019-05-22
  Administered 2019-05-22 (×11): 10 mg/mL via INTRAVENOUS

## 2019-05-22 MED ORDER — LIDOCAINE HCL 20 MG/ML (2 %) INJECTION SOLUTION
20 mg/mL (2 %) | Status: CP
Start: 2019-05-22 — End: ?

## 2019-05-22 MED ORDER — MIDAZOLAM (PF) 1 MG/ML INJECTION SOLUTION
1 mg/mL | Status: DC | PRN
Start: 2019-05-22 — End: 2019-05-22
  Administered 2019-05-22: 20:00:00 1 mg/mL via INTRAVENOUS

## 2019-05-22 MED ORDER — LACTATED RINGERS INTRAVENOUS SOLUTION
Status: DC | PRN
Start: 2019-05-22 — End: 2019-05-22
  Administered 2019-05-22: 19:00:00 via INTRAVENOUS

## 2019-05-22 MED ORDER — BUPIVACAINE (PF) 0.5 % (5 MG/ML) INJECTION SOLUTION
0.5 % (5 mg/mL) | Status: CP
Start: 2019-05-22 — End: ?

## 2019-05-22 MED ORDER — CEFAZOLIN 1 GRAM SOLUTION FOR INJECTION
1 gram | Status: DC | PRN
Start: 2019-05-22 — End: 2019-05-22
  Administered 2019-05-22: 20:00:00 1 gram via INTRAVENOUS

## 2019-05-22 MED ORDER — VANCOMYCIN 1.5 G IN 500 ML IVPB (VIALMATE)
Freq: Once | INTRAVENOUS | Status: CP
Start: 2019-05-22 — End: ?
  Administered 2019-05-22: 20:00:00 500.000 mL via INTRAVENOUS

## 2019-05-22 MED ORDER — VANCOMYCIN 1.5 GRAM/150 ML IN 0.9 % SODIUM CHLORIDE INTRAVENOUS
1.5 gram/150 mL | Freq: Once | INTRAVENOUS | Status: DC
Start: 2019-05-22 — End: 2019-05-22

## 2019-05-22 MED ORDER — IBUPROFEN 600 MG TABLET
600 mg | Freq: Four times a day (QID) | ORAL | Status: DC
Start: 2019-05-22 — End: 2019-05-22

## 2019-05-22 NOTE — Plan of Care
Problem: Adult Inpatient Plan of CareGoal: Readiness for Transition of CareOutcome: Initial problem identification Plan of Care Overview/ Patient Status    CM Assessment and Discharge Planning    Most Recent Value Case Management Assessment Do you have a caregiver?  No Patient Requires Care Coordination Intervention Due To  uninsured Arrived from prior to admission  home Admitted from:  Home Bed Hold   n/a Services Prior to Admission  none ADL Assistance  None Type of Home Care Services  None What equipment do you currently use at home?  crutches, axillary Documented Insurance Accurate  No Any financial concerns related to anticipated discharge needs  Yes  Financial Concerns  no insurance coverage, unable to afford medications Referred to:  Other [SW] Patient's home address verified  Yes Patient's PCP of record verified  No Living Environment  Lives With  Significant other Current Living Arrangements  home/apartment/condo Home Accessibility  stairs to enter home, bed and bath on same level Number of Stairs, Main Entrance  three Transportation Available  family or friend will provide Home Safety Feels Safe Living In Home  yes Source of Clinical History Patient's clinical history has been reviewed and source of Information is:  Patient, Medical record CM/SW Attestation: Choose which ONE is appropriate for you I have reviewed the medical record and completed the above evaluation with the following recommendations.  Yes Discharge Planning Coordination Recommendations Discharge Planning Coordination Recommendations  Home with MD follow-up/No needs identified RN reviewed plan of care/ continuum of care need's with   Patient Discharge Planning Assessment of the spouse/caregiver's ability and willingness to provide care for the patient  Lives with [place]. Concerns to be Addressed  financial/insurance, no PCP Barriers to Discharge inadequate/no insurance, inadequate/no prescription coverage Referral(s) placed for:  -- [SW]   Admit for knee pain and SB.Chart ReviewedIn room or over the phone Care Coordinator spoke with patient/family introduced self & role.  The patient lives with S/O.He has 3 STE, one level home.He has been using crutches.No VNS, PCP, or insurance.He drives self.SW consulted.See flowsheet documentation for assessment and interventions.Patients anticipated discharge plan: Home with MD follow up.Candace Cruise BSN RNCase ManagerCare CoordinationNE 10 CardiologyMHB [phone]/call

## 2019-05-22 NOTE — ED Notes
6:43 PM Pt care assumed. Report received from [facility]. Xray at bedside at this time. Pt in NAD at this time. WCTM7:53 PM Pt in NAD at this time. Blood samples collected and sent to lab, COVID swab obtained and sent. Plan for bedside procedure at this time. Pt in NAD. Provider aware of pt HR dropping to 38-46s. WCTM8:00 PM Pt medication given as per MAR, tolerated well. Pt in NAD at this time. WCTM9:16 PM Hospitalist at the bedside for evaluation. 11:19 PM Bedside care handoff report given to [facility] RN

## 2019-05-22 NOTE — Consults
[facility]	Cardiology Consult NotePatient Data:  Patient Name: Brett [name] Age: 64 y.o. DOB: [birth year]	 MRN: [id number]	  Consult Information: Consultation requested by: Tretha Sciara, MDReason for consultation: Mobitz II Second-Degree Heart BlockSource of Information: Patient and EMR/Previous RecordPresentation History: Brett [name] is a 64 year old male with a PMHx of gout who was admitted to [facility] on 4/21 with a few days history of atraumatic R knee pain and swelling. In ED found to be bradycardic with HR 44 and diaphoretic, however he was asymptomatic at the time, denying any chest pain, SOB, dizziness, subjective diaphoresis, or palpitations. Vitals were otherwise stable with BP 170/84 and SpO2 96% on RA. Initial EKG demonstrated 2:1 AVN heart block with mild QRS prolongations and RBBB. Patient was placed on continuous tele monitoring with pacer pads and atropine at bedside. Repeat EKG in evening demonstrate NSR with HR in 60s, with persistent RBBB. Of note, patient denies any history of cardiac arrhythmias. He has recently moved to [place] from [place]. In 2019 has was experiencing occasional pressure-like chest pain which was worked up by cardiology - per patient he had a stress test done in [place] which was normal, as well as an ECHO during which he was told he had leaky valves, but does not remember any details. Denied any recent tick bites or outdoor activities. Has remained hemodynamically stable and asymptomatic since admission. He underwent arthrocentesis which demonstrated likely inflammatory arthritis with low suspicion for septic arthritis. Review of Allergies/Medical History/Medications: Inpatient Medications:Current Facility-Administered Medications Medication Dose Route Frequency Provider Last Rate Last Admin ? enoxaparin (LOVENOX) syringe 40 mg  40 mg Subcutaneous Q24H Brett Drape, MD   40 mg at 05/22/19 0022 ? ibuprofen (ADVIL,MOTRIN) tablet 800 mg  800 mg Oral TID Brett Drape, MD   800 mg at 05/22/19 0248 ? sodium chloride 0.9 % flush 3 mL  3 mL IV Push Q8H Brett Drape, MD   3 mL at 05/22/19 0535  Review of Systems: Review of Systems Constitutional: Negative for chills, diaphoresis, fatigue and fever. Respiratory: Negative for cough, chest tightness and shortness of breath.  Cardiovascular: Negative for chest pain, palpitations and leg swelling. Gastrointestinal: Negative for abdominal pain, diarrhea, nausea and vomiting. Musculoskeletal: Positive for arthralgias. Neurological: Negative for dizziness, syncope, weakness, light-headedness and headaches. Psychiatric/Behavioral: Negative.  Physical Exam: Vitals:Last 24 hours: Temp:  [97.8 ?F (36.6 ?C)-98.9 ?F (37.2 ?C)] 98 ?F (36.7 ?C)Pulse:  [40-75] 64Resp:  [16-20] 18BP: (130-171)/(70-91) 154/87SpO2:  [96 %-99 %] 98 %Intake/Output:No intake or output data in the 24 hours ending 05/22/19 1330Physical Exam Constitutional: He is oriented to person, place, and time. He appears well-developed and well-nourished. No distress. Neck: No JVD present. Cardiovascular: Normal rate, regular rhythm, normal heart sounds and intact distal pulses. Exam reveals no gallop. No murmur heard.On tele HR in 70s, occasionally sudden dips into 40s with evidence of heart block on tele monitor Pulmonary/Chest: Effort normal and breath sounds normal. No respiratory distress. He has no wheezes. He has no rales. He exhibits no tenderness. Abdominal: Soft. He exhibits no distension. There is no abdominal tenderness. There is no rebound and no guarding. Musculoskeletal:    Comments: Right knee wrapped in dressing, able to minimally flex right extremity but still having moderate-significant pain in the area; knee not tender to palpation Neurological: He is alert and oriented to person, place, and time. Skin: He is diaphoretic. Psychiatric: He has a normal mood and affect. Review of Labs/Diagnostics: Lab Review:Results for Brett [name] (MRN [id number]) as of  05/22/2019 13:30 Ref. Range 05/21/2019 16:55 05/21/2019 17:26 05/21/2019 19:50 05/22/2019 05:34 Sodium Latest Ref Range: 136 - 144 mmol/L 140   138 Potassium Latest Ref Range: 3.3 - 5.1 mmol/L 4.1   3.7 Chloride Latest Ref Range: 98 - 107 mmol/L 101   102 CO2 Latest Ref Range: 20 - 30 mmol/L 28   27 Anion Gap Latest Ref Range: 7 - 17  11   9  BUN Latest Ref Range: 8 - 23 mg/dL 19   19 Creatinine Latest Ref Range: 0.40 - 1.30 mg/dL 5.40   9.81 BUN/Creatinine Ratio Latest Ref Range: 8.0 - 23.0  15.1   16.8 eGFR (NON African-American) Latest Ref Range: >60 mL/min/1.58m2 58   >60 eGFR (Afr Amer) Latest Ref Range: >60 mL/min/1.18m2 >60   >60 Glucose Latest Ref Range: 70 - 100 mg/dL 191 (H)   478 (H) Calcium Latest Ref Range: 8.8 - 10.2 mg/dL 29.5   9.5 Magnesium Latest Ref Range: 1.7 - 2.4 mg/dL 1.7    Uric Acid Latest Ref Range: 2.7 - 7.3 mg/dL 7.8 (H)    Total Bilirubin Latest Ref Range: <=1.2 mg/dL 0.3    Alkaline Phosphatase Latest Ref Range: 9 - 122 U/L 114    Alanine Aminotransferase (ALT) Latest Ref Range: 9 - 59 U/L 15    Aspartate Aminotransferase (AST) Latest Ref Range: 10 - 35 U/L 20    Total Protein Latest Ref Range: 6.6 - 8.7 g/dL 7.6    Albumin Latest Ref Range: 3.6 - 4.9 g/dL 3.9    Globulin Latest Units: g/dL 3.7    A/G Ratio Latest Ref Range: 1.0 - 2.2  1.1    Cholesterol Latest Ref Range: See Comment mg/dL    621 HDL Cholesterol Latest Ref Range: >=40 mg/dL    46 Triglycerides Latest Ref Range: See Comment mg/dL    308 LDL Calculated Latest Ref Range: See Comment mg/dL    71 Chol/HDL Ratio Latest Ref Range: 0.0 - 5.0     3.1 Troponin T Latest Ref Range: <0.01 ng/mL <0.01  <0.01  CRP, High Sensitivity Latest Ref Range: See comment  mg/L    101.3 (H) Thyroid Stimulating Hormone, 3rd Gen. Latest Ref Range: See Comment ?IU/mL    2.560 Estimated Average Glucose Latest Units: mg/dL    657 Hemoglobin Q4O Latest Ref Range: 4.0 - 5.6 %    5.6 Creatinine, Urine, Random Latest Ref Range: Reference Range Not Established mg/dL    962 WBC Latest Ref Range: 4.8 - 10.8 x1000/?L 6.8   5.7 RBC Latest Ref Range: 4.3 - 5.9 M/?L 4.3   4.1 (L) Hemoglobin Latest Ref Range: 13.9 - 16.3 g/dL 95.2   84.1 Hematocrit Latest Ref Range: 39.0 - 55.0 % 41.9   40.1 MCV Latest Ref Range: 80.0 - 94.0 fL 97.7 (H)   97.3 (H) MCH Latest Ref Range: 25.0 - 35.0 pg 34.7   33.7 MCHC Latest Ref Range: 33.0 - 37.0 g/dL 32.4   40.1 RDW-CV Latest Ref Range: 11.5 - 14.5 % 13.1   13.2 nRBC Latest Ref Range: 0.0 - 0.0 % 0.0   0.0 Platelets Latest Ref Range: 120 - 450 x1000/?L 216   200 MPV Latest Ref Range: 8.0 - 12.0 fL 11.0   11.0 Neutrophils Latest Ref Range: 45.0 - 90.0 % 62.2   60.5 Lymphocytes Latest Ref Range: 10.0 - 50.0 % 23.0   21.4 Monocytes Latest Ref Range: 3.0 - 11.0 % 10.8   12.7 (H) Eosinophils Latest Ref Range: 0.0 - 4.0 %  3.5   4.4 (H) Basophils Latest Ref Range: 0.0 - 2.0 % 0.4   0.5 ANC (Abs Neutrophil Count) Latest Ref Range: 2.2 - 7.2 x 1000/?L 4.2   3.4 Absolute Lymphocyte Count Latest Ref Range: 0.5 - 5.4 x 1000/?L 1.6   1.2 Monocytes (Absolute) Latest Ref Range: 0.1 - 1.2 x 1000/?L 0.7   0.7 Eosinophil Absolute Count Latest Ref Range: 0.0 - 0.4 x 1000/?L 0.2   0.3 Basophils Absolute Latest Ref Range: 0.0 - 0.2 x 1000/?L 0.0   0.0 Immature Granulocytes (Abs) Latest Ref Range: 0.0 - 0.4 x 1000/?L 0.0   0.0 nRBC Absolute Latest Units: x 1000/?L 0.0   0.0 Immature Granulocytes Latest Ref Range: 0.0 - 0.4 % 0.1   0.5 (H) Sedimentation Rate (ESR) Latest Ref Range: 0 - 20 mm/hr    49 (H) Prothrombin Time Latest Ref Range: 9.5 - 12.1 seconds  11.2   INR Latest Ref Range: 0.87 - 1.13   1.00   PTT Latest Ref Range: 23.0 - 32.1 seconds  30.3   Diagnostic Review:Xr Chest Pa Or ApResult Date: 4/21/2021CLINICAL INFORMATION: cardiac arhythmia TECHNIQUE: XR CHEST PA OR AP COMPARISON: None. FINDINGS: Heart/Mediastinum:  Normal heart size.  Unremarkable mediastinum. Lungs:  Mild interstitial prominence with no focal consolidation.   No pleural effusions. Osseous Structures:  No acute abnormalities. Additional Comments: None. IMPRESSION:  Mild interstitial prominence with no focal consolidation.. Reported and signed by:  Rema Jasmine, MDXr Knee 4v RightResult Date: 4/21/2021CLINICAL INFORMATION: pain and swelling TECHNIQUE: XR KNEE RIGHT AP LATERAL AND OBLIQUES COMPARISON: None. FINDINGS: Bones:  Unremarkable.  No acute osseous abnormalities. Joints:  Unremarkable.    Large effusion. Soft tissues:  Unremarkable.  Additional Comments: There is a linear calcification overlying the lateral femorotibial compartment on the AP and external rotation images, but not the internal rotation image and it is difficult to identify on the lateral view although it could correspond to vascular calcifications posterior to the joint. The differential would include a small intra-articular body, but no donor site is evident and its absence on the internal rotation view is hard to justified. IMPRESSION:  Large knee effusion. Questionable calcified intra-articular body versus vascular calcification. Recommend MR imaging. Reported and signed by:  Rema Jasmine, MD  ECG/Tele Events: Results for orders placed or performed during the hospital encounter of 05/21/19 EKG Result Value Ref Range  Heart Rate 71 bpm  QRS Duration 132 ms  Q-T Interval 438 ms  QTC Calculation(Bezet) 475 ms  P Axis 115 deg  R Axis 94 deg  T Axis 139 deg  P-R Interval 154 msec  ECG - SEVERITY Abnormal ECG severity Impression: Clarance Bollard is a 64 year old male with a PMHx of gout who was admitted to [facility] on 4/21 with a few days history of atraumatic R knee pain and swelling. In ED found to be bradycardic with HR 44 and diaphoretic, however he was asymptomatic at the time, denying any chest pain, SOB, dizziness, subjective diaphoresis, or palpitations. Vitals were otherwise stable with BP 170/84 and SpO2 96% on RA. Initial EKG demonstrated 2:1 AVN heart block with mild QRS prolongations and RBBB. Patient was placed on continuous tele monitoring with pacer pads and atropine at bedside. Repeat EKG in evening demonstrate NSR with HR in 60s, with persistent RBBB. Of note, patient denies any history of cardiac arrhythmias. He has recently moved to [place] from [place]. In 2019 has was experiencing occasional pressure-like chest pain which was worked up by cardiology -  per patient he had a stress test done in [place] which was normal, as well as an ECHO during which he was told he had leaky valves, but does not remember any details. Denied any recent tick bites or outdoor activities. Has remained hemodynamically stable and asymptomatic since admission. He underwent arthrocentesis which demonstrated likely inflammatory arthritis with low suspicion for septic arthritis. Upon my evaluation patient was comfortable and asymptomatic. On tele monitor NSR but occasionally developing heart block with HR dropping to low 40s. As the day progressed, patient continued to have asymptomatic drops of HR to as low as 30s. 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 Domenick Gong, MD. Please refer to attending 's addendum for final recommendations. Electronically Signed: Jake Shark, MDPGY1, Cardiology 05/22/2019 9:56 AMPatient seen and examined. Pertinent data reviewed on rounds. I have reviewed and edited the above note of Dr. Henri Medal, which reflects our mutual observations, assessment and recommendations for the patient's care.Fransisca Kaufmann, MD, [place], [facility]

## 2019-05-22 NOTE — ED Notes
7:50 AMAssumed care of pt. Report received from [place], [place]. Pt a/o x4 in NAD. On cardiopulmonary monitoring, HR in the 60's at this time with drops into the 40's. Pt denies chest pain, palpitations or dizziness. Pt to be evaluated by cardiology. Pt states they want to put a pacemaker in me, but I'm not ready for this right now. I need to get things straight, I need to go home first and think about this.Hospitalist at bedside for evaluation. 9:38 AMPt states he is not in any pain, refused ibuprofen at this time. Pt remains on cardiac monitoring. When pt sleeps, HR noted to drop upper 30's to 40. 10:15 AMCardiology at bedside. 11:06 AMPt vitals remain unchanged since arrival. HR between 40-70, pt a symptomatic. Kathryne Eriksson, RNED to Floor HandoffAdmission Dx:   Acute pain of R knee + cardiac arthymia + gout           Telemetry: 	[x]  Yes		[]  NoOxygen Tank on Stretcher >1000 PSI:  [x]  YesCode Status:   [x]  Full		[]  Partial		[]  No CodeIsolation: 	[x]  None	[]  Contact	[]  Droplet	[]  AirborneSafety Precautions: []  None	[]  Sitter   []  Restraints	[]  Suicidal	[x]  Fall Risk	Other (specify):           Mentation/Orientation:    A&O (person, place, time and situation) x    4      	 		Disoriented to:          Deficits: []  Hearing impaired	[]  Blind  	[]  Nonverbal		 []  ID/DD (intelectual disability/developmental disability)Ambulation: [x]  Ambulatory	[]  Non-Ambulatory  Diet: [x]  OK to eat	[]  NPO	Other (specify):           IV Access: [x]  Yes   []  No     Vital signs Charted in the last hour: [x] Yes    []  No         Other (specify)            Skin Alteration:[]  Pressure Injury	[]  Wound	[x]  None	[]  Skin Not AssessedDiarrhea/Loose stool : []  1x within 24h  []  2x within 24h  []  3x within 24h  []  None      C.Diff Order:  []  Ordered- needs to be collected    []  Collected-sent to lab    []  Resulted - Negative C.Diff  []  Resulted - Positive C.Diff    []  Not Ordered   	[]  N/APatient Belongings:Does the patient have belongings going to the floor?   [] Yes    []  NoAre the belongings documented?          [] Yes    []  NoIs someone taking belongings home?   [] Yes    []  No   Who (specify):           _________ED RN and MHB #:   JKalici                Please call the ED RN if you have any questions            You can always call the [facility] ED charge nurse at [phone] if you have any concerns [facility] ED [facility] Emergency Department N10 called to notify of pt arrival.

## 2019-05-22 NOTE — ED Notes
8:00 PM

## 2019-05-22 NOTE — Other
Post Anesthesia Transfer of Care NotePatient: Brett SladeProcedure(s) Performed: Procedure(s) (LRB):PACEMAKER IMPLANT (N/A) Patient location: PACU Last Vitals: Vitals Value Taken Time BP   166/95  05/22/19 1709 Temp  05/22/19 1709 Pulse 77 05/22/19 1709 Resp  16  05/22/19 1709 SpO2 94 % 05/22/19 1709 Vitals shown include unvalidated device data.Level of consciousness: awake, alert  and orientedTransport Vital Signs:  Stable since the last set of recorded intra-operative vital signsComplications: noneIntra-operative Intake & Output and Antibiotics as per Anesthesia record and discussed with the RN.

## 2019-05-22 NOTE — Plan of Care
Plan of Care Overview/ Patient Status    Patient s/p pacer implant, LUC site CDI with no bleeding or hematoma, VSS on room air, SR with occ v-pacing.  Assisted to order dinner.  Bedrest maintained.  Plan for monitoring on angio and return to his bed on NE10 when appropriate.Keitha Butte, RN4/22/20215:27 PM

## 2019-05-22 NOTE — Utilization Review (ED)
Right knee pain, pan management, right knee arthrocentesis, plan for colchicine, possible 2nd degree heart block, plan for cardiology consult.UM Status: Meets Observation Status  Erlinda Hong, RN [facility] Utilization Review Office - [phone]: [email or web address]

## 2019-05-22 NOTE — Other
Operative Diagnosis:Pre-op:   Mobitz 2 heart block Patient Coded Diagnosis   None  Patient Diagnosis   Pre-op diagnosis: Mobitz 2 heart block  Post-op diagnosis:     * No Diagnosis Codes entered *Operative Procedure(s) :Procedure(s) (LRB):PACEMAKER IMPLANT (N/A)Post-op Procedure & Diagnosis ConfirmationPost-op Diagnosis: Post-op Diagnosis updated (see notes)     - Mobitz 2 heart blockPost-op Procedure: Post-op Procedure updated (see notes)     - Permanent PACEMAKER IMPLANT

## 2019-05-22 NOTE — Anesthesia Pre-Procedure Evaluation
This is a 64 y.o. male scheduled for PACEMAKER IMPLANT (N/A ).Review of Systems/ Medical HistoryPatient summary, nursing notes, EKG/Cardiac Studies , Labs, pre-procedure vitals, height, weight and NPO status reviewed.No previous anesthesia concernsAnesthesia Evaluation:   No history of anesthetic complications  There is no height or weight on file to calculate BMI. Cardiovascular: Negative   Patient has a history of: hypertension. -Exercise tolerance: >4 METS -Dysrhythmia(s): yes: atrial and nodal-Vascular Disease:  Negative   Respiratory:  Negative.-Lung Disorders: -COPD:  yes, mild COPD.-Nicotine Dependence: yes, smoking HEENT: Negative.Neuromuscular: NegativeSkeletal/Skin:  NegativeGastrointestinal/Genitourinary:  Negative -Nutritional Disorders: Patient has has increased body weight- obesity.Hematological/Lymphatic: NegativeEndocrine/Metabolic:  Negative.Behavioral/Psychiatric & Syndromes: NegativePhysical ExamCardiovascular:  Rhythm: regularHeart Sounds: S1 present and S2 present.Pulmonary:  normal exam  decreased breath soundsPatient's breath sounds clear to auscultationAirway:  Mallampati: IITM distance: >3 FBNeck ROM: fullDental:  normal exam  Dentition: missing and chippedAnesthesia PlanASA 3 The primary anesthesia plan is  MAC. Minimal sedationPerioperative Code Status confirmed: It is my understanding that the patient is currently designated as 'Full Code' and will remain so throughout the perioperative period.Anesthesia informed consent obtained. Consent obtained from: patientUse of blood products: consented  The post operative pain plan is per surgeon management.Plan discussed with CRNA.Anesthesiologist's Pre Op NoteI personally evaluated and examined the patient prior to the intra-operative phase of care.  (When Applicable) The preoperative evaluation was reviewed and the patient's status is unchanged.

## 2019-05-22 NOTE — Anesthesia Post-Procedure Evaluation
Anesthesia Post-op NotePatient: Brett SladeProcedure(s):  Procedure(s) (LRB):PACEMAKER IMPLANT (N/A) Patient location: Telemetry/ Step Down UnitLast Vitals:  I have noted the vital signs as listed in the nursing notes.Mental status recovered: patient participates in evaluation: YesVital signs reviewed: YesRespiratory function stable:YesAirway is patent: YesCardiovascular function and hydration status stable: YesPain control satisfactory: YesNausea and vomiting control satisfactory:Yes

## 2019-05-22 NOTE — Plan of Care
Plan of Care Overview/ Patient Status    Admission Note Nursing Brett [name] is a 64 y.o. male admitted with a chief complaint of R knee pain. Patient arrived from  Patient is  Orientation: oriented x 4 Vitals:  05/22/19 0612 05/22/19 1055 05/22/19 1122 05/22/19 1242 BP: 138/89 (!) 130/90 (!) 171/91 (!) 154/87 Pulse: 62 (!) 40 75 64 Resp: 16 18 18 18  Temp: 97.8 ?F (36.6 ?C) 98.6 ?F (37 ?C) 97.9 ?F (36.6 ?C) 98 ?F (36.7 ?C) TempSrc: Oral Oral Oral Oral SpO2: 96% 97% 99% 98% Oxygen therapy Oxygen TherapySpO2: 98 %Device (Oxygen Therapy): room airI have reviewed the patient's current medication orders..Comments: alert and oriented X 4. VSS, htn on RA. Tele: AV block in 30's, REV called and EKG completed. Possible transfer to PCU, or placement of permanent pacemaker. Steady SB with crutches. R knee pain and swelling, tylenol given. Skin intact. NPO for this time. IV patent. Bed alarm on for safety. Hourly rounding. Will continue to monitor.1300: patient will be getting a pacemaker at 1400. asymptotic at this time. Pads placed per PA.Johny Shock Priscille Shadduck, RNSee flowsheets, patient education and plan of care for additional information.

## 2019-05-22 NOTE — ED Notes
12:24 AM Assumed care of patient report received from [facility]. Patient alert and oriented x4 in NARD on cardio pulmonary monitor. Pt bradycardic with HR trending o high 30's now 41, patient denies any cardiac complaints and has been trending low. Patietn noted to be in second degree heart block from previous EKG. Patient denies any knee pain or complaints at this time. Subcutaneous Lovenox given per MAR. Plan for admission. 3:11 AMPO ibuprofen given per [facility] for 2/10 right knee pain. ACE bandage re wrapped per patient request, CMS intact. Plan for admission. Pending admission bed and orders. 5:43 AMPt assisted with bedside urinal, urine collected and sent to lab. Blood work collected and sent to lab. Patient resting comfortably in stretcher denies any complaints at this time. Pending admission bed7:03 AMLyme antibodies blood work collected and sent ot lab. Patient awaiting admission bed. 7:33 AMReport Noreene Larsson RN

## 2019-05-22 NOTE — Brief Op Note
Brief Op Note:Successful dual chamber pacemaker implantation.RV lead in septum. Final QRS narrow but may be the result of combined left septal and right septal capture.No clear loss of LB capture with unipolar pacing. Clear morphology change with bipolar pacing consistent with loss of right septal capture.No apparent complications.A/P:Monitor overnight.Check CXR now and in am.EKG now and in am.Re-check device in am.Ancef overnight. Keflex for three days starting tomorrow.Hold SQ heparin for 24 hours, use SCD's.FU Device clinic 1 week.Signed:Rolland Bimler, MD, [facility], FHRS4/22/2021 [facility]  [address], [place] [phone] [facility] [address], [place] [address]WGNFA:  [phone]: [phone] [place]:[address], [place] [id number]: [phone]: [phone] [place]:[address], [place] [address]EPPIR: [phone]: [phone]

## 2019-05-22 NOTE — Other
Pt RRT for asymptomatic HB. Pt alert, oriented. Denies chest pain, dizziness. Warm, pink, pulses palpable. Having periods of what looks like second degree II avb. ekg completed, no block recorded, shows sr 68. Tele strip reviewed by Dr Lovenia Kim CCU. Plan is to bring to cath lab an insert pacer. Will follow further as needed. Karlene Lineman CCRN1800Pt received pacer. In angio suite now. Cause of original RRT was heartblock, no longer will need RRT services. Discussed with chrisitine aprn. Karlene Lineman CCRN

## 2019-05-22 NOTE — ED Provider Notes
HistoryChief Complaint Patient presents with ? Knee Pain   r  knee  pain  sionce  sunday    low  heart  rate   The history is provided by the patient and medical records. Knee PainLocation:  KneeTime since incident:  4 daysInjury: no  Knee location:  R kneePain details:   Quality:  Aching  Radiates to:  Does not radiate  Severity:  Severe  Onset quality:  Gradual  Duration:  4 days  Timing:  Constant  Progression:  WorseningChronicity:  NewDislocation: no  Prior injury to area:  NoRelieved by:  NothingWorsened by:  Activity, bearing weight and flexionIneffective treatments: indomethacin.Associated symptoms: decreased ROM and swelling  Associated symptoms: no back pain, no fever, no neck pain and no numbness   No past medical history on file.No past surgical history on file.No family history on file.Social History Socioeconomic History ? Marital status: Single   Spouse name: Not on file ? Number of children: Not on file ? Years of education: Not on file ? Highest education level: Not on file Tobacco Use ? Smoking status: Current Every Day Smoker   Types: Cigarettes Substance and Sexual Activity ? Alcohol use: Yes   Frequency: Monthly or less ED Other Social History E-cigarette/Vaping Substances E-cigarette/Vaping Devices Review of Systems Constitutional: Negative for activity change, appetite change, chills, diaphoresis and fever. Respiratory: Negative for chest tightness and shortness of breath.  Cardiovascular: Negative for chest pain, palpitations and leg swelling. Musculoskeletal: Positive for gait problem. Negative for back pain, neck pain and neck stiffness. Skin: Negative for color change. Neurological: Negative for dizziness and syncope. Psychiatric/Behavioral: Negative for confusion and decreased concentration. All other systems reviewed and are negative. Physical ExamED Triage Vitals [05/21/19 1623]BP: (!) 170/84Pulse: (!) 44Pulse from  O2 sat: n/aResp: 20Temp: 98.2 ?F (36.8 ?C)Temp src: OralSpO2: 99 % BP (!) 144/70  - Pulse (!) 48  - Temp 98.9 ?F (37.2 ?C)  - Resp 18  - SpO2 97% Physical ExamVitals signs and nursing note reviewed. Constitutional:     General: He is not in acute distress.   Appearance: Normal appearance. He is not ill-appearing, toxic-appearing or diaphoretic. HENT:    Head: Normocephalic and atraumatic.    Right Ear: External ear normal.    Left Ear: External ear normal. Eyes:    Extraocular Movements: Extraocular movements intact.    Conjunctiva/sclera: Conjunctivae normal.    Pupils: Pupils are equal, round, and reactive to light. Neck:    Musculoskeletal: Normal range of motion and neck supple. Cardiovascular:    Rate and Rhythm: Normal rate and regular rhythm.    Pulses: Normal pulses. Pulmonary:    Effort: Pulmonary effort is normal. No respiratory distress. Abdominal:    General: There is no distension. Musculoskeletal:       General: Swelling and tenderness present. No deformity or signs of injury.    Right hip: Normal.    Left hip: Normal.    Right knee: He exhibits decreased range of motion, swelling and effusion. He exhibits no ecchymosis, no deformity, no laceration, no erythema, normal alignment, no LCL laxity and normal patellar mobility. Tenderness found.    Left knee: Normal.    Right ankle: Normal.    Left ankle: Normal.    Right lower leg: No edema.    Left lower leg: No edema.      Legs:Skin:   General: Skin is warm and dry.    Capillary Refill: Capillary refill takes less than 2 seconds.    Findings: No  erythema. Neurological:    General: No focal deficit present.    Mental Status: He is alert and oriented to person, place, and time.    Cranial Nerves: No cranial nerve deficit.    Sensory: No sensory deficit. Motor: No weakness.    Coordination: Coordination normal. Psychiatric:       Mood and Affect: Mood normal.       Behavior: Behavior normal.       Thought Content: Thought content normal.       Judgment: Judgment normal.  ProceduresArthrocentesisPerformed by: Riley Lam, MDAuthorized by: Riley Lam, MD Pre-procedure Time OutIndications: joint swelling, pain, diagnostic evaluation and possible septic joint Body area: kneeJoint: right kneeLocal anesthesia used: yesAnesthesia: local infiltrationAnesthesia:Local anesthesia used: yesLocal Anesthetic: lidocaine 1% without epinephrineAnesthetic total: 3 mLSedation:Patient sedated: noPreparation: Patient was prepped and draped in the usual sterile fashion.Needle size: 18 GApproach: medialAspirate: cloudy,  blood-tinged and yellowAspirate amount: 30 mLPatient tolerance: patient tolerated the procedure well with no immediate complications ED COURSEInterpreted by ED Provider: pulse oximetry, x-ray, labs and ECGPatient Reevaluation: 6:06 PMPt is 64 yo M with ho gout pw co atraumatic R knee pain and swelling over the past few days without f/c. Pt reports a gout flare a couple of month ago. He started taking indomethacin for the knee but that hasn't help. Thus pt came to ED for eval. Pt was seen in PIT and noted to be bradycardic and diaphoretic though pt denies any symptoms of chest pain, sob, palpitations, dizziness or diaphoresis at the time or currently. Pt reports any diaphoresis noted at triage was likely attributable to his use of crutches. Pt denies any ho cardiac dysrhythmia, reporting a normal stress test last summer while in [place]. Pt has relocated to [place] from [place], arriving last fall.Pt is alert and in nad. Afebrile. PE as noted above. Acute R knee pain. Suspect acute inflammatory arthritis such as gout. Low suspicion for septic arthritis. Doubt DVT. Check xrs, labs. Provide pain meds prn.Cardiac dysrhythmia. Pt remains asymptomatic though his ekgs show intermittent NSR with arrythmia, possible 2nd degree avb (mobitz 1 or 2?). Serial trops, ekgs, tele, labs. No indication for anti-arhythmic meds at this point. However, given that pt has no pcp or cardiologist established, would recommend hospitalization for further eval and treatment. Will [facility] cardiology fellow (Dr Michelle Piper Haq)Monitor and re-eval.8:39 PMInitial studies reviewed. After consent from pt, informed of indication/risks/benefits of R knee arthrocentesis, procedure was done (refer to note) which pt tolerated well. Will defer abx as my suspicion for septic arthritis is low. Will treat with colchicine 1.2 mg followed by 0.6 mg while in the ED.Case discussed with cardiology fellow who agreed with hospitalization to tele for further eval. Formal consult in am unless condition changes.Case discussed with admitting [facility] resident.Patient progress: improvedLemi Bhavin Monjaraz, MD RDMS FACEPAttending PhysicianDepartment of Emergency MedicineClinical Impressions as of May 20 2313 Acute pain of right knee Gout, unspecified cause, unspecified chronicity, unspecified site Cardiac arrhythmia, unspecified cardiac arrhythmia type  ED DispositionAdmit Shivali Quackenbush, MD04/21/21 2049 Zyaire Mccleod, MD04/21/21 2315

## 2019-05-23 ENCOUNTER — Inpatient Hospital Stay: Admit: 2019-05-23 | Payer: PRIVATE HEALTH INSURANCE

## 2019-05-23 DIAGNOSIS — M109 Gout, unspecified: Secondary | ICD-10-CM

## 2019-05-23 DIAGNOSIS — I10 Essential (primary) hypertension: Secondary | ICD-10-CM

## 2019-05-23 DIAGNOSIS — Z20822 Contact with and (suspected) exposure to covid-19: Secondary | ICD-10-CM

## 2019-05-23 DIAGNOSIS — E669 Obesity, unspecified: Secondary | ICD-10-CM

## 2019-05-23 DIAGNOSIS — I441 Atrioventricular block, second degree: Secondary | ICD-10-CM

## 2019-05-23 DIAGNOSIS — J449 Chronic obstructive pulmonary disease, unspecified: Secondary | ICD-10-CM

## 2019-05-23 DIAGNOSIS — F17210 Nicotine dependence, cigarettes, uncomplicated: Secondary | ICD-10-CM

## 2019-05-23 DIAGNOSIS — I4510 Unspecified right bundle-branch block: Secondary | ICD-10-CM

## 2019-05-23 LAB — CBC WITH AUTO DIFFERENTIAL
BKR WAM ABSOLUTE IMMATURE GRANULOCYTES: 0 x 1000/ÂµL (ref 0.0–0.4)
BKR WAM ABSOLUTE LYMPHOCYTE COUNT: 1 x 1000/ÂµL (ref 0.5–5.4)
BKR WAM ABSOLUTE NRBC: 0 x 1000/ÂµL
BKR WAM ANALYZER ANC: 3.4 x 1000/ÂµL (ref 2.2–7.2)
BKR WAM BASOPHIL ABSOLUTE COUNT: 0 x 1000/ÂµL (ref 0.0–0.2)
BKR WAM BASOPHILS: 0.4 % (ref 0.0–2.0)
BKR WAM EOSINOPHIL ABSOLUTE COUNT: 0.2 x 1000/ÂµL (ref 0.0–0.4)
BKR WAM EOSINOPHILS: 4.5 % — ABNORMAL HIGH (ref 0.0–4.0)
BKR WAM HEMATOCRIT: 40.9 % (ref 39.0–55.0)
BKR WAM HEMOGLOBIN: 14.1 g/dL (ref 13.9–16.3)
BKR WAM IMMATURE GRANULOCYTES: 0.2 % (ref 0.0–0.4)
BKR WAM LYMPHOCYTES: 18.1 % (ref 10.0–50.0)
BKR WAM MCH (PG): 33.7 pg (ref 25.0–35.0)
BKR WAM MCHC: 34.5 g/dL (ref 33.0–37.0)
BKR WAM MCV: 97.6 fL — ABNORMAL HIGH (ref 80.0–94.0)
BKR WAM MONOCYTE ABSOLUTE COUNT: 0.7 x 1000/ÂµL (ref 0.1–1.2)
BKR WAM MONOCYTES: 13.2 % — ABNORMAL HIGH (ref 3.0–11.0)
BKR WAM MPV: 10.8 fL (ref 8.0–12.0)
BKR WAM NEUTROPHILS: 63.6 % (ref 45.0–90.0)
BKR WAM NUCLEATED RED BLOOD CELLS: 0 % (ref 0.0–0.0)
BKR WAM PLATELETS: 199 x1000/ÂµL (ref 120–450)
BKR WAM RDW-CV: 13.4 % (ref 11.5–14.5)
BKR WAM RED BLOOD CELL COUNT: 4.2 M/ÂµL — ABNORMAL LOW (ref 4.3–5.9)
BKR WAM WHITE BLOOD CELL COUNT: 5.3 x1000/ÂµL (ref 4.8–10.8)

## 2019-05-23 LAB — COMPREHENSIVE METABOLIC PANEL
BKR A/G RATIO: 1 (ref 1.0–2.2)
BKR ALANINE AMINOTRANSFERASE (ALT): 12 U/L (ref 9–59)
BKR ALBUMIN: 3.5 g/dL — ABNORMAL LOW (ref 3.6–4.9)
BKR ALKALINE PHOSPHATASE: 85 U/L (ref 9–122)
BKR ANION GAP: 10 (ref 7–17)
BKR ASPARTATE AMINOTRANSFERASE (AST): 21 U/L (ref 10–35)
BKR BILIRUBIN TOTAL: 0.5 mg/dL (ref <=1.2)
BKR BLOOD UREA NITROGEN: 17 mg/dL (ref 8–23)
BKR BUN / CREAT RATIO: 14.9 (ref 8.0–23.0)
BKR CALCIUM: 9.4 mg/dL (ref 8.8–10.2)
BKR CHLORIDE: 104 mmol/L (ref 98–107)
BKR CO2: 25 mmol/L (ref 20–30)
BKR CREATININE: 1.14 mg/dL (ref 0.40–1.30)
BKR EGFR (AFR AMER): >60 mL/min/{1.73_m2} (ref >60)
BKR EGFR (NON AFRICAN AMERICAN): >60 mL/min/{1.73_m2} (ref >60)
BKR GLOBULIN: 3.5 g/dL
BKR GLUCOSE: 92 mg/dL (ref 70–100)
BKR POTASSIUM: 3.9 mmol/L (ref 3.3–5.1)
BKR PROTEIN TOTAL: 7 g/dL (ref 6.6–8.7)
BKR SODIUM: 139 mmol/L (ref 136–144)

## 2019-05-23 LAB — MAGNESIUM: BKR MAGNESIUM: 2 mg/dL (ref 1.7–2.4)

## 2019-05-23 MED ORDER — CEPHALEXIN 500 MG CAPSULE
500 mg | ORAL_CAPSULE | Freq: Three times a day (TID) | ORAL | 1 refills | Status: AC
Start: 2019-05-23 — End: ?

## 2019-05-23 MED ORDER — PANTOPRAZOLE 40 MG TABLET,DELAYED RELEASE
40 mg | ORAL_TABLET | Freq: Every day | ORAL | 1 refills | Status: AC
Start: 2019-05-23 — End: 2020-01-19

## 2019-05-23 MED ORDER — PANTOPRAZOLE 40 MG TABLET,DELAYED RELEASE
40 mg | Freq: Every day | ORAL | Status: DC
Start: 2019-05-23 — End: 2019-05-23
  Administered 2019-05-23: 15:00:00 40 mg via ORAL

## 2019-05-23 MED ORDER — IBUPROFEN 600 MG TABLET
600 mg | ORAL_TABLET | Freq: Four times a day (QID) | ORAL | 1 refills | Status: AC | PRN
Start: 2019-05-23 — End: 2019-06-16

## 2019-05-23 NOTE — Plan of Care
Plan of Care Overview/ Patient Status    Assumed care 2330. Pt is calm and cooperative with care. Alert and oriented x4. On room air. Sinus rhythm on tele. Arrived on unit status post pacemaker placement. Dressing c/d/I. Pt reports bilateral knee pain 9/10. Requested ibuprofen. Dose too close to previous administration. PRN tylenol administered instead with minimal effect. Denies SOB. No acute distress noted. Assisted OOB to bathroom with crutches. Purposeful rounding. All care needs met. Wctm. Marita Kansas, RN4/23/2021

## 2019-05-23 NOTE — Discharge Instructions
ACTIVITY AND EXERCISE GUIDELINES: For about 4 weeks, avoid sudden jerking, pulling or chopping movements that pull your arm away from your body. For instance, you should not play golf for 6 weeks.       Do not push, pull, or lift anything over 5 lbs (that is how much a gallon of milk weighs) for the next week.?	FOR ONE WEEK Do not raise your LEFT arm above your shoulders > until you are seen in the office next week. Then the APRN will clear you to be able to fully use the arm.?	It is good to perform two different exercises with the left arm to prevent your left shoulder from becoming stiff:1) walk your left arm up the wall to shoulder height and back2) bend very slightly forward and perform small arm circles 10 times in each direction.YOU CAN shower with the original PLASTIC type bandage on, as long as the bandage is WELL sealed to chest- keep showers brief.-Use a hand directed shower if you have one to avoid direct flow onto bandage/incision. -ONCE you remove the original bandage -you can shower once the incision appears to have no  gaps in it. -NO tub, pool, bath, lake etc for at least 4 weeks to prevent infection-Monday 4/26: WASH  HANDS VERY WELL> THEN carefully remove the outer bandage and gauze. There are surgical staples that will be removed in the office in a week or so. -It may take several weeks for the incision to heal. If you have bruising, it may take several weeks to reabsorb. The bruising can extend into the armpit and side of the chest. IF you develop new bruising after you are home, call office as this can be sign of bleedingTOMORROW you can carefully remove the tape and the two large gauze pads. Then wait until Monday to remove the actual bandage as above

## 2019-05-23 NOTE — H&P
[facility]	Internal Medicine History & Physical Exam NoteHospital Day: 1 Attending Provider: Tretha Sciara, MD [phone]&P: PC:  Right knee painHPC: Pt is 64 yo M with ho gout presenting with complaints of atraumatic R knee pain and swelling over the past few days without fever or chills. Pt reports a gout flare a couple of month ago. He started taking indomethacin for the knee but that hasn't help. Thus pt came to ED for eval.  On arrival to the ED the patient underwent arthrocentesis of the right knee and now reports to me that this pain and swelling are much improved.Pt was also noted to be bradycardic and diaphoretic though pt denies any symptoms of chest pain, sob, palpitations, dizziness or diaphoresis at the time or currently.  Pt denies any ho cardiac arrhythmia, reporting a normal stress test last summer while in [place].  Patient's denies history of recent tick bite, symptoms of hypothyroidism, medications that may lower his heart rate.  History is positive however for significant alcohol consumption.ED vitals-afebrile, pulse 40 to 50, respiratory rate 18, blood pressure 140 to 170/ 70, saturating 96% on room air.Pertinent labs, potassium 4.1, BUN 19, creatinine 1.26, calcium 10.1, magnesium 1.7, uric acid 7.8, troponin less than 0.01, CBC with no leukocytosis and otherwise unremarkable.ECG with second-degree heart block, Mobitz type 2Right knee x-ray: Large knee effusion.Questionable calcified intra-articular body versus vascular calcification.Synovial fluid analysis:  Turbid, White cell count 21,000, neutrophils 95%, RBCs 2000. Grams stain +3 wbc's, +2 RBCs, no organisms.  Culture pending.Cardiology was consulted who recommended hospitalization with telemetry monitoring and further evaluation tomorrow in the setting of second-degree heart block.  Patient was admitted to team care for further management.?PMHx: Gout.  Otherwise patient reports no other past medical history that he is aware of.DHx:  Indomethacin as needed for pain reliefFHx:  Patient is unaware of any illnesses that run in his first-degree relatives.PSHx:  NoneSHx:  Patient moved from [place] to [place] last fall.  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  He reports alcohol consumption up to 3 drinks (beer) per day.  Last drink was on Saturday last week.  Current every day smoker, smokes about a quarter of a pack a day for several years.  Denies any other illicit drug use.No past medical history on file.No Known AllergiesNo past surgical history on file.Social History Socioeconomic History ? Marital status: Single   Spouse name: Not on file ? Number of children: Not on file ? Years of education: Not on file ? Highest education level: Not on file Tobacco Use ? Smoking status: Current Every Day Smoker   Types: Cigarettes Substance and Sexual Activity ? Alcohol use: Yes   Frequency: Monthly or less Review of Systems Constitutional: Negative.  HENT: Negative.  Eyes: Negative.  Respiratory: Negative.  Cardiovascular: Negative.  Gastrointestinal: Negative.  Genitourinary: Negative.  Musculoskeletal: Positive for joint pain. Skin: Negative.  Neurological: Negative.  Endo/Heme/Allergies: Negative.  Psychiatric/Behavioral: Negative.  Current Scheduled Medications Current IV  Current Facility-Administered Medications Medication Dose Route Frequency Provider Last Rate Last Admin ? colchicine (COLCRYS) tablet 0.6 mg  0.6 mg Oral BID Ivar Drape, MD     ? enoxaparin (LOVENOX) syringe 40 mg  40 mg Subcutaneous Q24H Ivar Drape, MD   40 mg at 05/22/19 0022 ? sodium chloride 0.9 % flush 3 mL  3 mL IV Push Q8H Mychele Seyller, MD      ------------------------------------------------------------PRN: acetaminophen, dextrose 50% in water (D50W), dextrose 50% in water (  D50W), Enteral Hypoglycemia Management if Blood Glucose 50 - 69 mg/dL or 70 - 79 mg/dL with symptoms of Hypoglycemia, Enteral Hypoglycemia Management if Blood Glucose less than 50 mg/dL, glucagon, ibuprofen, sodium chloride Objective data: Vitals:Temp:  [98.2 ?F (36.8 ?C)-98.9 ?F (37.2 ?C)] 98.7 ?F (37.1 ?C)Pulse:  [41-48] 41Resp:  [18-20] 18BP: (144-170)/(70-84) 144/70SpO2:  [96 %-99 %] 96 %SpO2:  [96 %-99 %] 96 % (04/22 0019) Physical Exam Constitutional: He is oriented to person, place, and time and well-developed, well-nourished, and in no distress. HENT: Head: Normocephalic and atraumatic. Eyes: Pupils are equal, round, and reactive to light. Conjunctivae are normal. Neck: Normal range of motion. Neck supple. Cardiovascular: Normal heart sounds and intact distal pulses. Exam reveals no gallop and no friction rub. No murmur heard.Bradycardic (40) Pulmonary/Chest: Effort normal and breath sounds normal. Abdominal: Soft. Bowel sounds are normal. Musculoskeletal:    Comments: Right knee swollen compared to the leftErythema and warmth over the right kneeMinimal tenderness to palpationMinimal right knee effusionLimited range of motion in the right knee joint due to painNo inflammatory changes noted in any other jointNo tophaceous deposits visible Neurological: He is alert and oriented to person, place, and time. Skin: Skin is warm and dry. Psychiatric: Affect and judgment normal. LABS:72h CBCRecent Labs   04/21/211655 WBC 6.8 HGB 14.9 PLT 216 MCV 97.7*  72h CoagRecent Labs   04/21/211726 LABPROT 11.2 INR 1.00 PTT 30.3  72h BMPRecent Labs   04/21/211655 NA 140 K 4.1 CL 101 CO2 28 ANIONGAP 11 CALCIUM 10.1 MG 1.7 BUN 19 CREATININE 1.26 CrCl cannot be calculated (Unknown ideal weight.). 72h CardiacNo results for input(s): TROPONINI, CKTOTAL, CKMB, CKMBINDEX, BNPPRO in the last 72 hours. 72h LFTRecent Labs   04/21/211655 AST 20 ALT 15 ALKPHOS 114 ALBUMIN 3.9 PROT 7.6 BILITOT 0.3  72h EndoNo results for input(s): CORTISOL, HGBA1C, TSH, FREET4, T3TOTAL, LDL, HDL, CHOL, TRIG in the last 72 hours. BG 72 hrs Recent Labs   04/21/211655 04/21/211731 GLU 109* 105* EKG:  Sinus rhythm with second-degree AV block Mobitz type 2 and right bundle branch blockRecent pertinent imaging:Xr Chest Pa Or ApResult Date: 4/21/2021IMPRESSION:  Mild interstitial prominence with no focal consolidation.. Reported and signed by:  Rema Jasmine, MDXr Knee 4v RightResult Date: 4/21/2021IMPRESSION:  Large knee effusion. Questionable calcified intra-articular body versus vascular calcification. Recommend MR imaging. Reported and signed by:  Rema Jasmine, MDMicrobiology:4/21 COVID-19 negative4/21 Synovial fluid culture pendingAssessment and plan: 64 yo M with ho gout presenting with complaints of atraumatic R knee pain and swelling over the past few days without fever or chills, synovial fluid analysis consistent with inflammatory arthritis likely acute gout flare and incidentally noted to have a second-degree Mobitz type 2 heart block, admitted for further workup.Problems:# right knee swelling/pain:  Synovial fluid analysis consistent with inflammatory arthritis.  No concern for septic arthritis at this point (synovial fluid nucleated cell count approximately 20,000, no signs of systemic infection).  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  Other possibilities for inflammatory but non septic arthritis that are lower on the differential include rheumatoid arthritis, psoriatic arthritis, Lyme arthritis, tuberculous arthritis, fungal arthritis.PLANFollow-up on synovial fluid cultureFollow up on ESR/CRPInitiate treatment with ibuprofen 800 milligrams t.i.d. there appear to be no contraindications to treatment with NSAIDs other than possible mild AKI ( BUN 19/creatinine 1.26, unknown what the baseline is)Consider low-dose colchicine or allopurinol for gout prophylaxis closer to dischargeMonitor urine output dailyTrend BUN creatinine  while on NSAIDs# second-degree Mobitz type 2 heart block: ?  Etiology.  Unable to identify any reversible cause.  TSH normal, no evidence of hyperkalemia and patient not on any beta-blockers. ?  Lyme carditis.  Patient hemodynamically stable and asymptomatic at the moment. Plan to monitor over telemetry overnightFollow-up on Lyme serologyPlan to keep pacer pads and atropine at bedside given the high risk of progression to symptomatic second-degree heart block or complete heart block.Cardiology consult, ?  Permanent pacemaker placement if and when.Electronically signed RU:EAVWUJ WJ:XBJYNW Gaylyn Rong, MDIM Resident, [id number]:30 AM4/22/2021PCP: Pcp, No NoneOther: 1.	Admit: Team A2.	Vitals:  Every 8 hours3.	Diet: Diet Cardiac 4.	Consults: IP CONSULT TO CARDIOLOGY 5.	DVT Prophylaxis:    Lovenox 40 milligrams every 24 hours6.	Bowel regimen: senna-docusate (SENNA LAXATIVE)7.	Restraints: No 8.	Code Status: Full Code/ACLS

## 2019-05-23 NOTE — Plan of Care
Plan of Care Overview/ Patient Status    CM Discharge Planning    Most Recent Value Discharge Planning Assessment of the spouse/caregiver's ability and willingness to provide care for the patient  Lives with [place]. Concerns to be Addressed  financial/insurance, no PCP Barriers to Discharge  inadequate/no insurance, inadequate/no prescription coverage Referral(s) placed for:  -- [SW] CM D/C Readiness PASRR completed and approved  N/A Authorization number obtained, if required  N/A Is there a 3 day INPATIENT Qualifying stay for Medicare Patients?  N/A Medicare IM- signed, dated, timed and scanned, if required  N/A DME Authorized/Delivered  N/A No needs identified/ follow up with PCP/MD  Yes Post acute care services secured W10 complete  N/A Finalized Plan Expected Discharge Date  05/23/19 Discharge Disposition  Home or Self Care Post acute care services secured W10 complete  N/A Physician documentation required  AVS (Patient Instructions)  Andres Shad RNCare Coordination 04/22/ 2021FLOATNE10 & SICU  rooms [phone]  [phone] or text 8am - 4:30pm

## 2019-05-23 NOTE — Other
Brett [name] was discharged via Private Car accompanied by [employer].  Acknowledged understanding of discharge instructions and recommended follow up care as per the after visit summary.  Written discharge instructions and education provided. Tele and IV access removed. Denies any further questions. Vital signs    Vitals:  05/22/19 2200 05/22/19 2320 05/23/19 0537 05/23/19 0751 BP: 139/86 (!) 141/84 (!) 149/84 (!) 146/81 Pulse: 85 82 71 75 Resp:  18 18 17  Temp:  97.8 ?F (36.6 ?C) 97.5 ?F (36.4 ?C) 97.4 ?F (36.3 ?C) TempSrc:  Oral Oral Oral SpO2: 95% 98% 98% 97% Weight:     [name], RN4/23/2021

## 2019-05-23 NOTE — Progress Notes
HPI reviewed.Patient had dual-chamber pacemaker implanted yesterday.I spent time with the patient reviewing post discharge care.  As well as the indication for the pacemaker showing him EKGs pre and post in give him reading material on pacemakersHe feels well.  The left pectoral area is slightly swollen but soft.Had the RN who saw him last night come over and take a look she thinks looks about the same.  I did hold manual pressure for about 5 minutes each which actually did soften it a bit more and decrease in size just a bit.  I applied a pressure bandage in S the patient is leaving on overnight and to minimize left arm movementI will communicate this to Dr. Arman Bogus and the nurse practitioner in the office who will see him next weekElectronically Signed by Letha Cape, NP, May 23, 2019

## 2019-05-23 NOTE — Plan of Care
Plan of Care Overview/ Patient Status    1900-Patient resting in bed, s/p pacemaker implant to left upper chest. Incision site covered with gauze and tegaderm dressing. Remains CDI. No swelling noted. Ice pack over incision site. Patient reports mild discomfort to site but tolerable.Patient is alert and oriented x4. On room air. NSR/v-paced on tele monitor. C/o gout pain 3-4/10 to bilateral knees. Remains on bedrest until 2300.2230- Report called to RN on NE10. 2300- Bedrest complete. Patient ambulated to bathroom and voided. Ambulates with limp due to gout discomfort. Patient taken to NE10 bed via wheelchair with personal belongings. Reviewed arm movement restrictions with patient for discharge.

## 2019-05-23 NOTE — Plan of Care
Plan of Care Overview/ Patient Status    Assumed patient care at 0700. Patient is alert and oriented x4, on tele-SR, room air with lungs clear. Patient ambulates in room with an assist x1, no SOB or pain present. Patient incision site from pacemaker is c/d/I. Patient medicated per [facility], safety precautions in place: bed in lowest position, bed alarm on, encouraged call light use within reach. Rodman Pickle, RN4/23/2021

## 2019-05-23 NOTE — Significant Event
I spoke with Dr. Sherin Quarry this morning. From a cardiology standpoint, he can be discharged today with Keflux 500mg  TID x 3 days and device follow-up in his clinic next week. He was scheduled on 4/27 at 2:15pm. Willaim Sheng, MDInternal Medicine, JYN-829:[id number] AM4/23/2021

## 2019-05-27 MED ORDER — INDOMETHACIN 25 MG CAPSULE
25 | ORAL | 1.00 refills | 12.00000 days | Status: AC | PRN
Start: 2019-05-27 — End: 2019-06-16

## 2019-06-02 NOTE — Discharge Summary
Med/Surg Discharge SummaryPatient Data:  Patient Name: Brett [name] Age: 64 y.o. DOB: 06-Dec-1955	 MRN: [id number]	 Admit date: 05/21/2019     Discharge date: 05/23/19 Discharge Attending Physician: Tretha Sciara, MD  Principal Diagnosis: Acute pain of right kneeOther Diagnosis:Patient Active Problem List Diagnosis ? Acute pain of right knee ? Heart block AV second degree Discharged Condition: fairDisposition: Home Admission Summary & Hospital Course: This is 64 y.o. male with PMH of gout (diagnosed around 6 years ago) who presented to [facility] ED on 05/21/2019 c/o atraumatic R knee pain and swelling over the past few days before admission without fever or chills. Patient reports gout flare a couple of months ago. He started taking indomethacin for the knee pain but that hasn't help and for that reason he came to ED. On arrival to the ED the patient underwent arthrocentesis of the right knee. In addition he was noted to be bradycardic to 40-50. Was admitted to the medicine floor for gout flare. # Gout flare. R knee synovial fluid analysis shows WBC 21,175 with monosodium urate crystals. Culture is negative but the report is preliminary. Labs shows uric acid 7.8, no tophi on physical exam. The patient reports having a few gout attacks per year and seems never been started on urate lowering agents. Risks and benefits were reviewed and the patient was started on ibuprofen 800 mg po TID with good effect in addition to PPI ppx. Planning to start urate lowering therapy with allopurinol at least 2 weeks after a gout flare subsides to reach and maintain <6 mg/dL. # Bradycardia. In ED the patient was found to be bradycardic with HR 40-50s and diaphoretic, however he was asymptomatic at this time, denying any chest pain, SOB, dizziness or palpitations. Vitals were otherwise stable. ECG shows 2:1 AV heart block iwht mild QRS prolongations and RBBB. Patient was placed on tele with pads on and atropine at bedside. Patients Lyme disease antibodies are negative. Due to persistent bradycardia and high degree AV block the bipolar pacemaker was placed on 4/ 22/2021 without any complications. Was started on Keflex ppx for 3 days. The patient is asymptomatic and cleared by cardiology for discharge. It is therefore believed that patient is stable to be discharged Home  with close outpatient follow up.Consultants:IP CONSULT TO CARDIOLOGYIP CONSULT TO SOCIAL WORKIP CONSULT TO SOCIAL WORK Patient History PMH PSH No past medical history on file. No past surgical history on file. Discharge Physical Exam:Vitals:Temp:  [97.4 ?F (36.3 ?C)-99.2 ?F (37.3 ?C)] 97.4 ?F (36.3 ?C)Pulse:  [40-89] 75Resp:  [16-18] 17BP: (130-171)/(81-98) 146/81SpO2:  [95 %-99 %] 97 %SpO2:  [95 %-99 %] 97 % (04/23 0751) on room airNIBP MAP (mmHg): 103Physical Exam GEN: NAD, AAO x 3Neck: no JVD, neck suppleChest: lungs CTAHeart: RR, nl S1/S2, no MRGAbd: soft, NT, ND; BS+Ext: R knee swollen with erythema and warmth; limited ROM. No tophaceous deposits visibleNero: no focal deficitsPertinent lab findings and test results: CBC of the week Recent Labs Lab 04/21/211655 04/22/210534 04/23/210904 WBC 6.8 5.7 5.3 HGB 14.9 13.9 14.1 PLT 216 200 199 MCV 97.7* 97.3* 97.6* SEDRATE  --  49*  --  BMP of the weekRecent Labs Lab 04/21/211655 04/22/210534 04/23/210904 NA 140 138 139 K 4.1 3.7 3.9 CL 101 102 104 CO2 28 27 25  ANIONGAP 11 9 10  CALCIUM 10.1 9.5 9.4 MG 1.7 2.0  --  BUN 19 19 17  CREATININE 1.26 1.13 1.14 CrCl cannot be calculated (Unknown ideal weight.).LFT of the week Recent Labs Lab 04/21/211655 04/23/210904 AST 20 21 ALT 15 12  ALKPHOS 114 85 ALBUMIN 3.9 3.5* PROT 7.6 7.0 BILITOT 0.3 0.5 Coag of the weekRecent Labs Lab 04/21/211726 LABPROT 11.2 INR 1.00 PTT 30.3 Metabolic of the week Recent Labs Lab 04/22/210534 HGBA1C 5.6 TSH 2.560 LDL 71 HDL 46 CHOL 142 TRIG 124 72h GlucometerRecent Labs   04/21/211655 04/21/211731 04/22/210534 04/22/211240 04/22/211348 04/22/212005 04/23/210904 GLU 109* 105* 101* 83 76 163* 92 UA of the weekRecent Labs Lab 04/22/210534 SPECGRAV 1.029 PHUR 6.0 LEUKOCYTESUR Negative NITRITE Negative PROTEINUA Trace GLUCOSEU Negative KETONESU Negative BILIRUBINUR Negative UROBILINOGEN <2.0 Imaging: Xr Chest Pa Or Ap - PortableResult Date: 4/22/2021IMPRESSION:  Placement of a left chest wall pacemaker with leads in appropriate position and no pneumothorax. Reported and signed by:  Lorenso Courier, MDXr Chest Pa Or ApResult Date: 4/21/2021IMPRESSION:  Mild interstitial prominence with no focal consolidation.. Reported and signed by:  Rema Jasmine, MDXr Chest Pa And LateralResult Date: 4/23/2021IMPRESSION:  No radiographic evidence of acute abnormalities. No significant change. Reported and signed by:  Marica Otter, MDXr Knee 4v RightResult Date: 4/21/2021IMPRESSION:  Large knee effusion. Questionable calcified intra-articular body versus vascular calcification. Recommend MR imaging. Reported and signed by:  Rema Jasmine, MDPending Labs and Tests: Pending Lab Results   Order Current Status  Body fluid culture Preliminary result  Issues to be addressed post discharge:- follow up with PCP, start urate lowering therapy with allopurinol when acute flare resolves - continue NSAIDs and PPI ppx for acute flare - follow up with cardiology for pacemaker Discharge Medications: Current Discharge Medication List  START taking these medications  Details cephALEXin (KEFLEX) 500 mg capsule Take 1 capsule (500 mg total) by mouth 3 (three) times daily for 3 days.Qty: 9 capsule, Refills: 0Start date: 05/23/2019, End date: 05/26/2019  ibuprofen (ADVIL,MOTRIN) 600 mg tablet Take 1 tablet (600 mg total) by mouth every 6 (six) hours as needed.Qty: 60 tablet, Refills: 0Start date: 05/23/2019  pantoprazole (PROTONIX) 40 mg tablet Take 1 tablet (40 mg total) by mouth daily.Qty: 30 tablet, Refills: 0Start date: 05/23/2019   Follow-up Information:[facility] [id number] [facility] [place] [place] [phone] on 5/6/2021Please call for hospital discharge appointment via MyChart Video at 1pm on 06/05/2019.Electronically Signed:Brittnei Jagiello [place], MD4/23/2021

## 2019-06-04 LAB — BODY FLUID CULTURE     (BH GH L LMW YH)
BKR BODY FLUID CULTURE: NO GROWTH
BKR GRAM STAIN (ABNORMAL): NONE SEEN

## 2019-06-05 ENCOUNTER — Encounter
Admit: 2019-06-05 | Payer: PRIVATE HEALTH INSURANCE | Attending: Student in an Organized Health Care Education/Training Program | Primary: Internal Medicine

## 2019-06-10 ENCOUNTER — Encounter
Admit: 2019-06-10 | Payer: PRIVATE HEALTH INSURANCE | Attending: Student in an Organized Health Care Education/Training Program | Primary: Internal Medicine

## 2019-06-10 ENCOUNTER — Ambulatory Visit: Admit: 2019-06-10 | Payer: PRIVATE HEALTH INSURANCE | Attending: Internal Medicine | Primary: Internal Medicine

## 2019-06-10 DIAGNOSIS — F1010 Alcohol abuse, uncomplicated: Secondary | ICD-10-CM

## 2019-06-10 DIAGNOSIS — M109 Gout, unspecified: Secondary | ICD-10-CM

## 2019-06-10 DIAGNOSIS — F17200 Nicotine dependence, unspecified, uncomplicated: Secondary | ICD-10-CM

## 2019-06-10 DIAGNOSIS — I441 Atrioventricular block, second degree: Secondary | ICD-10-CM

## 2019-06-10 MED ORDER — ALLOPURINOL 100 MG TABLET
100 mg | ORAL_TABLET | Freq: Every day | ORAL | 1 refills | Status: AC
Start: 2019-06-10 — End: 2020-01-19

## 2019-06-10 NOTE — Patient Instructions
Medical Marijuana CertificationCT State Dept of Consumer ProtectionPlease obtain additional information regarding the process online at: [email or web address] [facility] does not have any current providers who are registered to certify patients for Medical Marijuana. Qualifying patients can be referred to another provider/facility for further evaluation. We do not provide referrals for patients who do not have one of the conditions listed below, however, you may still request your medical records from [facility] to be released to another provider at any time for any purpose.[place] law explicitly states that evaluations for medical marijuana and/or marijuana products are NOT COVERED BY INSURANCE. In general, out-of-pocket costs include:?	Initial consultation fees (average fees range from $150 - $250, depending on provider)?	[place] Dept of Consumer Protection registration fee of $100?	The cost of any prescription products from a licensed dispensaryIn order to qualify, you must be a resident of [place] (with proof of residency) and currently undergoing treatment for one of the medical conditions defined by [employer]. The condition must be debilitating and alternative treatments must be tried before medical marijuana is recommended. Qualifying conditions include:Cancer Glaucoma HIV/AIDS Parkinson's Disease Multiple Sclerosis Spinal Cord Damage with Intractable Spasticity Epilepsy or other intractable seizure disorder Terminal Illness or End-of-Life Care Wasting Syndrome Crohn's Disease PTSD Sickle Cell Disease Post-Laminectomy Syndrome Severe Psoriasis and Psoriatic Arthritis Amyotrophic Lateral Sclerosis Complex Regional Pain Syndrome Ulcerative Colitis Cerebral Palsy Cystic Fibrosis Cachexia Fibromyalgia Hydrocephalus  Intractable Headache Syndrome Neuropathic Facial Pain Muscular Dystrophy Osteogenesis Imperfecta Degenerative Spinal Disorder Post-herpetic Neuralgia You CANNOT register in the state system without certification from a physician. Information regarding which physicians have, or will, certify patients for medical marijuana is being treated with the utmost level of confidentiality and there is no current list of medical marijuana doctors. The physician must assert that the potential benefits of the palliative use of marijuana would likely outweigh the health risks, and regular follow up must be maintained. After physician certification and patient completion of the online application with payment of all fees, the Department of Consumer Protection will determine if a certification will be granted.

## 2019-06-11 DIAGNOSIS — M1A0610 Idiopathic chronic gout, right knee, without tophus (tophi): Secondary | ICD-10-CM

## 2019-06-11 DIAGNOSIS — Z0000 Encounter for general adult medical examination without abnormal findings: Secondary | ICD-10-CM

## 2019-06-16 ENCOUNTER — Ambulatory Visit: Admit: 2019-06-16 | Payer: PRIVATE HEALTH INSURANCE | Attending: Internal Medicine | Primary: Internal Medicine

## 2019-06-16 ENCOUNTER — Encounter: Admit: 2019-06-16 | Payer: PRIVATE HEALTH INSURANCE | Primary: Internal Medicine

## 2019-06-16 ENCOUNTER — Telehealth
Admit: 2019-06-16 | Payer: PRIVATE HEALTH INSURANCE | Attending: Student in an Organized Health Care Education/Training Program | Primary: Internal Medicine

## 2019-06-16 DIAGNOSIS — M109 Gout, unspecified: Secondary | ICD-10-CM

## 2019-06-16 DIAGNOSIS — I441 Atrioventricular block, second degree: Secondary | ICD-10-CM

## 2019-06-16 MED ORDER — INDOMETHACIN 25 MG CAPSULE
25 mg | ORAL_CAPSULE | 2 refills | Status: AC
Start: 2019-06-16 — End: 2020-01-19

## 2019-06-16 NOTE — Telephone Encounter
Error

## 2019-06-16 NOTE — Patient Instructions

## 2019-06-17 DIAGNOSIS — M10071 Idiopathic gout, right ankle and foot: Secondary | ICD-10-CM

## 2019-06-17 DIAGNOSIS — M1A0610 Idiopathic chronic gout, right knee, without tophus (tophi): Secondary | ICD-10-CM

## 2019-06-23 ENCOUNTER — Inpatient Hospital Stay: Admit: 2019-06-23 | Discharge: 2019-06-23 | Payer: PRIVATE HEALTH INSURANCE | Primary: Internal Medicine

## 2019-06-23 DIAGNOSIS — M10071 Idiopathic gout, right ankle and foot: Secondary | ICD-10-CM

## 2019-06-23 LAB — URIC ACID: BKR URIC ACID: 9.7 mg/dL — ABNORMAL HIGH (ref 2.7–7.3)

## 2019-07-01 ENCOUNTER — Ambulatory Visit
Admit: 2019-07-01 | Payer: PRIVATE HEALTH INSURANCE | Attending: Student in an Organized Health Care Education/Training Program | Primary: Internal Medicine

## 2019-07-01 MED ORDER — COLCHICINE 0.6 MG TABLET
0.6 mg | ORAL_TABLET | Freq: Every day | ORAL | 1 refills | Status: AC
Start: 2019-07-01 — End: 2020-01-19

## 2019-07-01 NOTE — Patient Instructions
-   Commence taking Allopurinol 100mg  daily- Commence Colchicine 0.6mg  daily to prevent further gout attacks while re-commencing Allopurinol- Colchicine may give you diarrhea, if that occurs, let the clinic know- Return to clinic in 2 months. We will have blood tests done at that visit.

## 2019-07-02 DIAGNOSIS — M109 Gout, unspecified: Secondary | ICD-10-CM

## 2019-07-02 DIAGNOSIS — I441 Atrioventricular block, second degree: Secondary | ICD-10-CM

## 2019-07-02 NOTE — Progress Notes
[facility]	TELEPHONE VISITFor this visit the clinician and patient were present via telephone (audio only).Patient counseled on available options for visit type; Patient elected telephone visit; Patient consent given for telephone visit: YesPatient Identity was confirmed during this call.   Other individuals actively participating in the telephone encounter and their name/relation to the patient: none Total time spent in medical telephone consultation: 10 minutes Because this visit was completed over telephone, a hands-on physical exam was not performed.  Patient understands and knows to call back if condition changes.  The visit type for this patient required modifications due to the COVID-19 outbreakReason for telephone visit:AppointmentHPI:64 y/o M with 2nd degree AV block and RBBB s/p PPM and hx of recurrent gout who was just on admission in 05/2019 for an acute gout flare treated with Indomethacin.He has no complains.He reports complete resolution of gouty pain. He is yet to commence Allopurinol.Review of systems:Negative except as aboveMedical HistoryPast Medical History:Past Medical History: Diagnosis Date ? 2nd degree AV block  ? Gout  Past Surgical History:No past surgical history on file.Social History:Social History Tobacco Use ? Smoking status: Current Every Day Smoker   Packs/day: 1.00   Years: 50.00   Pack years: 50.00   Types: Cigarettes Substance Use Topics ? Alcohol use: Yes   Alcohol/week: 42.0 standard drinks   Types: 42 Cans of beer per week   Frequency: Monthly or less   Comment: drinks a 6 pack of beer daily Family History:No family history on file.Allergies:No Known AllergiesCurrent medications:Current Medications Medication Sig ? allopurinoL (ZYLOPRIM) 100 mg tablet Take 1 tablet (100 mg total) by mouth daily. ? indomethacin (INDOCIN) 25 mg capsule 2 cap 3 times a day for 3 days then 1 cap three times a day ? pantoprazole (PROTONIX) 40 mg tablet Take 1 tablet (40 mg total) by mouth daily. Assessment and plan# Recurrent gout- Just experienced an acute flare that has resolved following treatment with Indomethacin- Discontinue Indomethacin- Commence Allopurinol 100mg  daily- Commence Colchicine 0.6mg  daily to prevent acute episode following commencement of Allopurinol- Will require uric acid check at next in-person visitFollow-up in 2 months with in-person visit.Patient discussed with Dr. Princess Bruins.Signed:Osinachi Navarrette, MD, PhDIM Resident, PGY3Pager #: Heartbeat: [phone] AM6/03/2019

## 2019-07-04 ENCOUNTER — Telehealth: Admit: 2019-07-04 | Payer: PRIVATE HEALTH INSURANCE | Attending: Internal Medicine | Primary: Internal Medicine

## 2019-07-04 NOTE — Telephone Encounter
Patient had a tele visit on 06/01Medication is not working. States he still in pain and back on crutches.  He was calling for an appt nothing available. Didn't want to wait for video visit next week.

## 2019-07-04 NOTE — Telephone Encounter
Spoke with pt He will go to the ED for evaluation of left knee fluid built uphe states

## 2019-07-04 NOTE — Telephone Encounter
Spoke with pt Brett [name] he went to work he had cramp left knee area Throughout the day left knee with swelling Today it is the size of grapefruit in the left knee area He has a cramp every time he stands He is using crutches Pt did not start colchicine that was ordered 6/1He was not aware colchicine was ordered He is asking should he take the indocin Routing to provider for advise

## 2019-07-04 NOTE — Telephone Encounter
He can take the Indocin.  However, if he has a swelling the size of the grape fruit then he should go to the ER.Electronically Signed by Liane Comber, MD, July 04, 2019

## 2019-07-04 NOTE — Telephone Encounter
Left VM for pt informed he can take indocin but if swelling is the size of grapefruit then he needs to go to ED Left VM to call back to confirm he received this message

## 2019-07-05 ENCOUNTER — Inpatient Hospital Stay: Admit: 2019-07-05 | Discharge: 2019-07-05 | Payer: PRIVATE HEALTH INSURANCE

## 2019-07-05 ENCOUNTER — Encounter: Admit: 2019-07-05 | Payer: PRIVATE HEALTH INSURANCE | Attending: Mental Health | Primary: Internal Medicine

## 2019-07-05 ENCOUNTER — Emergency Department: Admit: 2019-07-05 | Payer: PRIVATE HEALTH INSURANCE | Primary: Internal Medicine

## 2019-07-05 DIAGNOSIS — F17210 Nicotine dependence, cigarettes, uncomplicated: Secondary | ICD-10-CM

## 2019-07-05 DIAGNOSIS — M25462 Effusion, left knee: Secondary | ICD-10-CM

## 2019-07-05 DIAGNOSIS — I441 Atrioventricular block, second degree: Secondary | ICD-10-CM

## 2019-07-05 DIAGNOSIS — M25562 Pain in left knee: Secondary | ICD-10-CM

## 2019-07-05 DIAGNOSIS — M109 Gout, unspecified: Secondary | ICD-10-CM

## 2019-07-05 DIAGNOSIS — Z79899 Other long term (current) drug therapy: Secondary | ICD-10-CM

## 2019-07-05 DIAGNOSIS — Z950 Presence of cardiac pacemaker: Secondary | ICD-10-CM

## 2019-07-05 LAB — BODY FLUID CELL COUNT, WAM     (BH YH)
BKR WAM RBC FLUID: 32000 {cells}/uL — ABNORMAL HIGH
BKR WAM TOTAL COUNT BODY FLUIDS: 34885 {cells}/uL
BKR WAM WBC FLUID: 34680 {cells}/uL

## 2019-07-05 LAB — BODY FLUID DIFF     (BH)
BKR CWS LYMPHS FLUID: 2 %
BKR MONOCYTE, FLUID: 4 %
BKR SEGS MAN FLUID: 94 %

## 2019-07-05 LAB — BODY FLUID CELL COUNT     (BH GH LMW YH)

## 2019-07-05 LAB — GLUCOSE, BODY FLUID     (BH GH L LMW YH): BKR GLUCOSE FLUID: 99 mg/dL

## 2019-07-05 LAB — SYNOVIAL FLUID, CRYSTAL

## 2019-07-05 MED ORDER — LIDOCAINE HCL 10 MG/ML (1 %) INJECTION SOLUTION
10 mg/mL (1 %) | Freq: Once | INTRADERMAL | Status: CP
Start: 2019-07-05 — End: ?
  Administered 2019-07-05: 14:00:00 10 mL via INTRADERMAL

## 2019-07-05 NOTE — ED Notes
9:08 AM Pt presents to ED for gout flair up. Per pt was on indomethacin and switched to allopurinol recently r/t pacemaker. Inc swelling and pain in left knee since thurs. Pt awake alert, difficulty w ambulation noted, significant swelling left knee swelling noted, warm to touch, ++ pedal pulse. Denies fall/trauma to knee

## 2019-07-05 NOTE — Discharge Instructions
Be sure to follow-up with your primary care clinic to straight not your meds.  If you develop fever increased swelling or pain to knee return to the ER immediately.

## 2019-07-10 NOTE — Progress Notes
Adult Urgent Visit NoteHPI:The patient is a 64 y.o. male with a PMH of Gout, 2nd degree AV block requiring a pacemaker (05/22/19) who presents to the clinic for a refill on his indomethacin as he was having a gout flare. The patient had recently had been seen through telehealth on 06/10/2019 and was feeling well, his most recent uric acid level was 7.8 and the patient was started on allopurinol. However, on Thursday (06/12/2019) the patient had a gout flare up in his right toe and left ankle. He was taking his indomethacin on Thursday and Friday but did not have anymore over the weekend which made the pain unbearable and prompted the patient to come to the clinic as soon as possible. His previous gout flare ups were well controlled on indomethacin and he wanted a refill.  Patient History?	Past Medical History: has a past medical history of 2nd degree AV block and Gout.?	Allergies:has No Known Allergies. Medications: Current Outpatient Medications Medication Sig ? allopurinoL Take 1 tablet (100 mg total) by mouth daily. ? indomethacin 2 cap 3 times a day for 3 days then 1 cap three times a day ? pantoprazole Take 1 tablet (40 mg total) by mouth daily. (Patient not taking: Reported on 06/10/2019) ?	Review of Systems:Review of Systems Constitutional: Negative.  HENT: Negative.  Respiratory: Negative.  Cardiovascular: Negative.  Genitourinary: Negative.  Musculoskeletal: Positive for arthralgias and joint swelling. Skin: Negative.  Neurological: Negative.  Hematological: Negative.    Objective: ?	BP 129/83 (Site: r a, Position: Sitting, Cuff Size: Large)  - Pulse (!) 104  - Temp 97.7 ?F (36.5 ?C) (No-touch scanner)  - Resp 20  - Ht 6' (1.829 m)  - Wt 88.7 kg  - SpO2 100%  - BMI 26.53 kg/m?  ?	Pain Score:   9Physical Exam Constitutional: He is oriented to person, place, and time. Cardiovascular: Normal rate, regular rhythm and normal heart sounds. Exam reveals no gallop and no friction rub. No murmur heard.Pulmonary/Chest: Effort normal and breath sounds normal. Musculoskeletal:       General: Tenderness present.      Feet:Neurological: He is alert and oriented to person, place, and time. ?	 Assessment / Plan: The patient is a 64 y.o. male with a PMH of Gout, 2nd degree AV block who presents to the clinic for the following medical issues.1. Gout flare upPatient was seen via tele health 1 week ago, started on allopurinol 100mg . Patient had a gout flare up that started on Thursday but he ran out of indomethacin over the weekend. On PE, patient was having warmth and swelling over his right big toe and left medial ankle.Plan:- Refilled prescription of indomethacin for flare up and future prophylaxis while on allopurinol - Continue to take allopurinol 100mg .- Uric acid blood level check in 10 days to see effect of allopurinol (goal <6).Patient Education:?	See patient instructionsFollow-Up:  Follow-Up in 2/3 weeks via tele health  Electronically Signed by Evalee Jefferson, STUDENT, Jun 16, 2019

## 2019-07-13 NOTE — ED Provider Notes
HistoryChief Complaint Patient presents with ? Knee Pain   Left knee pain and swelling since Thurs. hx gout ? Joint Swelling  Patient is a 64 year old male comes in complaining of gout flare-up in his left knee.  Patient has long history of gout and his last uric acid was elevated.  Patient states he was at the clinic and they switched his medication from indomethacin to allopurinol and he states it got worse.  He denies any fevers however has significant pain and swelling in believes there is fluid in the knee. Past Medical History: Diagnosis Date ? 2nd degree AV block  ? Gout  Past Surgical History: Procedure Laterality Date ? PACEMAKER INSERTION   No family history on file.Social History Socioeconomic History ? Marital status: Single   Spouse name: Not on file ? Number of children: Not on file ? Years of education: Not on file ? Highest education level: Not on file Tobacco Use ? Smoking status: Current Every Day Smoker   Packs/day: 1.00   Years: 50.00   Pack years: 50.00   Types: Cigarettes ? Smokeless tobacco: Never Used Substance and Sexual Activity ? Alcohol use: Yes   Alcohol/week: 42.0 standard drinks   Types: 42 Cans of beer per week   Frequency: Monthly or less   Comment: drinks a 6 pack of beer daily ? Drug use: Not Currently ED Other Social History E-cigarette/Vaping Substances E-cigarette/Vaping Devices Review of Systems Constitutional: Negative for fever. Cardiovascular: Negative.  Gastrointestinal: Negative.  Musculoskeletal: Positive for arthralgias and joint swelling. Neurological: Negative.   Physical ExamED Triage Vitals [07/05/19 0837]BP: (!) 186/75Pulse: (!) 96Pulse from  O2 sat: n/aResp: 20Temp: 98.1 ?F (36.7 ?C)Temp src: OralSpO2: 99 % BP (!) 186/75  - Pulse (!) 96  - Temp 98.1 ?F (36.7 ?C) (Oral)  - Resp 20  - Wt 82 kg (180 lb 12.4 oz)  - SpO2 99%  - BMI 24.52 kg/m? Physical ExamVitals signs reviewed. Cardiovascular:    Rate and Rhythm: Normal rate and regular rhythm.    Heart sounds: Murmur present.    Comments: pacemakerPulmonary:    Effort: Pulmonary effort is normal.    Breath sounds: Normal breath sounds. Abdominal:    Palpations: Abdomen is soft. Musculoskeletal:    Left knee: He exhibits decreased range of motion, swelling and effusion.      Legs:Neurological:    Mental Status: He is alert.  ProceduresArthrocentesisPerformed by: Orinda Kenner, PAAuthorized by: Orinda Kenner, PA Pre-procedure Time OutIndications: joint swelling, pain and diagnostic evaluation Body area: kneeJoint: left kneeLocal anesthesia used: yesAnesthesia:Local anesthesia used: yesLocal Anesthetic: lidocaine 1% without epinephrineAnesthetic total: 8 mLSedation:Patient sedated: noPreparation: Patient was prepped and draped in the usual sterile fashion.Needle size: 18 GApproach: lateralAspirate: yellow and blood-tingedAspirate amount: 150 mLPatient tolerance: patient tolerated the procedure well with no immediate complications ED COURSEPatient Reevaluation: Will check x-ray and likely tap knee.Joint fluid sent to the lab for diagnostic evaluation.  Dry sterile dressing was placed and patient discharged home.  He should follow up with Orthopedics.Clinical Impressions as of Jul 12 900 Gout of left knee, unspecified cause, unspecified chronicity Knee effusion, left  ED DispositionDischarge Orinda Kenner, PA06/05/21 1610 Orinda Kenner, PA06/13/21 [phone]

## 2019-07-17 NOTE — Progress Notes
Internal Medicine Primary Care Clinic NoteVIDEO TELEHEALTH VISIT: This clinician is part of the telehealth program and is conducting this visit in a currently approved location. For this visit the clinician and patient were present via interactive audio & video telecommunications system that permits real-time communications.Patient consent given for video visit: YesState patient is located in: CTThe clinician is appropriately licensed in the above state to provide care for this visit.Other individuals present during the telehealth encounter and their role/relation: noneTotal time spent by the provider on the day of service, which includes time spent on chart review, medical video consultation, education, coordination of care/services and counseling: 20 minutesBecause this visit was completed over video, a hands-on physical exam was not performed.  Patient/parent or guardian understands and knows to call back if condition changes.The visit type for this patient required modifications due to the COVID-19 outbreak. Reason for  telephone/video visit: initial visit ZOX:WRUEAVW [name]  is a 64 y.o. 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 Gout- Patient admitted 4/21-4/23 for gout flare of right knee. R knee synovial fluid analysis showed WBC 21,175 with monosodium urate crystals - Reports having a few gout attacks per year and seems never been started on urate lowering agents- He was discharged on ibuprofen 800mg  TID and PPI- endorses mild right knee pain. Has been taking ibuprofen 800mg  BID, but was not taking PPI2nd degree AV block (2:1) / RBBB- While admitted he was found to be bradycardic with HR 40-50s and diaphoretic. No other symptoms. 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 Discharged on keflex ppx for 3 days - Lyme antibodies negative- PPM interrogated 4/27 with one episode NSVT, 26 beats. Has cardiology appointment 6/11- today has no complaints, no chest pain, dizziness or palpitations. No syncopal episodes.EtOH/Smoking- current smoker, for the past 50 years. Has recently cut down to 10 cigarettes per day, down from 1 PPD- drinks a 6 pack of beer daily, denies h/o withdrawal- denies drug use, no marijuana- he sometimes uses nicotine lozenges. Not interested in chantix. He has a goal to quit smoking by the end of summer- he is interested in getting a marijuana cardHe recently moved to [place] from [place] 10/2018. He works as a Biochemist, clinical.CURRENT PROBLEM LIST:Patient Active Problem List Diagnosis ? Acute pain of right knee ? Heart block AV second degree CURRENT MEDICATIONS:Current Medications Medication Sig ? ibuprofen (ADVIL,MOTRIN) 600 mg tablet Take 1 tablet (600 mg total) by mouth every 6 (six) hours as needed. REVIEW OF SYSTEMS: 13 point review of systems done.  Significant only for the items mentioned in the HPI.Pertinent labs and imagings:  LABS:Complete Blood Count:Lab Results Component Value Date  WBC 5.3 05/23/2019  RBC 4.2 (L) 05/23/2019  HGB 14.1 05/23/2019  HCT 40.9 05/23/2019  MCV 97.6 (H) 05/23/2019  MCH 33.7 05/23/2019  MCHC 34.5 05/23/2019  PLT 199 05/23/2019  MPV 10.8 05/23/2019 Comprehensive Metabolic Panel:Lab Results Component Value Date  GLU 92 05/23/2019  BUN 17 05/23/2019  CREATININE 1.14 05/23/2019  NA 139 05/23/2019  K 3.9 05/23/2019  CL 104 05/23/2019  CO2 25 05/23/2019  ALBUMIN 3.5 (L) 05/23/2019  PROT 7.0 05/23/2019  BILITOT 0.5 05/23/2019  ALKPHOS 85 05/23/2019  ALT 12 05/23/2019  GLOB 3.5 05/23/2019  CALCIUM 9.4 05/23/2019 Alanine Aminotransferase (ALT) Date Value Ref Range Status 05/23/2019 12 9 - 59 U/L Final   Comment: Calcium dobesilate can cause artificially low  ALT results at therapeutic concentrations Aspartate Aminotransferase (AST) Date Value Ref Range Status 05/23/2019 21 10 - 35 U/L Final  Assessment and Plan:Chronic gout of right knee- uric acid level during admission 7.8- continue ibuprofen as needed- advised to take pantoprazole when taking nsaids- start allopurinol 100 mg daily- check uric acid level at next visit, and titrate up if needed to goal < 6Heart block AV second degree- currently asymptomatic- advised to not miss his appointment with cardiologyHealthcare maintenance- HbA1c 5.6 on 05/2019- per patient had colonoscopy in [place] 8 years ago which was normal- completed the covid vaccine series last month- declined HIV and hepatitis C screening- discuss shingles and pneumonia vaccine at next visit- will mail patient information on criteria for a marijuana cardFollow up: 3 months in personCOVID-19 Overview with patient:Covid-19 warning signs were asked and patient had not experinced any of the following s/s;Fever (100.4 or higher ? 2 consecutive scans)  Sore throatHoarse voiceNew headache Persistent Cough Shortness of Breath Extreme fatigue Body Aches Acute Loss of taste and smellDiscussed Personal Protective Measures?	Avoid contact with sick people?	Wash your hands often with soap and water, use alcohol-based hand cleaner if no soap and water is available.?	To the extent possible, avoid touching high-touch surfaces in public places ? door handles, handrails, handshaking with people. ?	Use a tissue or your sleeve to cover any coughs.?	Avoid touching your face, nose or eyes.?	Clean and disinfect your home to remove germs. Especially frequently touched surfaces ? tables, doorknobs, light switches, toilets, faucets, phones.?	Avoid crowds, especially in poorly ventilated spaces.?	Stay home as much as possible. ?	Avoid non-essential travel. ?	Maintain a distance of 6 ft. from others.?	If you do need to leave home wear a mask or other covering.Reviewed covid symptoms and precautions to minimize exposureI gave them the [facility] hotline number if they have questions: 833-ASK-YNHH [phone]) I have reminded them of our office number should they need a more urgent appointment. Case discussed with Dr. Thresa Ross. Attending addendum to follow.Signed UJ:[id number] Lucius Conn, MDInternal Medicine, PGY-24:00 AM5/12/2019

## 2019-07-19 LAB — BODY FLUID CULTURE     (BH GH L LMW YH)
BKR BODY FLUID CULTURE: NO GROWTH
BKR GRAM STAIN (ABNORMAL): NONE SEEN

## 2019-08-30 ENCOUNTER — Telehealth: Admit: 2019-08-30 | Payer: PRIVATE HEALTH INSURANCE | Attending: Cardiovascular Disease | Primary: Internal Medicine

## 2019-08-30 NOTE — Telephone Encounter
He called the service Saturday 08/30/19.The pacer was placed 4/22/21He said he sees 3 red dots in a line along the bump of the pacer for a day or 2. Not on incision line.He said looks like mosquito bites.They do itch a bit. No discharge or redness to the area but dots are red. No hx of this. Incision site is intact. No discharge.No fevers or other symptomsI advised come to the ED today if he is concerned but she said it is fine overall and wishes to be seen Monday.He knows to come to the ED if there is any changes or signs of infection.Can you bring him in Monday to look at the pacer site?-Greg

## 2019-09-01 MED ORDER — CEPHALEXIN 500 MG CAPSULE
500 | ORAL_CAPSULE | Freq: Three times a day (TID) | ORAL | 1 refills | 7.00000 days | Status: AC
Start: 2019-09-01 — End: 2020-01-19

## 2019-09-01 NOTE — Telephone Encounter
He can come in today at 1130 or anytime tomorrow there is a opening.

## 2019-09-01 NOTE — Telephone Encounter
Patient has an appt to be seen today 

## 2019-10-03 ENCOUNTER — Telehealth
Admit: 2019-10-03 | Payer: PRIVATE HEALTH INSURANCE | Attending: Student in an Organized Health Care Education/Training Program | Primary: Internal Medicine

## 2019-10-03 NOTE — Telephone Encounter
Contacted patient and schedueld an appointment from the wait list for 10/14

## 2019-11-13 ENCOUNTER — Encounter
Admit: 2019-11-13 | Payer: PRIVATE HEALTH INSURANCE | Attending: Student in an Organized Health Care Education/Training Program | Primary: Internal Medicine

## 2019-11-13 ENCOUNTER — Ambulatory Visit
Admit: 2019-11-13 | Payer: PRIVATE HEALTH INSURANCE | Attending: Student in an Organized Health Care Education/Training Program | Primary: Internal Medicine

## 2019-11-13 ENCOUNTER — Encounter: Admit: 2019-11-13 | Payer: PRIVATE HEALTH INSURANCE | Primary: Internal Medicine

## 2019-11-13 DIAGNOSIS — I441 Atrioventricular block, second degree: Secondary | ICD-10-CM

## 2019-11-13 DIAGNOSIS — Z23 Encounter for immunization: Secondary | ICD-10-CM

## 2019-11-13 DIAGNOSIS — M109 Gout, unspecified: Secondary | ICD-10-CM

## 2019-11-13 DIAGNOSIS — K4090 Unilateral inguinal hernia, without obstruction or gangrene, not specified as recurrent: Secondary | ICD-10-CM

## 2019-12-11 NOTE — Progress Notes
Patient left visit before he was seen. Spoke with him on the phone to verify he is still following with Cardiology, which he mentioned he does. He mentions having a left groin hernia which he would like fixed. Reports the hernia reduces completely while lying down. Surgery referral sent. Will reschedule his appointment with our primary clinic.Signed IH:[id number] Lucius Conn, MDInternal Medicine, PGY-310:20 PM10/17/2021

## 2019-12-22 ENCOUNTER — Ambulatory Visit: Admit: 2019-12-22 | Payer: PRIVATE HEALTH INSURANCE | Attending: Surgical Critical Care | Primary: Internal Medicine

## 2019-12-22 ENCOUNTER — Encounter: Admit: 2019-12-22 | Payer: PRIVATE HEALTH INSURANCE | Primary: Internal Medicine

## 2019-12-22 DIAGNOSIS — M109 Gout, unspecified: Secondary | ICD-10-CM

## 2019-12-22 DIAGNOSIS — I441 Atrioventricular block, second degree: Secondary | ICD-10-CM

## 2019-12-22 DIAGNOSIS — K4090 Unilateral inguinal hernia, without obstruction or gangrene, not specified as recurrent: Secondary | ICD-10-CM

## 2019-12-23 NOTE — Progress Notes
[facility] Surgery Clinic General SURGERY Clinic HISTORY AND PHYSICALPresentation History: Source of Information: patientChief Complaint:left  Inguinal herniaHPI:Mr. Brett [name] is a 64 y.o M with PMH significant for 2nd degree AV block and PSH right inguinal hernia many years ago who has presented to the clinic for evaluation of left inguinal hernia. He notes having bulging and left inguinal hernia from about 1 year ago which is reducible and improves with lying flat. It does not cause much pain and he did not have any ED presentation for hernia incarceration/strangulation. He also notes having some bulging over the left side of umbilicus which has been for 7-8 years and wants it to be removed. Patient has not noted any change in his bowel habits , denies any trouble eating , nausea, vomiting. 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 Health Issues: - Pacemaker due to AV block- left inguinal hernia, supraumbilical hernia/Lipoma- back pain radiating to both extremitiesMedical History: PMH PSH Past Medical History: Diagnosis Date ? 2nd degree AV block  ? Gout   Past Surgical History: Procedure Laterality Date ? PACEMAKER INSERTION    Family/Social History:  - tobacco: smoker- EtOH: - Recreational drugs: - Occupation: - Living environment:    Current Medication: Prior to Admission Medications (Not in a hospital admission) Allergies No Known Allergies Physical Exam:  PHYSICAL:GEN: no acute distress, very pleasantNEURO: non focalPULM: unlabored breathing on room airCVS: S1S2, RRR currentlyBACK: large lipoma over the left buttockABD: soft, Not tender, non distended, reducible left inguinal hernia without skin changes, supraumbilical hernia/lipoma over the left side of the umbilicusEXT: warm, well-perfused, non-tender, no edemaLabs: Labs:Lab Results Component Value Date/Time  WBC 5.3 05/23/2019 09:04 AM  HGB 14.1 05/23/2019 09:04 AM  HCT 40.9 05/23/2019 09:04 AM  PLT 199 05/23/2019 09:04 AM  INR 1.00 05/21/2019 05:26 PM  PTT 30.3 05/21/2019 05:26 PM  NA 139 05/23/2019 09:04 AM  K 3.9 05/23/2019 09:04 AM  CL 104 05/23/2019 09:04 AM  CO2 25 05/23/2019 09:04 AM  BUN 17 05/23/2019 09:04 AM  CREATININE 1.14 05/23/2019 09:04 AM  GLU 92 05/23/2019 09:04 AM  CALCIUM 9.4 05/23/2019 09:04 AM  MG 2.0 05/22/2019 05:34 AM Diagnostics:No recent imaging availableImpressions: Patient is a 64 y.o male with left inguinal hernia and ventral abdominal lesion concerning for supraumbilical hernia/ lipoma . Patient is willing to have surgery for fixing the inguinal hernia.- consented and booked for left inguinal hernia repair - [place] scan Abd/pelvis with oral contrast to evaluate inguinal/supraumbilical hernia- Cardiac clearance for surgerySigned:Amir [name], MDGeneral Surgery, PGY-111/22/20217:28 PMAttending Statement: Patient seen and examined with resident.  Resident note reviewed.Impression:1. LIH2. Supraumbilical mass, poss hernia3. S/p PPMPlan:1. [place] abd/pelvis to evaluate for supraumbilical hernia2. Schedule for L inguinal herniorrhaphy with mesh3. Cardiology clearanceWalter Juanelle [name], MDNovember 22, 20218:54 PM

## 2019-12-23 NOTE — H&P
[facility] General SURGERY Clinic HISTORY AND PHYSICALPresentation History: Source of Information: patientChief Complaint:left  Inguinal herniaHPI:Brett [name] is a 64 y.o M with PMH significant for 2nd degree AV block and PSH right inguinal hernia many years ago who has presented to the clinic for evaluation of left inguinal hernia. He notes having bulging and left inguinal hernia from about 1 year ago which is reducible and improves with lying flat. It does not cause much pain and he did not have any ED presentation for hernia incarceration/strangulation. He also notes having some bulging over the left side of umbilicus which has been for 7-8 years and wants it to be removed. Patient has not noted any change in his bowel habits , denies any trouble eating , nausea, vomiting. 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 Health Issues: - Pacemaker due to AV block- left inguinal hernia, supraumbilical hernia/Lipoma- back pain radiating to both extremitiesMedical History: PMH PSH Past Medical History: Diagnosis Date ? 2nd degree AV block  ? Gout   Past Surgical History: Procedure Laterality Date ? PACEMAKER INSERTION    Family/Social History:  - tobacco: smoker- EtOH: - Recreational drugs: - Occupation: - Living environment:    Current Medication: Prior to Admission Medications (Not in a hospital admission) Allergies No Known Allergies Physical Exam:  PHYSICAL:GEN: no acute distress, very pleasantNEURO: non focalPULM: unlabored breathing on room airCVS: S1S2, RRR currentlyBACK: large lipoma over the left buttockABD: soft, Not tender, non distended, reducible left inguinal hernia without skin changes, supraumbilical hernia/lipoma over the left side of the umbilicusEXT: warm, well-perfused, non-tender, no edemaLabs: Labs:Lab Results Component Value Date/Time  WBC 5.3 05/23/2019 09:04 AM  HGB 14.1 05/23/2019 09:04 AM  HCT 40.9 05/23/2019 09:04 AM  PLT 199 05/23/2019 09:04 AM  INR 1.00 05/21/2019 05:26 PM  PTT 30.3 05/21/2019 05:26 PM  NA 139 05/23/2019 09:04 AM  K 3.9 05/23/2019 09:04 AM  CL 104 05/23/2019 09:04 AM  CO2 25 05/23/2019 09:04 AM  BUN 17 05/23/2019 09:04 AM  CREATININE 1.14 05/23/2019 09:04 AM  GLU 92 05/23/2019 09:04 AM  CALCIUM 9.4 05/23/2019 09:04 AM  MG 2.0 05/22/2019 05:34 AM Diagnostics:No recent imaging availableImpressions: Patient is a 64 y.o male with left inguinal hernia and ventral abdominal lesion concerning for supraumbilical hernia/ lipoma . Patient is willing to have surgery for fixing the inguinal hernia.- consented and booked for left inguinal hernia repair - [name] with oral contrast to evaluate inguinal/supraumbilical hernia- Cardiac clearance for surgerySigned:Amir Manafi, MDGeneral Surgery, PGY-111/22/20217:28 PMAttending Statement: Patient seen and examined with resident.  Resident note reviewed.Impression:1. LIH2. Supraumbilical mass, poss hernia3. S/p PPMPlan:1. [place] abd/pelvis to evaluate for supraumbilical hernia2. Schedule for L inguinal herniorrhaphy with mesh3. Cardiology clearanceWalter Mardee Clune, MDNovember 22, 20218:54 PM

## 2020-01-09 ENCOUNTER — Inpatient Hospital Stay: Admit: 2020-01-09 | Discharge: 2020-01-09 | Payer: PRIVATE HEALTH INSURANCE | Primary: Internal Medicine

## 2020-01-09 DIAGNOSIS — K4090 Unilateral inguinal hernia, without obstruction or gangrene, not specified as recurrent: Secondary | ICD-10-CM

## 2020-01-09 MED ORDER — IOHEXOL (OMNIPAQUE) 350 MG/ML ORAL SOLUTION 25 ML IN WATER 900 ML
Freq: Once | ORAL | Status: CP
Start: 2020-01-09 — End: ?
  Administered 2020-01-09: 17:00:00 900.000 mL via ORAL

## 2020-01-15 ENCOUNTER — Encounter: Admit: 2020-01-15 | Payer: PRIVATE HEALTH INSURANCE | Attending: Surgical Critical Care | Primary: Internal Medicine

## 2020-01-19 ENCOUNTER — Encounter
Admit: 2020-01-19 | Payer: PRIVATE HEALTH INSURANCE | Attending: Student in an Organized Health Care Education/Training Program | Primary: Internal Medicine

## 2020-01-19 ENCOUNTER — Ambulatory Visit
Admit: 2020-01-19 | Payer: PRIVATE HEALTH INSURANCE | Attending: Student in an Organized Health Care Education/Training Program | Primary: Internal Medicine

## 2020-01-19 ENCOUNTER — Telehealth: Admit: 2020-01-19 | Payer: PRIVATE HEALTH INSURANCE | Attending: Internal Medicine | Primary: Internal Medicine

## 2020-01-19 ENCOUNTER — Inpatient Hospital Stay: Admit: 2020-01-19 | Discharge: 2020-01-19 | Payer: PRIVATE HEALTH INSURANCE | Primary: Internal Medicine

## 2020-01-19 DIAGNOSIS — M25561 Pain in right knee: Secondary | ICD-10-CM

## 2020-01-19 DIAGNOSIS — M109 Gout, unspecified: Secondary | ICD-10-CM

## 2020-01-19 DIAGNOSIS — I10 Essential (primary) hypertension: Secondary | ICD-10-CM

## 2020-01-19 DIAGNOSIS — G8929 Other chronic pain: Secondary | ICD-10-CM

## 2020-01-19 DIAGNOSIS — Z0000 Encounter for general adult medical examination without abnormal findings: Secondary | ICD-10-CM

## 2020-01-19 DIAGNOSIS — I441 Atrioventricular block, second degree: Secondary | ICD-10-CM

## 2020-01-19 DIAGNOSIS — K219 Gastro-esophageal reflux disease without esophagitis: Secondary | ICD-10-CM

## 2020-01-19 LAB — COMPREHENSIVE METABOLIC PANEL
BKR A/G RATIO: 1.5 (ref 1.0–2.2)
BKR ALANINE AMINOTRANSFERASE (ALT): 23 U/L (ref 9–59)
BKR ALBUMIN: 4.7 g/dL (ref 3.6–4.9)
BKR ALKALINE PHOSPHATASE: 109 U/L (ref 9–122)
BKR ANION GAP: 9 (ref 7–17)
BKR ASPARTATE AMINOTRANSFERASE (AST): 30 U/L (ref 10–35)
BKR AST/ALT RATIO: 1.3
BKR BILIRUBIN TOTAL: 0.4 mg/dL (ref <=1.2)
BKR BLOOD UREA NITROGEN: 13 mg/dL (ref 8–23)
BKR BUN / CREAT RATIO: 11.5 (ref 8.0–23.0)
BKR CALCIUM: 9.3 mg/dL (ref 8.8–10.2)
BKR CHLORIDE: 102 mmol/L (ref 98–107)
BKR CO2: 28 mmol/L (ref 20–30)
BKR CREATININE: 1.13 mg/dL (ref 0.40–1.30)
BKR EGFR (AFR AMER): >60 mL/min/{1.73_m2} (ref >60)
BKR EGFR (NON AFRICAN AMERICAN): >60 mL/min/{1.73_m2} (ref >60)
BKR GLOBULIN: 3.2 g/dL
BKR GLUCOSE: 99 mg/dL (ref 70–100)
BKR POTASSIUM: 3.8 mmol/L (ref 3.3–5.3)
BKR PROTEIN TOTAL: 7.9 g/dL (ref 6.6–8.7)
BKR SARS-COV-2 RNA (COVID-19) (YH): 11.5 (ref 8.0–23.0)
BKR SODIUM: 139 mmol/L (ref 136–144)

## 2020-01-19 LAB — URIC ACID: BKR URIC ACID: 10.1 mg/dL — ABNORMAL HIGH (ref 2.7–7.3)

## 2020-01-19 LAB — HEMOGLOBIN A1C
BKR ESTIMATED AVERAGE GLUCOSE: 111 mg/dL
BKR HEMOGLOBIN A1C: 5.5 % (ref 4.0–5.6)

## 2020-01-19 MED ORDER — PANTOPRAZOLE 40 MG TABLET,DELAYED RELEASE
40 mg | ORAL_TABLET | Freq: Every day | ORAL | 1 refills | Status: AC
Start: 2020-01-19 — End: 2020-04-19

## 2020-01-19 MED ORDER — COLCHICINE 0.6 MG TABLET
0.6 mg | ORAL_TABLET | Freq: Every day | ORAL | 1 refills | Status: AC
Start: 2020-01-19 — End: 2020-03-17

## 2020-01-19 MED ORDER — BLOOD PRESSURE CUFF
1 refills | Status: AC
Start: 2020-01-19 — End: ?

## 2020-01-19 NOTE — Patient Instructions
- blood work today- schedule X-ray of your knee- referral sent to Physical Therapy- a colon cancer screening kit will be mailed to your house- take your blood pressure at home and write down the readings. Bring the log to your next visit- referral to Rheumatology- DO NOT take Colchicine less than 2-3 days aparthttps://www.nhlbi.nih.gov/files/docs/public/heart/dash_brief.pdf> DASH Eating PlanDASH stands for Dietary Approaches to Stop Hypertension. The DASH eating plan is a healthy eating plan that has been shown to:?	Reduce high blood pressure (hypertension).?	Reduce your risk for type 2 diabetes, heart disease, and stroke.?	Help with weight loss.What are tips for following this plan?Reading food labels?	Check food labels for the amount of salt (sodium) per serving. Choose foods with less than 5 percent of the Daily Value of sodium. Generally, foods with less than 300 milligrams (mg) of sodium per serving fit into this eating plan.?	To find whole grains, look for the word whole as the first word in the ingredient list.Shopping?	Buy products labeled as low-sodium or no salt added.?	Buy fresh foods. Avoid canned foods and pre-made or frozen meals.Cooking?	Avoid adding salt when cooking. Use salt-free seasonings or herbs instead of table salt or sea salt. Check with your health care provider or pharmacist before using salt substitutes.?	Do not fry foods. Cook foods using healthy methods such as baking, boiling, grilling, roasting, and broiling instead.?	Cook with heart-healthy oils, such as olive, canola, avocado, soybean, or sunflower oil.Meal planning?	Eat a balanced diet that includes:?	4 or more servings of fruits and 4 or more servings of vegetables each day. Try to fill one-half of your plate with fruits and vegetables.?	6-8 servings of whole grains each day.?	Less than 6 oz (170 g) of lean meat, poultry, or fish each day. A 3-oz (85-g) serving of meat is about the same size as a deck of cards. One egg equals 1 oz (28 g).?	2-3 servings of low-fat dairy each day. One serving is 1 cup (237 mL).?	1 serving of nuts, seeds, or beans 5 times each week.?	2-3 servings of heart-healthy fats. Healthy fats called omega-3 fatty acids are found in foods such as walnuts, flaxseeds, fortified milks, and eggs. These fats are also found in cold-water fish, such as sardines, salmon, and mackerel.?	Limit how much you eat of:?	Canned or prepackaged foods.?	Food that is high in trans fat, such as some fried foods.?	Food that is high in saturated fat, such as fatty meat.?	Desserts and other sweets, sugary drinks, and other foods with added sugar.?	Full-fat dairy products.?	Do not salt foods before eating.?	Do not eat more than 4 egg yolks a week.?	Try to eat at least 2 vegetarian meals a week.?	Eat more home-cooked food and less restaurant, buffet, and fast food.  Lifestyle?	When eating at a restaurant, ask that your food be prepared with less salt or no salt, if possible.?	If you drink alcohol:?	Limit how much you use to:?	0-1 drink a day for women who are not pregnant.?	0-2 drinks a day for men.?	Be aware of how much alcohol is in your drink. In the [place] one drink equals one 12 oz bottle of beer (355 mL), one 5 oz glass of wine (148 mL), or one 1? oz glass of hard liquor (44 mL).General information?	Avoid eating more than 2,300 mg of salt a day. If you have hypertension, you may need to reduce your sodium intake to 1,500 mg a day.?	Work with your health care provider to maintain a healthy body weight or to lose weight. Ask what an ideal weight is for you.?	Get at least 30 minutes of exercise that causes your heart to beat faster (aerobic exercise) most days of the week. Activities may include walking, swimming,  or biking.?	Work with your health care provider or dietitian to adjust your eating plan to your individual calorie needs.What foods should I eat?FruitsAll fresh, dried, or frozen fruit. Canned fruit in natural juice (without added sugar).VegetablesFresh or frozen vegetables (raw, steamed, roasted, or grilled). Low-sodium or reduced-sodium tomato and vegetable juice. Low-sodium or reduced-sodium tomato sauce and tomato paste. Low-sodium or reduced-sodium canned vegetables.GrainsWhole-grain or whole-wheat bread. Whole-grain or whole-wheat pasta. Brown rice. Orpah Cobb. Bulgur. Whole-grain and low-sodium cereals. Pita bread. Low-fat, low-sodium crackers. Whole-wheat flour tortillas.Meats and other proteinsSkinless chicken or [place]. Ground chicken or [place]. Pork with fat trimmed off. Fish and seafood. Egg whites. Dried beans, peas, or lentils. Unsalted nuts, nut butters, and seeds. Unsalted canned beans. Lean cuts of beef with fat trimmed off. Low-sodium, lean precooked or cured meat, such as sausages or meat loaves.DairyLow-fat (1%) or fat-free (skim) milk. Reduced-fat, low-fat, or fat-free cheeses. Nonfat, low-sodium ricotta or cottage cheese. Low-fat or nonfat yogurt. Low-fat, low-sodium cheese.Fats and oilsSoft margarine without trans fats. Vegetable oil. Reduced-fat, low-fat, or light mayonnaise and salad dressings (reduced-sodium). Canola, safflower, olive, avocado, soybean, and sunflower oils. Avocado.Seasonings and condimentsHerbs. Spices. Seasoning mixes without salt.Other foodsUnsalted popcorn and pretzels. Fat-free sweets.The items listed above may not be a complete list of foods and beverages you can eat. Contact a dietitian for more information.What foods should I avoid?FruitsCanned fruit in a light or heavy syrup. Fried fruit. Fruit in cream or butter sauce.VegetablesCreamed or fried vegetables. Vegetables in a cheese sauce. Regular canned vegetables (not low-sodium or reduced-sodium). Regular canned tomato sauce and paste (not low-sodium or reduced-sodium). Regular tomato and vegetable juice (not low-sodium or reduced-sodium). Rosita Fire. Olives.GrainsBaked goods made with fat, such as croissants, muffins, or some breads. Dry pasta or rice meal packs.Meats and other proteinsFatty cuts of meat. Ribs. Fried meat. Tomasa Blase. Bologna, salami, and other precooked or cured meats, such as sausages or meat loaves. Fat from the back of a pig (fatback). Bratwurst. Salted nuts and seeds. Canned beans with added salt. Canned or smoked fish. Whole eggs or egg yolks. Chicken or [place] with skin.DairyWhole or 2% milk, cream, and half-and-half. Whole or full-fat cream cheese. Whole-fat or sweetened yogurt. Full-fat cheese. Nondairy creamers. Whipped toppings. Processed cheese and cheese spreads.Fats and oilsButter. Stick margarine. Lard. Shortening. Ghee. Bacon fat. Tropical oils, such as coconut, palm kernel, or palm oil.Seasonings and condimentsOnion salt, garlic salt, seasoned salt, table salt, and sea salt. Worcestershire sauce. Tartar sauce. Barbecue sauce. Teriyaki sauce. Soy sauce, including reduced-sodium. Steak sauce. Canned and packaged gravies. Fish sauce. Oyster sauce. Cocktail sauce. Store-bought horseradish. Ketchup. Mustard. Meat flavorings and tenderizers. Bouillon cubes. Hot sauces. Pre-made or packaged marinades. Pre-made or packaged taco seasonings. Relishes. Regular salad dressings.Other foodsSalted popcorn and pretzels.The items listed above may not be a complete list of foods and beverages you should avoid. Contact a dietitian for more information.Where to find more information?	National Heart, Lung, and Blood Institute: PopSteam.is?	American Heart Association: www.heart.org?	Academy of Nutrition and Dietetics: www.eatright.org?	National Kidney Foundation: www.kidney.orgSummary?	The DASH eating plan is a healthy eating plan that has been shown to reduce high blood pressure (hypertension). It may also reduce your risk for type 2 diabetes, heart disease, and stroke.?	When on the DASH eating plan, aim to eat more fresh fruits and vegetables, whole grains, lean proteins, low-fat dairy, and heart-healthy fats.?	With the DASH eating plan, you should limit salt (sodium) intake to 2,300 mg a day. If you have hypertension, you may need to reduce your sodium intake to 1,500 mg a day.?	Work with your health care provider or dietitian  to adjust your eating plan to your individual calorie needs.This information is not intended to replace advice given to you by your health care provider. Make sure you discuss any questions you have with your health care provider.Document Revised: 12/20/2018 Document Reviewed: 11/20/2020Elsevier Patient Education ? 2021 Elsevier Inc.Low-Sodium Eating PlanSodium, which is an element that makes up salt, helps you maintain a healthy balance of fluids in your body. Too much sodium can increase your blood pressure and cause fluid and waste to be held in your body.Your health care provider or dietitian may recommend following this plan if you have high blood pressure (hypertension), kidney disease, liver disease, or heart failure. Eating less sodium can help lower your blood pressure, reduce swelling, and protect your heart, liver, and kidneys.What are tips for following this plan?Reading food labels?	The Nutrition Facts label lists the amount of sodium in one serving of the food. If you eat more than one serving, you must multiply the listed amount of sodium by the number of servings.?	Choose foods with less than 140 mg of sodium per serving.?	Avoid foods with 300 mg of sodium or more per serving.Shopping?	Look for lower-sodium products, often labeled as low-sodium or no salt added.?	Always check the sodium content, even if foods are labeled as unsalted or no salt added.?	Buy fresh foods.?	Avoid canned foods and pre-made or frozen meals.?	Avoid canned, cured, or processed meats.?	Buy breads that have less than 80 mg of sodium per slice.  Cooking?	Eat more home-cooked food and less restaurant, buffet, and fast food.?	Avoid adding salt when cooking. Use salt-free seasonings or herbs instead of table salt or sea salt. Check with your health care provider or pharmacist before using salt substitutes.?	Cook with plant-based oils, such as canola, sunflower, or olive oil.  Meal planning?	When eating at a restaurant, ask that your food be prepared with less salt or no salt, if possible. Avoid dishes labeled as brined, pickled, cured, smoked, or made with soy sauce, miso, or teriyaki sauce.?	Avoid foods that contain MSG (monosodium glutamate). MSG is sometimes added to [place] food, bouillon, and some canned foods.?	Make meals that can be grilled, baked, poached, roasted, or steamed. These are generally made with less sodium.General informationMost people on this plan should limit their sodium intake to 1,500-2,000 mg (milligrams) of sodium each day.What foods should I eat?FruitsFresh, frozen, or canned fruit. Fruit juice.VegetablesFresh or frozen vegetables. No salt added canned vegetables. No salt added tomato sauce and paste. Low-sodium or reduced-sodium tomato and vegetable juice.GrainsLow-sodium cereals, including oats, puffed wheat and rice, and shredded wheat. Low-sodium crackers. Unsalted rice. Unsalted pasta. Low-sodium bread. Whole-grain breads and whole-grain pasta.Meats and other proteinsFresh or frozen (no salt added) meat, poultry, seafood, and fish. Low-sodium canned tuna and salmon. Unsalted nuts. Dried peas, beans, and lentils without added salt. Unsalted canned beans. Eggs. Unsalted nut butters.DairyMilk. Soy milk. Cheese that is naturally low in sodium, such as ricotta cheese, fresh mozzarella, or Swiss cheese. Low-sodium or reduced-sodium cheese. Cream cheese. Yogurt.Seasonings and condimentsFresh and dried herbs and spices. Salt-free seasonings. Low-sodium mustard and ketchup. Sodium-free salad dressing. Sodium-free light mayonnaise. Fresh or refrigerated horseradish. Lemon juice. Vinegar.Other foodsHomemade, reduced-sodium, or low-sodium soups. Unsalted popcorn and pretzels. Low-salt or salt-free chips.The items listed above may not be a complete list of foods and beverages you can eat. Contact a dietitian for more information.What foods should I avoid?VegetablesSauerkraut, pickled vegetables, and relishes. Olives. [place] fries. Onion rings. Regular canned vegetables (not low-sodium or reduced-sodium). Regular canned tomato sauce and paste (not low-sodium or reduced-sodium). Regular tomato and vegetable juice (not low-sodium or reduced-sodium).  Frozen vegetables in sauces.GrainsInstant hot cereals. Bread stuffing, pancake, and biscuit mixes. Croutons. Seasoned rice or pasta mixes. Noodle soup cups. Boxed or frozen macaroni and cheese. Regular salted crackers. Self-rising flour.Meats and other proteinsMeat or fish that is salted, canned, smoked, spiced, or pickled. Precooked or cured meat, such as sausages or meat loaves. Tomasa Blase. Ham. Pepperoni. Hot dogs. Corned beef. Chipped beef. Salt pork. Jerky. Pickled herring. Anchovies and sardines. Regular canned tuna. Salted nuts.DairyProcessed cheese and cheese spreads. Hard cheeses. Cheese curds. Blue cheese. Feta cheese. String cheese. Regular cottage cheese. Buttermilk. Canned milk.Fats and oilsSalted butter. Regular margarine. Ghee. Bacon fat.Seasonings and condimentsOnion salt, garlic salt, seasoned salt, table salt, and sea salt. Canned and packaged gravies. Worcestershire sauce. Tartar sauce. Barbecue sauce. Teriyaki sauce. Soy sauce, including reduced-sodium. Steak sauce. Fish sauce. Oyster sauce. Cocktail sauce. Horseradish that you find on the shelf. Regular ketchup and mustard. Meat flavorings and tenderizers. Bouillon cubes. Hot sauce. Pre-made or packaged marinades. Pre-made or packaged taco seasonings. Relishes. Regular salad dressings. Salsa.Other foodsSalted popcorn and pretzels. Corn chips and puffs. Potato and tortilla chips. Canned or dried soups. Pizza. Frozen entrees and pot pies.The items listed above may not be a complete list of foods and beverages you should avoid. Contact a dietitian for more information.Summary?	Eating less sodium can help lower your blood pressure, reduce swelling, and protect your heart, liver, and kidneys.?	Most people on this plan should limit their sodium intake to 1,500-2,000 mg (milligrams) of sodium each day.?	Canned, boxed, and frozen foods are high in sodium. Restaurant foods, fast foods, and pizza are also very high in sodium. You also get sodium by adding salt to food.?	Try to cook at home, eat more fresh fruits and vegetables, and eat less fast food and canned, processed, or prepared foods.This information is not intended to replace advice given to you by your health care provider. Make sure you discuss any questions you have with your health care provider.Document Revised: 02/21/2019 Document Reviewed: 11/18/2020Elsevier Patient Education ? 2021 Elsevier Inc.

## 2020-01-19 NOTE — Telephone Encounter
Patient seen doctor today. Provider ordered a blood pressure cuff not a blood pressure kit. He states he has [facility]. Patient states the doctor told him he wouldn't have to pay for it. Patient is at [facility] Tele [phone] FAX [phone].

## 2020-01-19 NOTE — Telephone Encounter
Explained to pt that going to [facility] will be cheaper then rotary which meter and cuff cost $54.He states ok he will check there Routing to provider to order blood pressure automatic meter and cuff (size included ) not just cuff Routing to provider who ordered from todays visit

## 2020-01-19 NOTE — Progress Notes
[facility]      SUBJECTIVE:    Brett [name] is a 64 y.o. 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    Today patient complains of right medial knee pain which began around 8 months ago. Pain has not worsened. Denies difficulty with walking or ROM. He thinks pain began after he had an arthrocentesis done around that same time.    HTN  - BP today 158/87, repeat 140/90  - does not take any meds. 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 Has recently cut down to 5 cigarettes per day, down from 1 PPD  - drinks a 6 pack of beer daily, denies h/o withdrawal  - denies drug use, no marijuana  - he sometimes uses nicotine lozenges. Not interested in chantix. He has a goal to quit smoking       The 10-year ASCVD risk score Brett [name]., et al., 2013) is: 16.6%    Values used to calculate the score:      Age: 44 years      Sex: Male      Is Non-Hispanic African American: Yes      Diabetic: No      Tobacco smoker: Yes      Systolic Blood Pressure: 140 mmHg      Is BP treated: No      HDL Cholesterol: 46 mg/dL      Total Cholesterol: 142 mg/dL      REVIEW OF SYSTEMS:   Constitutional: Negative for chills and fever.   HENT: Negative for congestion and sore throat.    Eyes: Negative for blurred vision and double vision.   Respiratory: Negative for cough and shortness of breath.    Cardiovascular: Negative for chest pain, palpitations and leg swelling.   Gastrointestinal: Negative for abdominal pain, constipation, diarrhea, nausea and vomiting.   Musculoskeletal: Negative for falls.  Right knee pain   Neurological: Negative for dizziness, focal weakness and headaches.   Psychiatric/Behavioral: The patient is not nervous/anxious.     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  Allergies: No Known Allergies    Current Outpatient Medications:   ?  colchicine, 0.6 mg, Oral, Daily  ?  Blood Pressure Cuff, BP cuff. Medium. ICD-10: I10. Lifetime use.  ?  pantoprazole, 40 mg, Oral, Daily    OBJECTIVE:  BP (!) 140/90 (Site: r a, Position: Sitting, Cuff Size: Medium)  - Pulse 85  - Temp 97.8 ?F (36.6 ?C) (No-touch scanner)  - Ht 6' (1.829 m)  - Wt 89.8 kg  - SpO2 100%  - BMI 26.85 kg/m?     PHYSICAL EXAM  Constitutional: Oriented to person, place, and time. Appears well-developed and well-nourished. No distress.   HENT:   Head: Normocephalic and atraumatic.   Eyes: Conjunctivae and EOM are normal.   Neck: Neck supple.   Cardiovascular: Normal rate, regular rhythm and normal heart sounds.   Pulmonary/Chest: Effort normal and breath sounds normal. No respiratory distress. No wheezes.   Abdominal: Soft. No distension. There is no abdominal tenderness. There is no rebound and no guarding.   Musculoskeletal: Normal range of motion. No edema.  No joint tenderness  Neurological: Alert and oriented to person, place, and time.   Skin: Skin is warm and dry.   Psychiatric: Normal mood and affect. Judgment normal.     LABS:  Lab Results   Component Value Date    WBC 5.3 05/23/2019    HGB 14.1 05/23/2019    HCT 40.9 05/23/2019    MCV 97.6 (H) 05/23/2019    PLT 199 05/23/2019     Lab Results   Component Value Date    CREATININE 1.14 05/23/2019    BUN 17 05/23/2019    NA 139 05/23/2019    K 3.9 05/23/2019    CL 104 05/23/2019    CO2 25 05/23/2019 Lab Results   Component Value Date    GLU 92 05/23/2019    CREATININE 1.14 05/23/2019     Lab Results   Component Value Date    HGBA1C 5.6 05/22/2019     Immunization History   Administered Date(s) Administered   ? COVID-19 Vaccine - MODERNA 04/12/2019, 05/10/2019   ? Influenza, injectable, quadrivalent, preservative free 11/13/2019     Health Maintenance   Topic Date Due   ? HIV screening  Never done   ? Hepatitis C screening  Never done   ? Pneumo Vaccine 19-64 Medium Risk (1 of 1 - PPSV23) Never done   ? Tetanus adult (Td q 10,TDAP once)  Never done   ? Colon cancer screening,FIT DNA (Cologuard)  Never done   ? Shingles vaccine (Shingrix) (1 of 2 - Shingrix (RZV) 2 Dose Standard Series) Never done   ? Diabetes screening  05/23/2022   ? Lipid disorder screening  05/21/2024   ? Covid-19 vaccine series  Completed   ? Influenza vaccine  Completed   ? Colon cancer screening  Discontinued       ASSESSMENT & PLAN:  Patient Active Problem List   Diagnosis SNOMED [place](R)   ? Acute pain of right knee PAIN OF KNEE REGION   ? Heart block AV second degree SECOND DEGREE ATRIOVENTRICULAR BLOCK   ? Gout GOUT   ? Hyperuricemia HYPERURICEMIA   ? Unilateral inguinal hernia without obstruction or gangrene, recurrence not specified INGUINAL HERNIA     Brett [name] was seen today for follow-up.    Diagnoses and all orders for this visit:    Chronic pain of right knee  -     Ambulatory referral to Rehab Services  -     XR Knee Right AP and Lateral; Future    Gout, unspecified cause, unspecified chronicity, unspecified site  Comments:  Although not first-line, colchicine can use as a prophylaxis for to 6 months. However he began colchicine almost 6 mo ago.  Advised patient to restart allopurinol, however he believes it will not help.  Will send to Rheumatology for further recommendations  Advised to only take colchicine every 2-3 days  Orders:  -     colchicine (COLCRYS) 0.6 mg tablet; Take 1 tablet (0.6 mg total) by  mouth daily.  - Uric acid; Future  -     Comprehensive metabolic panel; Future  -     Ambulatory referral to Rheumatology/[facility]    Healthcare maintenance  Comments:  Declined all vaccines  Orders:  -     FIT-DNA (Cologuard)  -     Hemoglobin A1c; Future    Gastroesophageal reflux disease, unspecified whether esophagitis present  -     pantoprazole (PROTONIX) 40 mg tablet; Take 1 tablet (40 mg total) by mouth daily.    Hypertension, unspecified type  Comments:  Information given on low-salt diet  Check BP daily and bring log to next visit  Advised to cut down on smoking  Orders:  -     Miscellaneous Medical Supply (BLOOD PRESSURE CUFF); BP cuff. Medium. ICD-10: I10. Lifetime use.        Follow up: Return in 3 month(s).    Discussed plan of care with Attending Physician: Dr. Lucille Passy    Signed by:    Silvio Clayman, MD  Internal Medicine, PGY-3  11:55 AM  01/19/2020

## 2020-02-03 ENCOUNTER — Encounter
Admit: 2020-02-03 | Payer: PRIVATE HEALTH INSURANCE | Attending: Rehabilitative and Restorative Service Providers" | Primary: Internal Medicine

## 2020-02-03 ENCOUNTER — Inpatient Hospital Stay: Admit: 2020-02-03 | Discharge: 2020-02-03 | Payer: PRIVATE HEALTH INSURANCE | Primary: Internal Medicine

## 2020-02-03 DIAGNOSIS — I441 Atrioventricular block, second degree: Secondary | ICD-10-CM

## 2020-02-03 DIAGNOSIS — M25561 Pain in right knee: Secondary | ICD-10-CM

## 2020-02-03 DIAGNOSIS — M109 Gout, unspecified: Secondary | ICD-10-CM

## 2020-02-03 DIAGNOSIS — G8929 Other chronic pain: Secondary | ICD-10-CM

## 2020-02-03 NOTE — Other
[facility] StreetStratford [place] [address] Number: [phone] Number: [phone] Therapy Evaluation and Plan of Care1/4/2022Patient Name:  Brett Burchill SladeMR#:  [id number] Date of Birth:  01-22-1957Referring Provider:  Terrace Arabia, MDTherapist:  Murriel Hopper, PTProblem List         ICD-10-CM   02/03/20 Chronic Right Knee Pain PT  * (Principal) Right knee pain M25.561  Your signature indicates that you have reviewed and agree with the established Plan of Care dated 02/03/20 to 04/02/20.  This order renewal is required for therapy to continue on an outpatient basis.Please co-sign this document electronically or return via the fax number listed on the document.Please renew therapy orders as follows:  2xwk for PT Additional Physician Comments:___________________________________     __________________________________Physician Signature                                           Date___________________________________Physician Printed NamePhysical Therapy EvaluationPatient Name:  Brett Grealish SladeMedical Record Number:  [id number] Date of Birth:  27-May-1957Therapist:  Murriel Hopper, PTReferring Provider:  Terrace Arabia, MDICD-10 Diagnosis(es):Problem List         ICD-10-CM   02/03/20 Chronic Right Knee Pain PT  * (Principal) Right knee pain M25.561  General InformationTherapy Episode of Care  Date of Visit:  02/03/2020   Treatment Number:  1   Date the Treatment Plan was Initiated/Reviewed:  02/03/2020  Start of Care Date:  02/03/2020   Progress Report Due Date:  04/02/2020 Precautions/Limitations   Precautions/Limitations Comments:  Heart block AV second degree, pacemakerInterpreter Ecologist Utilized?  NoCognition / Learning Assessment   Primary Learner Relationship:  Patient        Barriers to learning:  No barriers        Preferred language:  English        Preferred learning style:  ListeningI reviewed the Patient Care Agreement and Attendance Form with the Patient/Family.  The Patient/Family verbalized understanding.Medication Review:Current Outpatient Medications Medication Sig ? colchicine Take 1 tablet (0.6 mg total) by mouth daily. ? Blood Pressure Cuff BP cuff. Medium. ICD-10: I10. Lifetime use. ? pantoprazole Take 1 tablet (40 mg total) by mouth daily. SubjectivePertinent History of Current Problem:  Pt is a 65 yo man who presents for PT evaluation of chronic right knee pain. Pt reports his knee has been bothering him.  They feel tight and don't want to bend.  He reports right knee is worse after getting gout in his right knee.  He had the knee drained in April of 2021.  Xrays showed Mild to moderate degenerative changes at the medial compartment with joint effusionPast Medical HistoryPast Medical History: Diagnosis Date ? 2nd degree AV block  ? Gout  Past Surgical HistoryPast Surgical History: Procedure Laterality Date ? PACEMAKER INSERTION   AllergiesPatient has no known allergies.Pain Rating:  7 /10   Comments:  Right medial kneeSocial / Emotional Information:  Pt is a 66 yo man who lives in a 1 story with his wife.  Currently works in Estate manager/land agent. ObjectivePostureWNLPt ambulates without device. Decreased cadence/stride. Slight limp noted. Lower Quadrant Range of MotionKnee Right Left    Flexion 115 pain 120    Extension 0 0 Lower Quadrant Manual Muscle Test Right Left Hip Flexion 4- 4- Hip External Rotation   Hip Internal Rotation   Knee Extension 4 4- pain Knee Flexion 4 4- pain Dorsiflexion 4 4  Eversion   Hip ABduction 4 4 Hip ADduction   Hip Extension 4 4       Lower Extremity Functional Scale      Any of your usual work/household/school activities:  2      Your usual hobbies/recreation/sporting activities:  4      Getting into or out of the bath:  3      Walking between rooms:  4      Putting on your socks:   1      Squatting:  0      Lifting an object from the floor:  2      Performing light activities around your home:  2      Performing heavy activities around your home:  1      Getting into or out of your car:  1      Walking 2 blocks:  3      Walking a mile:  3      Go up or down 10 stairs:  1      Standing for 1 hour:  1      Sitting for 1 hour:  1      Running on even ground:  1      Running on uneven ground:  1      Making sharp turns while running fast:  2      Hopping:  1      Rolling over in bed:  1   Total Score (out of a possible 80):  35   76-78/80 is normal for population   Minimal detectable change = 9 points   Minimal clinically important difference = 9 points   Potential error +/- 5.3 pointsSpecial TestsDecreased patellar mobility on the right with crepitus and lateral tracking with QS. Tight ITB+ pat compression/grind- swelling-varus/valgus/McMurray/Lachman bilaterally PalpationPF borders medial joint line on the right. Functional MovementsIndependent Treatment Provided This VisitCPT Code Interventions Timed Minutes Untimed Minutes Total Minutes Physical Therapy Evaluation - Low Complexity [phone]) IE completed.Initiated HEP 45  45 N/A     N/A     N/A       Total Treatment Time: 71 AssessmentPt is a 65 yo man who present for PT eval of right knee pain. Pt reports pain for a number of months after a bout of gout. He had his knee drained and xrays show arthritis. Clinicly Pt has limited knee ROM and strength as well as pain. Noted decreased right patellar mobility with crepitus and lateral tracking. Pt is appropriate for PT. Patient / Family / Caregiver EducationDiscussed role of therapyPatient/Family/Caregiver demonstrate agreement with the planRehab PotentialGoodPlanTherapeutic Exercise (97110)Manual Therapy (97140)Therapeutic Activity (97530)Self-Care/Home Management (97535)Gait Training (97116)Ultrasound (97035)Frequency:  2x per weekDuration:  8 weeksPlan for Next Visit:Pat mobs, consider [place], taping, knee ROM and strengthening. GoalsSTG: Pt will be educated on HEP by 2/4/22LTG: Pt will be independent with HEP by 3/4/22STG: Pt will have increased knee strength by 1/2 grade for improved stability with walking and standing with ADL by 2/4/22STG: Pt will have decreased pain by 50% with ADL by 2/4/22LTG: Pt will have improved knee strength/VMO to 5/5 for improved patellar tracking for painfree bending/squatting with ADL by 3/4/22LTG: Pt will be able to ascend/descend stairs with reciprocal pattern without pain or difficulty by 3/4/22William Malak Duchesneau PT [phone]

## 2020-02-10 ENCOUNTER — Inpatient Hospital Stay: Admit: 2020-02-10 | Discharge: 2020-02-10 | Payer: PRIVATE HEALTH INSURANCE | Primary: Internal Medicine

## 2020-02-10 DIAGNOSIS — M25561 Pain in right knee: Secondary | ICD-10-CM

## 2020-02-10 DIAGNOSIS — G8929 Other chronic pain: Secondary | ICD-10-CM

## 2020-02-10 NOTE — Other
[facility] StreetStratford [place] [address] Number: [phone] Number: [id number] Therapy Daily NotePatient Name:  Brett Culp SladeMedical Record Number:  [id number] Date of Birth:  Jan 24, 1957Therapist:  Murriel Hopper, PTReferring Provider:  Terrace Arabia, MDICD-10 Diagnosis(es):Problem List         ICD-10-CM   02/03/20 Chronic Right Knee Pain PT  Right knee pain M25.561  General InformationTherapy Episode of Care  Date of Visit:  02/10/2020   Treatment Number:  2   Date the Treatment Plan was Initiated/Reviewed:  02/03/2020  Start of Care Date:  02/03/2020   Progress Report Due Date:  04/02/2020 Precautions/Limitations   Precautions/Limitations Comments:  Heart block AV second degree, pacemakerInterpreter Ecologist Utilized?  NoCognition / Learning Assessment   Primary Learner Relationship:  Patient        Barriers to learning:  No barriers        Preferred language:  English        Preferred learning style:  ListeningI reviewed the Patient Care Agreement and Attendance Form with the Patient/Family.  The Patient/Family verbalized understanding.Medication Review:Current Outpatient Medications Medication Sig ? colchicine Take 1 tablet (0.6 mg total) by mouth daily. ? Blood Pressure Cuff BP cuff. Medium. ICD-10: I10. Lifetime use. ? pantoprazole Take 1 tablet (40 mg total) by mouth daily. SubjectivePt reports the cold weather makes his right knee hurt. ObjectiveTreatment Provided This VisitCPT Code Interventions Timed Minutes Untimed Minutes Total Minutes Therapeutic Exercise (97110) SLR 15x bilaterallyBridge 5sec 10xKtape for PF trackingNustep lv3 5 minKnee flex/ext 35# 20xLeg Press 100# 20x 25  25 Manual Therapy (97140)     Ultrasound (02725)     N/A Total Treatment Time: 25 AssessmentTolerated well. Continues with decreased patellar mobility and crepitus, lessened with taping. PlanFrequency:  2x per weekPlan for Next VisitContinue per planWilliam Magally Vahle PT [phone]

## 2020-02-13 ENCOUNTER — Telehealth: Admit: 2020-02-13 | Payer: PRIVATE HEALTH INSURANCE | Attending: Surgical Critical Care | Primary: Internal Medicine

## 2020-02-13 NOTE — Telephone Encounter
Tanya surgical scheduler notified.

## 2020-02-13 NOTE — Telephone Encounter
Patient is calling to cancel his Surgical Date on 02-27-20. Please call him because he'd like to R/S for late February . Ty.

## 2020-02-17 ENCOUNTER — Inpatient Hospital Stay: Admit: 2020-02-17 | Discharge: 2020-02-17 | Payer: PRIVATE HEALTH INSURANCE | Primary: Internal Medicine

## 2020-02-17 DIAGNOSIS — M25561 Pain in right knee: Secondary | ICD-10-CM

## 2020-02-18 ENCOUNTER — Encounter: Admit: 2020-02-18 | Payer: PRIVATE HEALTH INSURANCE | Attending: Surgical Critical Care

## 2020-02-18 NOTE — Other
[facility]-[place]Stratford [place] [address]KVQQ[place] Number: [phone] Number: [phone] Therapy Daily NotePatient Name:  Brett Partin SladeMedical Record Number:  [id number] Date of Birth:  1957/09/21Therapist:  Murriel Hopper, PTReferring Provider:  Terrace Arabia, MDICD-10 Diagnosis(es):Problem List         ICD-10-CM   02/03/20 Chronic Right Knee Pain PT  Right knee pain M25.561  General InformationTherapy Episode of Care  Date of Visit:  02/17/2020   Treatment Number:  3   Date the Treatment Plan was Initiated/Reviewed:  02/03/2020  Start of Care Date:  02/03/2020   Progress Report Due Date:  04/02/2020 Precautions/Limitations   Precautions/Limitations Comments:  Heart block AV second degree, pacemakerInterpreter Ecologist Utilized?  NoCognition / Learning Assessment   Primary Learner Relationship:  Patient        Barriers to learning:  No barriers        Preferred language:  English        Preferred learning style:  ListeningI reviewed the Patient Care Agreement and Attendance Form with the Patient/Family.  The Patient/Family verbalized understanding.Medication Review:Current Outpatient Medications Medication Sig ? colchicine Take 1 tablet (0.6 mg total) by mouth daily. ? Blood Pressure Cuff BP cuff. Medium. ICD-10: I10. Lifetime use. ? pantoprazole Take 1 tablet (40 mg total) by mouth daily. SubjectivePt reports his knee is feeling a lot better. Its not even popping anymore. ObjectiveTreatment Provided This VisitCPT Code Interventions Timed Minutes Untimed Minutes Total Minutes Therapeutic Exercise (97110) Nustep lv3 5 minKnee flex/ext 35# 20xLeg Press 100# 20xHip ext/abd green 15xKtape for PF trackingStep HS stretching 20sec 3x  25  25 N/A     N/A     N/A Total Treatment Time: 25 AssessmentTolerated well.  Less PF crepitus noted. Pt reporting less knee pain and that taping was helpfulPlanFrequency:  2x per weekPlan for Next VisitContinue to improve quad strength, PF trackingWilliam Dareen Piano PT [phone]

## 2020-02-23 ENCOUNTER — Inpatient Hospital Stay: Admit: 2020-02-23 | Discharge: 2020-02-23 | Payer: PRIVATE HEALTH INSURANCE | Primary: Internal Medicine

## 2020-02-23 DIAGNOSIS — M25561 Pain in right knee: Secondary | ICD-10-CM

## 2020-02-23 DIAGNOSIS — G8929 Other chronic pain: Secondary | ICD-10-CM

## 2020-02-24 NOTE — Other
[facility]-[place]St[address]ford [place] [address] Number: [phone] Number: [id number] Therapy Daily NotePatient Name:  Brett Gastelum SladeMedical Record Number:  [id number] Date of Birth:  07-Jun-1957Therapist:  Meade Maw, PTReferring Provider:  Terrace Arabia, MDICD-10 Diagnosis(es):Problem List         ICD-10-CM   02/03/20 Chronic Right Knee Pain PT  Right knee pain M25.561  General InformationTherapy Episode of Care  Date of Visit:  02/23/2020   Treatment Number:  4   Date the Treatment Plan was Initiated/Reviewed:  02/03/2020  Start of Care Date:  02/03/2020   Progress Report Due Date:  04/02/2020 Precautions/Limitations   Precautions/Limitations Comments:  Heart block AV second degree, pacemakerInterpreter Ecologist Utilized?  NoCognition / Learning Assessment   Primary Learner Relationship:  Patient        Barriers to learning:  No barriers        Preferred language:  English        Preferred learning style:  ListeningI reviewed the Patient Care Agreement and Attendance Form with the Patient/Family.  The Patient/Family verbalized understanding.Medication Review:Current Outpatient Medications Medication Sig ? colchicine Take 1 tablet (0.6 mg total) by mouth daily. ? Blood Pressure Cuff BP cuff. Medium. ICD-10: I10. Lifetime use. ? pantoprazole Take 1 tablet (40 mg total) by mouth daily. SubjectivePt says that he bought k-tape on his knee and he's using it to help his R knee. ObjectiveTreatment Provided This VisitCPT Code Interventions Timed Minutes Untimed Minutes Total Minutes Therapeutic Exercise (97110) Nustep lv3 5 minOn table for LE stretches, hamstrings and TFL stretchPsoas and rectus fem stretch in mod Thomas test positionPiriformis stretchLeg press with ball squeeze @ 100 # 2x10 reps B/L ankle DF stretch in standing 25  25 N/A     N/A     N/A       Total Treatment Time: 25 AssessmentPt progressing well, improving functionally with current treatment plan. PlanFrequency:  2x per weekPlan for Next VisitContinue to improve quad strength, PF trackingAndre Liana [name], PTPhysical TherapistCT License # [id number]

## 2020-03-01 DIAGNOSIS — G8929 Other chronic pain: Secondary | ICD-10-CM

## 2020-03-02 ENCOUNTER — Inpatient Hospital Stay: Admit: 2020-03-02 | Discharge: 2020-03-02 | Payer: PRIVATE HEALTH INSURANCE | Primary: Internal Medicine

## 2020-03-02 DIAGNOSIS — M25561 Pain in right knee: Secondary | ICD-10-CM

## 2020-03-02 NOTE — Other
[facility]St[address]ford [place] [address] Number: [phone] Number: [id number] Therapy Daily NotePatient Name:  Brett Feria SladeMedical Record Number:  [id number] Date of Birth:  24-Dec-1957Therapist:  Murriel Hopper, PTReferring Provider:  Terrace Arabia, MDICD-10 Diagnosis(es):Problem List         ICD-10-CM   02/03/20 Chronic Right Knee Pain PT  Right knee pain M25.561  General InformationTherapy Episode of Care  Date of Visit:  03/02/2020   Treatment Number:  5   Date the Treatment Plan was Initiated/Reviewed:  02/03/2020  Start of Care Date:  02/03/2020   Progress Report Due Date:  04/02/2020 Precautions/Limitations   Precautions/Limitations Comments:  Heart block AV second degree, pacemakerInterpreter Ecologist Utilized?  NoCognition / Learning Assessment   Primary Learner Relationship:  Patient        Barriers to learning:  No barriers        Preferred language:  English        Preferred learning style:  ListeningI reviewed the Patient Care Agreement and Attendance Form with the Patient/Family.  The Patient/Family verbalized understanding.Medication Review:Current Outpatient Medications Medication Sig ? colchicine Take 1 tablet (0.6 mg total) by mouth daily. ? Blood Pressure Cuff BP cuff. Medium. ICD-10: I10. Lifetime use. ? pantoprazole Take 1 tablet (40 mg total) by mouth daily. SubjectivePt reports that therapy is helping a lot. Hardly have any painObjectiveTreatment Provided This VisitCPT Code Interventions Timed Minutes Untimed Minutes Total Minutes Therapeutic Exercise (97110) Nustep lv3 5 minKnee flex/ext 30# 20xLeg Press 100# 20xHip ext/abd green 15x bilaterallyKtape deferred per pt.Step HS stretch 20sec 3x 25  25 N/A     N/A     N/A Total Treatment Time: 25 AssessmentTolerated well. Less PF crepitus and decreased pain since [name]. Responding well with current treatment. PlanFrequency:  2x per weekPlan for Next VisitContinue strengtheningWilliam Isolde Skaff PT [phone]

## 2020-03-04 ENCOUNTER — Ambulatory Visit
Admit: 2020-03-04 | Payer: PRIVATE HEALTH INSURANCE | Attending: Student in an Organized Health Care Education/Training Program | Primary: Internal Medicine

## 2020-03-08 ENCOUNTER — Ambulatory Visit
Admit: 2020-03-08 | Payer: PRIVATE HEALTH INSURANCE | Attending: Student in an Organized Health Care Education/Training Program | Primary: Internal Medicine

## 2020-03-08 ENCOUNTER — Encounter
Admit: 2020-03-08 | Payer: PRIVATE HEALTH INSURANCE | Attending: Student in an Organized Health Care Education/Training Program | Primary: Internal Medicine

## 2020-03-08 DIAGNOSIS — M109 Gout, unspecified: Secondary | ICD-10-CM

## 2020-03-08 DIAGNOSIS — F17200 Nicotine dependence, unspecified, uncomplicated: Secondary | ICD-10-CM

## 2020-03-08 DIAGNOSIS — I441 Atrioventricular block, second degree: Secondary | ICD-10-CM

## 2020-03-08 MED ORDER — CHLORTHALIDONE 25 MG TABLET
25 mg | ORAL_TABLET | Freq: Every day | ORAL | 12 refills | Status: AC
Start: 2020-03-08 — End: 2020-12-09

## 2020-03-08 MED ORDER — NICOTINE (POLACRILEX) 2 MG GUM
2 mg | ORAL | 2 refills | Status: AC | PRN
Start: 2020-03-08 — End: ?

## 2020-03-08 MED ORDER — NICOTINE 14 MG/24 HR DAILY TRANSDERMAL PATCH
14 mg | MEDICATED_PATCH | Freq: Every day | TRANSDERMAL | 3 refills | Status: AC
Start: 2020-03-08 — End: 2021-07-04

## 2020-03-08 NOTE — Patient Instructions
https://www.nhlbi.nih.gov/files/docs/public/heart/dash_brief.pdf>     Please start chorthalidone 1 tab per day  Start nicotine patches for smoking cessation  Continue to monitor your blood pressure  Get your FIT (stool test) and [place] lungs   Get blood work before next appt with PCP  Thank you!       DASH Eating Plan  DASH stands for Dietary Approaches to Stop Hypertension. The DASH eating plan is a healthy eating plan that has been shown to:  ? Reduce high blood pressure (hypertension).  ? Reduce your risk for type 2 diabetes, heart disease, and stroke.  ? Help with weight loss.  What are tips for following this plan?  Reading food labels  ? Check food labels for the amount of salt (sodium) per serving. Choose foods with less than 5 percent of the Daily Value of sodium. Generally, foods with less than 300 milligrams (mg) of sodium per serving fit into this eating plan.  ? To find whole grains, look for the word whole as the first word in the ingredient list.  Shopping  ? Buy products labeled as low-sodium or no salt added.  ? Buy fresh foods. Avoid canned foods and pre-made or frozen meals.  Cooking  ? Avoid adding salt when cooking. Use salt-free seasonings or herbs instead of table salt or sea salt. Check with your health care provider or pharmacist before using salt substitutes.  ? Do not fry foods. Cook foods using healthy methods such as baking, boiling, grilling, roasting, and broiling instead.  ? Cook with heart-healthy oils, such as olive, canola, avocado, soybean, or sunflower oil.  Meal planning  ? Eat a balanced diet that includes:  ? 4 or more servings of fruits and 4 or more servings of vegetables each day. Try to fill one-half of your plate with fruits and vegetables.  ? 6?8 servings of whole grains each day.  ? Less than 6 oz (170 g) of lean meat, poultry, or fish each day. A 3-oz (85-g) serving of meat is about the same size as a deck of cards. One egg equals 1 oz (28 g).  ? 2?3 servings of low-fat dairy each day. One serving is 1 cup (237 mL).  ? 1 serving of nuts, seeds, or beans 5 times each week.  ? 2?3 servings of heart-healthy fats. Healthy fats called omega-3 fatty acids are found in foods such as walnuts, flaxseeds, fortified milks, and eggs. These fats are also found in cold-water fish, such as sardines, salmon, and mackerel.  ? Limit how much you eat of:  ? Canned or prepackaged foods.  ? Food that is high in trans fat, such as some fried foods.  ? Food that is high in saturated fat, such as fatty meat.  ? Desserts and other sweets, sugary drinks, and other foods with added sugar.  ? Full-fat dairy products.  ? Do not salt foods before eating.  ? Do not eat more than 4 egg yolks a week.  ? Try to eat at least 2 vegetarian meals a week.  ? Eat more home-cooked food and less restaurant, buffet, and fast food.    Lifestyle  ? When eating at a restaurant, ask that your food be prepared with less salt or no salt, if possible.  ? If you drink alcohol:  ? Limit how much you use to:  ? 0?1 drink a day for women who are not pregnant.  ? 0?2 drinks a day for men.  ? Be aware of how much  alcohol is in your drink. In the [place] one drink equals one 12 oz bottle of beer (355 mL), one 5 oz glass of wine (148 mL), or one 1? oz glass of hard liquor (44 mL).  General information  ? Avoid eating more than 2,300 mg of salt a day. If you have hypertension, you may need to reduce your sodium intake to 1,500 mg a day.  ? Work with your health care provider to maintain a healthy body weight or to lose weight. Ask what an ideal weight is for you.  ? Get at least 30 minutes of exercise that causes your heart to beat faster (aerobic exercise) most days of the week. Activities may include walking, swimming, or biking.  ? Work with your health care provider or dietitian to adjust your eating plan to your individual calorie needs.  What foods should I eat?  Fruits  All fresh, dried, or frozen fruit. Canned fruit in natural juice (without added sugar).  Vegetables  Fresh or frozen vegetables (raw, steamed, roasted, or grilled). Low-sodium or reduced-sodium tomato and vegetable juice. Low-sodium or reduced-sodium tomato sauce and tomato paste. Low-sodium or reduced-sodium canned vegetables.  Grains  Whole-grain or whole-wheat bread. Whole-grain or whole-wheat pasta. Brown rice. Orpah Cobb. Bulgur. Whole-grain and low-sodium cereals. Pita bread. Low-fat, low-sodium crackers. Whole-wheat flour tortillas.  Meats and other proteins  Skinless chicken or [place]. Ground chicken or [place]. Pork with fat trimmed off. Fish and seafood. Egg whites. Dried beans, peas, or lentils. Unsalted nuts, nut butters, and seeds. Unsalted canned beans. Lean cuts of beef with fat trimmed off. Low-sodium, lean precooked or cured meat, such as sausages or meat loaves.  Dairy  Low-fat (1%) or fat-free (skim) milk. Reduced-fat, low-fat, or fat-free cheeses. Nonfat, low-sodium ricotta or cottage cheese. Low-fat or nonfat yogurt. Low-fat, low-sodium cheese.  Fats and oils  Soft margarine without trans fats. Vegetable oil. Reduced-fat, low-fat, or light mayonnaise and salad dressings (reduced-sodium). Canola, safflower, olive, avocado, soybean, and sunflower oils. Avocado.  Seasonings and condiments  Herbs. Spices. Seasoning mixes without salt.  Other foods  Unsalted popcorn and pretzels. Fat-free sweets.  The items listed above may not be a complete list of foods and beverages you can eat. Contact a dietitian for more information.  What foods should I avoid?  Fruits  Canned fruit in a light or heavy syrup. Fried fruit. Fruit in cream or butter sauce.  Vegetables  Creamed or fried vegetables. Vegetables in a cheese sauce. Regular canned vegetables (not low-sodium or reduced-sodium). Regular canned tomato sauce and paste (not low-sodium or reduced-sodium). Regular tomato and vegetable juice (not low-sodium or reduced-sodium). Rosita Fire. Olives.  Grains  Baked goods made with fat, such as croissants, muffins, or some breads. Dry pasta or rice meal packs.  Meats and other proteins  Fatty cuts of meat. Ribs. Fried meat. Tomasa Blase. Bologna, salami, and other precooked or cured meats, such as sausages or meat loaves. Fat from the back of a pig (fatback). Bratwurst. Salted nuts and seeds. Canned beans with added salt. Canned or smoked fish. Whole eggs or egg yolks. Chicken or [place] with skin.  Dairy  Whole or 2% milk, cream, and half-and-half. Whole or full-fat cream cheese. Whole-fat or sweetened yogurt. Full-fat cheese. Nondairy creamers. Whipped toppings. Processed cheese and cheese spreads.  Fats and oils  Butter. Stick margarine. Lard. Shortening. Ghee. Bacon fat. Tropical oils, such as coconut, palm kernel, or palm oil.  Seasonings and condiments  Onion salt, garlic salt, seasoned salt, table salt,  and sea salt. Worcestershire sauce. Tartar sauce. Barbecue sauce. Teriyaki sauce. Soy sauce, including reduced-sodium. Steak sauce. Canned and packaged gravies. Fish sauce. Oyster sauce. Cocktail sauce. Store-bought horseradish. Ketchup. Mustard. Meat flavorings and tenderizers. Bouillon cubes. Hot sauces. Pre-made or packaged marinades. Pre-made or packaged taco seasonings. Relishes. Regular salad dressings.  Other foods  Salted popcorn and pretzels.  The items listed above may not be a complete list of foods and beverages you should avoid. Contact a dietitian for more information.  Where to find more information  ? National Heart, Lung, and Blood Institute: PopSteam.is  ? American Heart Association: www.heart.org  ? Academy of Nutrition and Dietetics: www.eatright.org  ? National Kidney Foundation: www.kidney.org  Summary  ? The DASH eating plan is a healthy eating plan that has been shown to reduce high blood pressure (hypertension). It may also reduce your risk for type 2 diabetes, heart disease, and stroke.  ? When on the DASH eating plan, aim to eat more fresh fruits and vegetables, whole grains, lean proteins, low-fat dairy, and heart-healthy fats.  ? With the DASH eating plan, you should limit salt (sodium) intake to 2,300 mg a day. If you have hypertension, you may need to reduce your sodium intake to 1,500 mg a day.  ? Work with your health care provider or dietitian to adjust your eating plan to your individual calorie needs.  This information is not intended to replace advice given to you by your health care provider. Make sure you discuss any questions you have with your health care provider.  Document Revised: 12/20/2018 Document Reviewed: 12/20/2018  Elsevier Patient Education ? 2021 Elsevier Inc.

## 2020-03-09 ENCOUNTER — Inpatient Hospital Stay: Admit: 2020-03-09 | Discharge: 2020-03-09 | Payer: PRIVATE HEALTH INSURANCE | Primary: Internal Medicine

## 2020-03-09 DIAGNOSIS — M25561 Pain in right knee: Secondary | ICD-10-CM

## 2020-03-09 DIAGNOSIS — I10 Essential (primary) hypertension: Secondary | ICD-10-CM

## 2020-03-09 DIAGNOSIS — Z0000 Encounter for general adult medical examination without abnormal findings: Secondary | ICD-10-CM

## 2020-03-09 NOTE — Other
[facility]Stratford [place] [address]QIONG Number: [phone] Number: [phone] Therapy Daily NotePatient Name:  Brett Chapdelaine SladeMedical Record Number:  [id number] Date of Birth:  November 12, 1957Therapist:  Murriel Hopper, PTReferring Provider:  Terrace Arabia, MDICD-10 Diagnosis(es):Problem List         ICD-10-CM   02/03/20 Chronic Right Knee Pain PT  Right knee pain M25.561  General InformationTherapy Episode of Care  Date of Visit:  03/09/2020   Treatment Number:  6   Date the Treatment Plan was Initiated/Reviewed:  02/03/2020  Start of Care Date:  02/03/2020   Progress Report Due Date:  04/02/2020 Precautions/Limitations   Precautions/Limitations Comments:  Heart block AV second degree, pacemakerInterpreter Ecologist Utilized?  NoCognition / Learning Assessment   Primary Learner Relationship:  Patient        Barriers to learning:  No barriers        Preferred language:  English        Preferred learning style:  ListeningI reviewed the Patient Care Agreement and Attendance Form with the Patient/Family.  The Patient/Family verbalized understanding.Medication Review:Current Outpatient Medications Medication Sig ? chlorthalidone Take 1 tablet (25 mg total) by mouth daily. ? colchicine Take 1 tablet (0.6 mg total) by mouth daily. ? Blood Pressure Cuff BP cuff. Medium. ICD-10: I10. Lifetime use. ? nicotine Place 1 patch (14 mg total) onto the skin daily. Alternate arms/sites when applying patch. Discard old patch when applying new one ? nicotine polacrilex Take 1 each (2 mg total) by mouth every 2 (two) hours as needed for smoking cessation. ? pantoprazole Take 1 tablet (40 mg total) by mouth daily. SubjectivePt reports his knees are doing a lot better. Feels like he doesn't need the therapy anymore. ObjectiveTreatment Provided This VisitCPT Code Interventions Timed Minutes Untimed Minutes Total Minutes Therapeutic Exercise (97110) Nustep lv3 flex/ext 50# 20x Leg Press 110# 20xStep HS stretch 20sec 3xTband hip ext/abd green 15x bilaterallyKtape deferred per pt.  25  25 Manual Therapy (97140)     N/A     N/A       Total Treatment Time: 25 AssessmentPt with resolved knee pain. ROM and strength WNL.  Should do well with transition to HEP. PlanFrequency:  DCPlan for Next VisitDCWilliam Daryl [name] PT [phone]

## 2020-03-09 NOTE — Progress Notes
[facility]Internal [facility] Out[facility] Clinic NoteHPI:Brett [name] is a 65 y.o. 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 Last time seen by PCP in 12/2019 and was found to have elevated BP, risks/benefits anti HTN discussed last time and the patient decided to monitor his BP off any meds. In addition the PCP did great job last time and discussed tobacco smoking cessation options and today the patient is open to stop smoking and would like to get nicotine patches and gums. Today he denies any complains. No CP, SOB or palpitations. No headaches or vision changes. He brings BP los and BP runs around 140-150s/80-90s. ROS is negative except as above. Medical History:PMH PSH Past Medical History: Diagnosis Date ? 2nd degree AV block  ? Gout   Past Surgical History: Procedure Laterality Date ? PACEMAKER INSERTION    Social History Family History Social History Tobacco Use ? Smoking status: Current Every Day Smoker   Packs/day: 1.00   Years: 50.00   Pack years: 50.00   Types: Cigarettes ? Smokeless tobacco: Never Used Substance Use Topics ? Alcohol use: Yes   Alcohol/week: 42.0 standard drinks   Types: 42 Cans of beer per week   Comment: drinks a 6 pack of beer daily  No family history on file. Outpatient Medications:Current Outpatient Medications Medication Sig ? chlorthalidone (HYGROTEN) 25 mg tablet Take 1 tablet (25 mg total) by mouth daily. ? colchicine (COLCRYS) 0.6 mg tablet Take 1 tablet (0.6 mg total) by mouth daily. ? Miscellaneous Medical Supply (BLOOD PRESSURE CUFF) BP cuff. Medium. ICD-10: I10. Lifetime use. ? nicotine (NICODERM CQ) 14 mg transdermal patch Place 1 patch (14 mg total) onto the skin daily. Alternate arms/sites when applying patch. Discard old patch when applying new one ? nicotine polacrilex (NICORETTE) 2 mg gum Take 1 each (2 mg total) by mouth every 2 (two) hours as needed for smoking cessation. ? pantoprazole (PROTONIX) 40 mg tablet Take 1 tablet (40 mg total) by mouth daily. No current facility-administered medications for this visit. Objective: Vitals:  03/08/20 1555 BP: (!) 157/88 Site: Left Arm Position: Sitting Cuff Size: Large Pulse: (!) 104 Temp: 98 ?F (36.7 ?C) TempSrc: No-touch scanner SpO2: 98% Weight: 90.6 kg Height: 6' 1 (1.854 m) Wt: 03/08/20 90.6 kg 01/19/20 89.8 kg 12/22/19 89.4 kg 11/13/19 89.2 kg 07/05/19 82 kg 06/16/19 88.7 kg General: AAOx3 HEENT: Head atraumatic/ normocephalic, PERRLA, anicteric sclera, MMM, Neck supple.Resp: Non-tender, resonant on percussion, no tactile fremitus, clear to auscultate b/l. Cardio: RRR, S1 and S2 present, no neck distention, no JVD present, no Murmurs, Gallops or Bruits. Abdominal: Bowel sounds present, resonant on percussion, soft, non-tender, and non-distended. Skin/Extremities: Skin intact, no rash, +2 distal pulses b/l, no lower extremity edema b/l. Neuro: CN II-XII grossly intact b/l. Motor Strength 5/5 b/l. Sensation intact b/l.  Reflexes 2+ b/l. Labs:Complete Blood Count:Lab Results Component Value Date  WBC 5.3 05/23/2019  RBC 4.2 (L) 05/23/2019  HGB 14.1 05/23/2019  HCT 40.9 05/23/2019  MCV 97.6 (H) 05/23/2019  MCH 33.7 05/23/2019  MCHC 34.5 05/23/2019  PLT 199 05/23/2019  MPV 10.8 05/23/2019 Comprehensive Metabolic Panel:Lab Results Component Value Date  GLU 99 01/19/2020  BUN 13 01/19/2020  CREATININE 1.13 01/19/2020  NA 139 01/19/2020  K 3.8 01/19/2020  CL 102 01/19/2020  CO2 28 01/19/2020  ALBUMIN 4.7 01/19/2020  PROT 7.9 01/19/2020  BILITOT 0.4 01/19/2020  ALKPHOS 109 01/19/2020  ALT 23 01/19/2020  GLOB 3.2  01/19/2020  CALCIUM 9.3 01/19/2020 ECHO: No results found for this or any previous visit.ASCVD:  The 10-year ASCVD risk score Denman George DC Montez Hageman., et al., 2013) is: 31.9%  Values used to calculate the score:    Age: 51 years    Sex: Male    Is Non-Hispanic African American: Yes    Diabetic: No    Tobacco smoker: Yes    Systolic Blood Pressure: 157 mmHg    Is BP treated: Yes    HDL Cholesterol: 46 mg/dL    Total Cholesterol: 846 mg/dLAssessment and Plan: Brett [name] is a 65 y.o. 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 Current issues: ?#HTN, likely primary- will start chlorthalidone 25 mg po daily- f/u BMP (crea, lytes)- has BP machine, f/u BP log?#Gout, asymptomatic today- reports having few gout attacks per year- continue colchicine - last uric acid level 9.7 on 05/2019?#2nd degree AV block (2:1) / RBBB- incidental finding 05/2019 of bradycardia while admitted for gout flare- found to have 2:1 AV heart block with mild QRS prolongation and RBBB- underwent bipolar pacemaker on 05/22/19- Lyme antibodies negative- follows with [facility] in [place]?# EtOH/Smoking- current smoker, for the past 50 years. Has recently cut down to 5 cigarettes per day, down from 1 PPD- drinks a 6 pack of beer daily, denies h/o withdrawal- denies drug use, no marijuana- will start nicotine patches and gums # HCM- due for PPSV, Tdap, Shingles- due for FIT test- f/u LDCT for lung cancer screening                                                        LDCT Lung Cancer Screening Eligibility and Shared Decision discussionThis patient has participated in a shared decision making session (using shared decision making aid) during which potential risks and benefits of [place] lung screening were explained including the possibility of finding abnormalities other than cancer that may require additional testing and/or procedures.?	The patient was informed that low dose lung screening [place] scans expose patients to about ? of the radiation of a normal [place] scan.?	The patient was informed of the importance of adherence to annual screening, impact of comorbidities, and ability/willingness to undergo treatment.?	The patient was informed of the importance of smoking cessation and/or maintaining smoking abstinence.?	Patient has at least a 30 pack year smoking history?	The patient has no signs or symptoms of lung cancer (such as fever, chest pain, new shortness of breath, new or changing cough, coughing up blood, or unexplained significant weight loss).Follow-Up: Return in about 10 days (around 03/18/2020).Case discussed with Dr. Waunita Schooner, attending addendum to follow. Signed:Sueellen Kayes Governor Rooks, MD, PGY-3

## 2020-03-12 ENCOUNTER — Encounter: Admit: 2020-03-12 | Payer: PRIVATE HEALTH INSURANCE | Attending: Family | Primary: Internal Medicine

## 2020-03-12 DIAGNOSIS — Z01818 Encounter for other preprocedural examination: Secondary | ICD-10-CM

## 2020-03-16 ENCOUNTER — Inpatient Hospital Stay: Admit: 2020-03-16 | Discharge: 2020-03-16 | Payer: PRIVATE HEALTH INSURANCE | Primary: Internal Medicine

## 2020-03-16 DIAGNOSIS — Z20822 Contact with and (suspected) exposure to covid-19: Secondary | ICD-10-CM

## 2020-03-16 DIAGNOSIS — Z01812 Encounter for preprocedural laboratory examination: Secondary | ICD-10-CM

## 2020-03-16 DIAGNOSIS — Z01818 Encounter for other preprocedural examination: Secondary | ICD-10-CM

## 2020-03-17 ENCOUNTER — Encounter: Admit: 2020-03-17 | Payer: PRIVATE HEALTH INSURANCE | Primary: Internal Medicine

## 2020-03-17 ENCOUNTER — Ambulatory Visit: Admit: 2020-03-17 | Payer: BLUE CROSS/BLUE SHIELD | Primary: Internal Medicine

## 2020-03-17 DIAGNOSIS — M109 Gout, unspecified: Secondary | ICD-10-CM

## 2020-03-17 DIAGNOSIS — I441 Atrioventricular block, second degree: Secondary | ICD-10-CM

## 2020-03-17 MED ORDER — ALLOPURINOL 100 MG TABLET
100 mg | ORAL_TABLET | Freq: Every day | ORAL | 4 refills | Status: AC
Start: 2020-03-17 — End: 2020-05-19

## 2020-03-17 MED ORDER — PREDNISONE 5 MG TABLET
5 mg | ORAL_TABLET | Freq: Two times a day (BID) | ORAL | 1 refills | Status: AC
Start: 2020-03-17 — End: ?

## 2020-03-17 MED ORDER — COLCHICINE 0.6 MG TABLET
0.6 mg | ORAL_TABLET | Freq: Two times a day (BID) | ORAL | 3 refills | Status: AC
Start: 2020-03-17 — End: 2020-04-19

## 2020-03-17 MED ORDER — PREDNISONE 5 MG TABLET
5 mg | ORAL_TABLET | Freq: Every day | ORAL | 1 refills | Status: AC
Start: 2020-03-17 — End: ?

## 2020-03-17 NOTE — Patient Instructions
1. Start colchicine 0.6 mg BID (twice a day)2. Start prednisone 5 mg BID for 2 weeks if no attacks then reduce to 5 mg daily3. On day 5 start allopurinol 100 mg weekly for 2 weeks then increase to 200 mg

## 2020-03-18 ENCOUNTER — Ambulatory Visit: Admit: 2020-03-18 | Payer: BLUE CROSS/BLUE SHIELD | Attending: Anesthesiology | Primary: Internal Medicine

## 2020-03-18 DIAGNOSIS — K4030 Unilateral inguinal hernia, with obstruction, without gangrene, not specified as recurrent: Secondary | ICD-10-CM

## 2020-03-18 NOTE — Other
PERI-OP CHART CHECKLIST Date Document Location CONSENT 11/22 DOS []  Media [x]    H&P  DOS  [x]  Notes []  CE []  EKG 07/28/19 EKG [x]  Media []  CE  []  LABS 12/20lytes Labs [x]  Media []  CE []  MRSA  Neg []  Pos []  CE []  MED  NOTE 2/7 Notes [x]  Media []  CE []  CARD CL/ NOTE  Notes []  Media []  CE []  CXR  Imaging []  Media []  CE []  PATA anesth  [place] []  Other provider []    ICD or PACER(See notes) Boston ScientificFaxed form ICD []  Pacer [x]  EP  notified [x]  Notes:

## 2020-03-19 ENCOUNTER — Encounter: Admit: 2020-03-19 | Payer: PRIVATE HEALTH INSURANCE | Attending: Surgical Critical Care | Primary: Internal Medicine

## 2020-03-19 ENCOUNTER — Inpatient Hospital Stay: Admit: 2020-03-19 | Discharge: 2020-03-19 | Payer: BLUE CROSS/BLUE SHIELD | Primary: Internal Medicine

## 2020-03-19 ENCOUNTER — Ambulatory Visit: Admit: 2020-03-19 | Payer: PRIVATE HEALTH INSURANCE | Attending: Anesthesiology | Primary: Internal Medicine

## 2020-03-19 DIAGNOSIS — K4030 Unilateral inguinal hernia, with obstruction, without gangrene, not specified as recurrent: Secondary | ICD-10-CM

## 2020-03-19 DIAGNOSIS — J449 Chronic obstructive pulmonary disease, unspecified: Secondary | ICD-10-CM

## 2020-03-19 DIAGNOSIS — F17210 Nicotine dependence, cigarettes, uncomplicated: Secondary | ICD-10-CM

## 2020-03-19 DIAGNOSIS — I10 Essential (primary) hypertension: Secondary | ICD-10-CM

## 2020-03-19 DIAGNOSIS — K4090 Unilateral inguinal hernia, without obstruction or gangrene, not specified as recurrent: Secondary | ICD-10-CM

## 2020-03-19 DIAGNOSIS — Z79899 Other long term (current) drug therapy: Secondary | ICD-10-CM

## 2020-03-19 DIAGNOSIS — F17219 Nicotine dependence, cigarettes, with unspecified nicotine-induced disorders: Secondary | ICD-10-CM

## 2020-03-19 DIAGNOSIS — I441 Atrioventricular block, second degree: Secondary | ICD-10-CM

## 2020-03-19 DIAGNOSIS — M109 Gout, unspecified: Secondary | ICD-10-CM

## 2020-03-19 MED ORDER — PROPOFOL 10 MG/ML INTRAVENOUS EMULSION
10 mg/mL | Status: CP
Start: 2020-03-19 — End: ?

## 2020-03-19 MED ORDER — GABAPENTIN 300 MG CAPSULE
300 mg | Freq: Once | ORAL | Status: CP
Start: 2020-03-19 — End: ?
  Administered 2020-03-19: 14:00:00 300 mg via ORAL

## 2020-03-19 MED ORDER — LACTATED RINGERS INTRAVENOUS SOLUTION
INTRAVENOUS | Status: DC | PRN
Start: 2020-03-19 — End: 2020-03-19
  Administered 2020-03-19: 14:00:00 via INTRAVENOUS

## 2020-03-19 MED ORDER — KETOROLAC 30 MG/ML (1 ML) INJECTION SOLUTION
30 mg/mL (1 mL) | Status: CP
Start: 2020-03-19 — End: ?

## 2020-03-19 MED ORDER — HEPARIN (PORCINE) 5,000 UNIT/ML INJECTION SOLUTION
5000 unit/mL | Status: CP
Start: 2020-03-19 — End: ?

## 2020-03-19 MED ORDER — SUGAMMADEX 100 MG/ML INTRAVENOUS SOLUTION
100 mg/mL | Status: CP
Start: 2020-03-19 — End: ?

## 2020-03-19 MED ORDER — LIDOCAINE (PF) 20 MG/ML (2 %) INTRAVENOUS SOLUTION
20 mg/mL (2 %) | Status: CP
Start: 2020-03-19 — End: ?

## 2020-03-19 MED ORDER — GABAPENTIN 300 MG CAPSULE
300 mg | Status: CP
Start: 2020-03-19 — End: ?

## 2020-03-19 MED ORDER — SUGAMMADEX 100 MG/ML INTRAVENOUS SOLUTION
100 mg/mL | INTRAVENOUS | Status: DC | PRN
Start: 2020-03-19 — End: 2020-03-19
  Administered 2020-03-19: 17:00:00 100 mg/mL via INTRAVENOUS

## 2020-03-19 MED ORDER — MIDAZOLAM 1 MG/ML INJECTION SOLUTION
1 mg/mL | Status: CP
Start: 2020-03-19 — End: ?

## 2020-03-19 MED ORDER — ACETAMINOPHEN 500 MG TABLET
500 mg | Freq: Once | ORAL | Status: CP
Start: 2020-03-19 — End: ?
  Administered 2020-03-19: 14:00:00 500 mg via ORAL

## 2020-03-19 MED ORDER — CEFAZOLIN 1 GRAM SOLUTION FOR INJECTION
1 gram | INTRAVENOUS | Status: DC | PRN
Start: 2020-03-19 — End: 2020-03-19
  Administered 2020-03-19: 15:00:00 1 gram via INTRAVENOUS

## 2020-03-19 MED ORDER — FENTANYL (PF) 50 MCG/ML INJECTION SOLUTION
50 mcg/mL | INTRAVENOUS | Status: DC | PRN
Start: 2020-03-19 — End: 2020-03-19

## 2020-03-19 MED ORDER — MIDAZOLAM (PF) 1 MG/ML INJECTION SOLUTION
1 mg/mL | INTRAVENOUS | Status: DC | PRN
Start: 2020-03-19 — End: 2020-03-19
  Administered 2020-03-19: 15:00:00 1 mg/mL via INTRAVENOUS

## 2020-03-19 MED ORDER — SODIUM CHLORIDE 0.9 % (FLUSH) INJECTION SYRINGE
0.9 % | INTRAVENOUS | Status: DC | PRN
Start: 2020-03-19 — End: 2020-03-19

## 2020-03-19 MED ORDER — CEFAZOLIN IV PUSH 1 GRAM VIAL & 0.9% SODIUM CHLORIDE (ADULT)
Freq: Once | INTRAVENOUS | Status: DC
Start: 2020-03-19 — End: 2020-03-19

## 2020-03-19 MED ORDER — SODIUM CHLORIDE 0.9 % (FLUSH) INJECTION SYRINGE
0.9 % | Freq: Three times a day (TID) | INTRAVENOUS | Status: DC
Start: 2020-03-19 — End: 2020-03-19

## 2020-03-19 MED ORDER — CELECOXIB 200 MG CAPSULE
200 mg | Freq: Once | ORAL | Status: CP
Start: 2020-03-19 — End: ?
  Administered 2020-03-19: 14:00:00 200 mg via ORAL

## 2020-03-19 MED ORDER — BUPIVACAINE (PF) 0.25 % (2.5 MG/ML) INJECTION SOLUTION
0.25 % (2.5 mg/mL) | Status: CP
Start: 2020-03-19 — End: ?

## 2020-03-19 MED ORDER — DEXAMETHASONE SODIUM PHOSPHATE (PF) 10 MG/ML INJECTION SOLUTION
10 mg/mL | Status: CP
Start: 2020-03-19 — End: ?

## 2020-03-19 MED ORDER — PHENYLEPHRINE 1 MG/10 ML (100 MCG/ML) IN 0.9 % SOD.CHLORIDE IV SYRINGE
1 mg/0 mL (00 mcg/mL) | INTRAVENOUS | Status: DC | PRN
Start: 2020-03-19 — End: 2020-03-19
  Administered 2020-03-19 (×4): 1 mg/0 mL (00 mcg/mL) via INTRAVENOUS

## 2020-03-19 MED ORDER — OXYCODONE IMMEDIATE RELEASE 5 MG TABLET
5 mg | ORAL | Status: DC | PRN
Start: 2020-03-19 — End: 2020-03-19

## 2020-03-19 MED ORDER — ROCURONIUM 10 MG/ML INTRAVENOUS SOLUTION
10 mg/mL | INTRAVENOUS | Status: DC | PRN
Start: 2020-03-19 — End: 2020-03-19
  Administered 2020-03-19: 15:00:00 10 mg/mL via INTRAVENOUS

## 2020-03-19 MED ORDER — ACETAMINOPHEN 500 MG TABLET
500 mg | Status: CP
Start: 2020-03-19 — End: ?

## 2020-03-19 MED ORDER — PHENYLEPHRINE 1 MG/10 ML (100 MCG/ML) IN 0.9 % SOD.CHLORIDE IV SYRINGE
1 mg/0 mL (00 mcg/mL) | Status: CP
Start: 2020-03-19 — End: ?

## 2020-03-19 MED ORDER — CHLORHEXIDINE GLUCONATE 0.12 % MOUTHWASH
0.12 % | Freq: Once | OROMUCOSAL | Status: CP
Start: 2020-03-19 — End: ?
  Administered 2020-03-19: 14:00:00 0.12 mL via OROMUCOSAL

## 2020-03-19 MED ORDER — LIDOCAINE (PF) 10 MG/ML (1 %) INJECTION SOLUTION
10 mg/mL (1 %) | Freq: Once | INTRADERMAL | Status: CP
Start: 2020-03-19 — End: ?
  Administered 2020-03-19: 14:00:00 10 mL via INTRADERMAL

## 2020-03-19 MED ORDER — PROPOFOL 10 MG/ML INTRAVENOUS EMULSION
10 mg/mL | INTRAVENOUS | Status: DC | PRN
Start: 2020-03-19 — End: 2020-03-19
  Administered 2020-03-19 (×2): 10 mg/mL via INTRAVENOUS

## 2020-03-19 MED ORDER — OXYCODONE IMMEDIATE RELEASE 5 MG TABLET
5 mg | ORAL_TABLET | Freq: Four times a day (QID) | ORAL | 1 refills | Status: AC | PRN
Start: 2020-03-19 — End: 2020-04-19

## 2020-03-19 MED ORDER — HEPARIN (PORCINE) 5,000 UNIT/ML INJECTION SOLUTION
5000 unit/mL | Freq: Once | SUBCUTANEOUS | Status: CP
Start: 2020-03-19 — End: ?
  Administered 2020-03-19: 14:00:00 via SUBCUTANEOUS

## 2020-03-19 MED ORDER — CHLORHEXIDINE GLUCONATE 0.12 % MOUTHWASH
0.12 % | Status: CP
Start: 2020-03-19 — End: ?

## 2020-03-19 MED ORDER — SUCCINYLCHOLINE CHLORIDE 100 MG/5 ML (20 MG/ML) INTRAVENOUS SYRINGE
100 mg/5 mL (20 mg/mL) | Status: CP
Start: 2020-03-19 — End: ?

## 2020-03-19 MED ORDER — CELECOXIB 200 MG CAPSULE
200 mg | Status: CP
Start: 2020-03-19 — End: ?

## 2020-03-19 MED ORDER — BUPIVACAINE (PF) 0.25 % (2.5 MG/ML) INJECTION SOLUTION
0.25 % (2.5 mg/mL) | Status: DC | PRN
Start: 2020-03-19 — End: 2020-03-19
  Administered 2020-03-19: 17:00:00 0.25 % (2.5 mg/mL)

## 2020-03-19 MED ORDER — DEXAMETHASONE SODIUM PHOSPHATE (PF) 10 MG/ML INJECTION SOLUTION
10 mg/mL | INTRAVENOUS | Status: DC | PRN
Start: 2020-03-19 — End: 2020-03-19
  Administered 2020-03-19: 15:00:00 10 mg/mL via INTRAVENOUS

## 2020-03-19 MED ORDER — FENTANYL (PF) 50 MCG/ML INJECTION SOLUTION
50 mcg/mL | Status: CP
Start: 2020-03-19 — End: ?

## 2020-03-19 MED ORDER — ROCURONIUM 10 MG/ML INTRAVENOUS SOLUTION
10 mg/mL | Status: CP
Start: 2020-03-19 — End: ?

## 2020-03-19 MED ORDER — KETOROLAC 30 MG/ML (1 ML) INJECTION SOLUTION
30 mg/mL (1 mL) | INTRAVENOUS | Status: DC | PRN
Start: 2020-03-19 — End: 2020-03-19
  Administered 2020-03-19: 17:00:00 30 mg/mL (1 mL) via INTRAVENOUS

## 2020-03-19 MED ORDER — SENNOSIDES 8.6 MG-DOCUSATE SODIUM 50 MG TABLET
ORAL_TABLET | Freq: Every day | ORAL | 1 refills | Status: AC
Start: 2020-03-19 — End: 2020-04-19

## 2020-03-19 MED ORDER — FENTANYL (PF) 50 MCG/ML INJECTION SOLUTION
50 mcg/mL | INTRAVENOUS | Status: DC | PRN
Start: 2020-03-19 — End: 2020-03-19
  Administered 2020-03-19 (×3): 50 mcg/mL via INTRAVENOUS

## 2020-03-19 MED ORDER — ONDANSETRON HCL (PF) 4 MG/2 ML INJECTION SOLUTION
4 mg/2 mL | Status: CP
Start: 2020-03-19 — End: ?

## 2020-03-19 MED ORDER — LIDOCAINE (PF) 20 MG/ML (2 %) INJECTION SOLUTION
20 mg/mL (2 %) | INTRAVENOUS | Status: DC | PRN
Start: 2020-03-19 — End: 2020-03-19
  Administered 2020-03-19: 15:00:00 20 mg/mL (2 %) via INTRAVENOUS

## 2020-03-19 MED ORDER — ONDANSETRON HCL (PF) 4 MG/2 ML INJECTION SOLUTION
4 mg/2 mL | INTRAVENOUS | Status: DC | PRN
Start: 2020-03-19 — End: 2020-03-19
  Administered 2020-03-19: 17:00:00 4 mg/2 mL via INTRAVENOUS

## 2020-03-19 MED ORDER — ONDANSETRON HCL (PF) 4 MG/2 ML INJECTION SOLUTION
42 mg/2 mL | INTRAVENOUS | Status: DC | PRN
Start: 2020-03-19 — End: 2020-03-19

## 2020-03-19 MED ORDER — PROPOFOL 10 MG/ML INTRAVENOUS EMULSION
10 mg/mL | INTRAVENOUS | Status: DC | PRN
Start: 2020-03-19 — End: 2020-03-19
  Administered 2020-03-19: 15:00:00 10 mL/h via INTRAVENOUS

## 2020-03-19 MED ORDER — LACTATED RINGERS INTRAVENOUS SOLUTION
INTRAVENOUS | Status: DC
Start: 2020-03-19 — End: 2020-03-19
  Administered 2020-03-19: 14:00:00 1000.000 mL/h via INTRAVENOUS

## 2020-03-19 NOTE — Brief Op Note
[id number] [facility] HealthPatient Name: Denilson Salminen        [id number] Patient DOB: 08/26/1955     Surgery Date: 2/18/2022Surgeon(s) and Role:   * Cholewczynski, Denton Brick, MD - PrimaryAssistant(s):Resident: Lavell Anchors, MDStaff:  Circulator: Fayrene Fearing, RN; Gemma Payor, RNRelief Circulator: Carlos Levering, RNRelief Scrub: Wesley Desanctis, FerideScrub Person: Abelina Bachelor, Astride; Williams, IanPre-Op Diagnosis: * No pre-op diagnosis entered * Procedure(s) and Anesthesia Type:   * LEFT INGUINAL HERNIA REPAIR - GENERALOperative Findings (enter relevant operative findings; do not refer to an operative report that is not yet transcribed): large lipoma of the cord and small indirect inguinal herniaSigns of infection present at the time of surgery at the operative site: None Blood and Blood Products: none                 Drains:  noneImplants: Implant Name Type Inv. Item Serial No. Manufacturer Lot No. LRB No. Used Action PLUG MESH PREFIX LARGE - [id number] Implant PLUG MESH PREFIX LARGE  CR BARD [id number] Left 1 Implanted  Specimens: * No specimens in log * Clinical Staging: naEBL: 25 mL       Post Operative Diagnosis: * No post-op diagnosis entered * Lavell Anchors, MD2/18/202212:34 PM

## 2020-03-19 NOTE — Other
Post Anesthesia Transfer of Care NotePatient: Brett Riding SladeProcedure(s) Performed: Procedure(s) (LRB):LEFT INGUINAL HERNIA REPAIR (Left) Patient location: PACU Last Vitals: Vitals Value Taken Time BP 130/80 03/19/20 1250 Temp 36.2 ?C 03/19/20 1248 Pulse 73 03/19/20 1251 Resp 16 03/19/20 1251 SpO2 100 % 03/19/20 1251 Vitals shown include unvalidated device data.Level of consciousness: sedated and responds to stimulationTransport Vital Signs:  Stable since the last set of recorded intra-operative vital signsIntra-operative Complications: noneIntra-operative Intake & Output and Antibiotics as per Anesthesia record and discussed with the RN.

## 2020-03-19 NOTE — Other
Surgery H&PSubjective:  Brett [name] is a 65 y.o. male with a history of right inguinal hernia repair presenting with a left inguinal hernia that he would like repaired. He has noticed it for about a year and says it has been reducible.Of note he has a history of 2nd degree AVB s/p PPM. He also has a history of gout. He was prescribed a course of prednisone but has not started this.Brett [name]  has a past medical history of 2nd degree AV block and Gout.He  has a past surgical history that includes Pacemaker insertion.He  reports that he has been smoking cigarettes. He has a 50.00 pack-year smoking history. He has never used smokeless tobacco. He reports current alcohol use of about 42.0 standard drinks of alcohol per week. He reports previous drug use.He has No Known Allergies.Medications: Current Facility-Administered Medications Medication ? ceFAZolin (ANCEF) 2 g in sodium chloride 0.9 % 20 mL (100 mg/mL) ? fentaNYL PF (SUBLIMAZE) injection 25 mcg ? fentaNYL PF (SUBLIMAZE) injection 50 mcg ? fentaNYL PF (SUBLIMAZE) injection 75 mcg ? lactated Ringers infusion ? ondansetron (PF) (ZOFRAN) injection 4 mg ? oxyCODONE (ROXICODONE) Immediate Release tablet 5 mg ? sodium chloride 0.9 % flush 3 mL ? sodium chloride 0.9 % flush 3 mL Objective: BP 121/74  - Pulse 73  - Temp 98.2 ?F (36.8 ?C) (Oral)  - Resp 18  - Ht 6' 1 (1.854 m)  - Wt 87.2 kg  - SpO2 99%  - BMI 25.36 kg/m? General appearance: NADCV: RRRResp: CTABAbdomen: soft NTND Assessment/Plan: Brett [name] is a 65 y.o. male with a left inguinal hernia, here for elective repair.- To OR for open left inguinal hernia repair- Consent in chart

## 2020-03-19 NOTE — Discharge Instructions
Discharge InstructionsWound/Dressings:-The dressing may come off after 48 hours-You also have surgical skin glue over your incision - this will flake off on its own Medications:-Continue your normal home medications-Take prescribed pain medications as needed. For mild pain, you may also take Tylenol and ibuprofen. Do not take more than 4000 mg of Tylenol per 24 hours.-While taking narcotic pain medications, take a stool softener as instructed. Stop taking the stool softener if having diarrhea.Shower:-You may shower normally in 48 hours after the dressings come off, please do not scrub your incisions, you can allow water to flow over them-Do not immerse in water (no baths, swimming, hot tub, etc) until advised by your surgeonDiet:-You may resume your regular dietActivity:-You may return to normal activity as tolerated-No driving or operating heavy machinery while taking narcotic pain medications-No strenuous exercise, bending, lifting, pushing, pulling greater than 10 lbs for 6 weeks -Check with your physician at your follow up appointment for more detailsFollow up:-Please schedule a follow up appointment as instructedOther instructions:-Please call your doctor if you experience fever > 101 F, chills; excessive redness, swelling, or purulent discharge from your incision; uncontrollable nausea or vomiting, severe abdominal pain; or if you have any other questions or concerns

## 2020-03-19 NOTE — Other
Operative Diagnosis:Pre-op:   * No pre-op diagnosis entered * Patient Coded Diagnosis   Pre-op diagnosis: Unilateral inguinal hernia with obstruction and without gangrene, recurrence not specified  Post-op diagnosis: Unilateral inguinal hernia with obstruction and without gangrene, recurrence not specified  Patient Diagnosis   None    Post-op diagnosis:   * Unilateral inguinal hernia with obstruction and without gangrene, recurrence not specified [K40.30]Operative Procedure(s) :Procedure(s) (LRB):LEFT INGUINAL HERNIA REPAIR (Left)Post-op Procedure & Diagnosis ConfirmationPost-op Diagnosis: Post-op Diagnosis updated (see notes)     - Unilateral inguinal hernia with obstruction and without gangrene, recurrence not specified (K40.30)Post-op Procedure: Post-op Procedure updated (see notes)     - LEFT INGUINAL HERNIA REPAIR

## 2020-03-19 NOTE — Other
Post Anesthesia Transfer of Care NotePatient: Brett Riding SladeProcedure(s) Performed: Procedure(s) (LRB):LEFT INGUINAL HERNIA REPAIR (Left) Patient location: PACU Last Vitals: Vitals Value Taken Time BP 130/80 03/19/20 1250 Temp 36.2 ?C 03/19/20 1248 Pulse 77 03/19/20 1252 Resp 22 03/19/20 1252 SpO2 100 % 03/19/20 1252 Vitals shown include unvalidated device data.Level of consciousness: awake, alert  and orientedTransport Vital Signs:  Stable since the last set of recorded intra-operative vital signsIntra-operative Complications: noneIntra-operative Intake & Output and Antibiotics as per Anesthesia record and discussed with the RN.

## 2020-03-19 NOTE — Anesthesia Post-Procedure Evaluation
Anesthesia Post-op NotePatient: Brett Wayne SladeProcedure(s):  Procedure(s) (LRB):LEFT INGUINAL HERNIA REPAIR (Left) Patient location: PACULast Vitals:  I have noted the vital signs as listed in the nursing notes.Mental status recovered: patient participates in evaluation: YesVital signs reviewed: YesRespiratory function stable:YesAirway is patent: YesCardiovascular function and hydration status stable: YesPain control satisfactory: YesNausea and vomiting control satisfactory:YesNo complications documented.

## 2020-03-19 NOTE — Other
Pt arrived to pacu s/p left inguinal hernia repair. Dressing c/d/i. Pt denies any pain. VSS. Discharge instructions reviewed, pt verbalized understanding. Pt ambulated to bathroom to void without difficulty.

## 2020-03-19 NOTE — Anesthesia Pre-Procedure Evaluation
This is a 65 y.o. male scheduled for LEFT INGUINAL HERNIA REPAIR (Left Groin).Review of Systems/ Medical HistoryPatient summary, nursing notes, EKG/Cardiac Studies , Labs, pre-procedure vitals, height, weight and NPO status reviewed.No previous anesthesia concernsAnesthesia Evaluation:   No history of anesthetic complications  Estimated body mass index is 25.36 kg/m? as calculated from the following:  Height as of this encounter: 6' 1 (1.854 m).  Weight as of this encounter: 87.2 kg. Cardiovascular: Negative   Patient has a history of: hypertension. -Exercise tolerance: >4 METS -Dysrhythmia(s): yes: atrial and nodal-Device(s): pacemaker-Vascular Disease:  Negative   Respiratory:  Negative.-Lung Disorders: -COPD:  yes, mild COPD.-Nicotine Dependence: yes, smoking HEENT: Negative.Neuromuscular: NegativeSkeletal/Skin:  NegativeGastrointestinal/Genitourinary:  Negative -Nutritional Disorders: Patient has has increased body weight- obesity.Hematological/Lymphatic: NegativeEndocrine/Metabolic:  Negative.Behavioral/Psychiatric & Syndromes: NegativePhysical ExamCardiovascular:    Rhythm: regularHeart Sounds: S1 present and S2 present.Pulmonary:  normal exam  decreased breath soundsPatient's breath sounds clear to auscultationAirway:  Mallampati: IITM distance: >3 FBNeck ROM: fullDental:  normal exam  Dentition: missing and chippedAnesthesia PlanASA 3 The primary anesthesia plan is  general LMA. Perioperative Code Status confirmed: It is my understanding that the patient is currently designated as 'Full Code' and will remain so throughout the perioperative period.Anesthesia informed consent obtained. Consent obtained from: patientUse of blood products: consented  The post operative pain plan is per surgeon management.Plan discussed with Attending and SRNA.Anesthesiologist's Pre Op NoteI personally evaluated and examined the patient prior to the intra-operative phase of care.

## 2020-03-20 NOTE — Other
[facility] REPORTCSN:  [id number] PATIENT:  Brett [name] DATE:  02/18/2022PROCEDURE DATE:  02/18/2022D:  03/19/2020 12:17:18    Dictated by:  Earlie Server, M.D.T:  03/19/2020 12:58:56    [id number] DATE OF PROCEDURE/SURGERY:  02/18/2022OPERATION:Left open inguinal herniorrhaphy with mesh.OPERATOR:Katrine Radich, M.D.ASSISTANT:Shirley Verdie Mosher, M.D.ANESTHESIOLOGIST:ANESTHESIA:General.PREOP DIAGNOSIS:left inguinal hernia.POSTOP DIAGNOSIS:Left indirect inguinal hernia.ESTIMATED BLOOD LOSS:N/ASPECIMENS REMOVED:N/APROCEDURE:  Patient was placed on the operating room table in supine position.  Patient's left lower abdomen and inguinal region were prepped and draped in usual sterile fashion.  The pubic tubercle on the left and the left anterior superior iliac spine were marked for orientation.  A left inguinal skin crease incision was made and the subcutaneous tissues were divided.  The superficial epigastric vessels were encountered and separated from surrounding tissue, clamped, divided, and ligated with 3-0 Vicryl ligatures.  The remainder of subcutaneous tissues divided down to Scarpa's fascia.  Scarpa's fascia was opened and then dissected down to the aponeurosis external oblique.  The aponeurosis was cleared of the surrounding tissue.  The superficial ring was identified.  A 15 blade knife was used to make an incision in the aponeurosis and the opening was enlarged with a Metzenbaum scissor.  The edges of the aponeurosis were picked up with Crile clamps and the ilioinguinal nerve was identified and separated and dissected free from the surrounding tissue and that was protected over the medial side of the aponeurosis.  The spermatic cord was then encircled at the level of the pubic tubercle and a Penrose drain was placed around it. The hernia sac was identified in the anteromedial aspect superficial and the spermatic cord.  The sac was opened and then extended. At all times, the vas deferens was palpated and the path was identified and it was away from the dissection area.  A high ligation of the hernia sac was performed using a 3-0 Vicryl pursestring suture at the base of the hernia sac.  Then, a Bard PerFix plug and patch kit, large size, was obtained.  A large plug was then passed into the defect under the internal oblique muscle and it was fixed in place with a 3-0 Vicryl suture, passing the suture through the undersurface of the internal oblique into an inner leaf of the of the plug and then another stitch from the floor of the inguinal canal through an inner leaf of the plug holding it in place.  Then, a flat mesh was then sewn into the floor of the inguinal canal using a 2-0 Prolene suture.  The suture was begun on the conjoined tendon and run down to the pubic tubercle and then the patch and the suture were passed under the spermatic cord to the lateral side and the suture was placed running along the shelving edge of the inguinal ligament up to the lateral aspect and then tied off on the medial side.  Several interrupted 2-0 Prolene sutures were placed to fix the medial side of the patch to the conjoined tendon and then the tails were crossed over superior to the spermatic cord and the tails were then sutured and fixed to the internal oblique muscle.  The ring now created around the spermatic cord was tightened up with an additional 2-0 Prolene suture and this provided the appropriate size for the ring around the spermatic cord.  Bupivacaine 0.25% was then injected along the origin of the ilioinguinal nerve along and into the suture sites and also as an ilioinguinal nerve block just medial to the anterior superior iliac spine.  The Penrose  drain was removed.  The aponeurosis external oblique opening was then closed with a running 3-0 Vicryl suture.  Scarpa's fascia was closed with interrupted 3-0 Vicryl sutures.  Inverted dermal 3-0 Vicryl sutures were placed to close the dermis and the skin was closed with 4-0 Monocryl subcuticular closure.  Additional bupivacaine was injected subcutaneously.  The skin was cleansed and dried.  Dermabond skin adhesive was applied, sealing the skin closure, and a Telfa and Tegaderm dressing were applied.Job #:  [id number]

## 2020-03-29 ENCOUNTER — Ambulatory Visit
Admit: 2020-03-29 | Payer: PRIVATE HEALTH INSURANCE | Attending: Student in an Organized Health Care Education/Training Program | Primary: Internal Medicine

## 2020-03-29 ENCOUNTER — Encounter
Admit: 2020-03-29 | Payer: PRIVATE HEALTH INSURANCE | Attending: Student in an Organized Health Care Education/Training Program | Primary: Internal Medicine

## 2020-03-29 DIAGNOSIS — I441 Atrioventricular block, second degree: Secondary | ICD-10-CM

## 2020-03-29 DIAGNOSIS — M109 Gout, unspecified: Secondary | ICD-10-CM

## 2020-03-29 DIAGNOSIS — K4090 Unilateral inguinal hernia, without obstruction or gangrene, not specified as recurrent: Secondary | ICD-10-CM

## 2020-03-29 NOTE — Progress Notes
[facility] Wynn Remmy Riffe is a 65 y.o. male who underwent open L inguinal hernia repair on 03/19/20. Recovering well from surgery, no pain, denies N/V, F/C, normal eating and drinking, normal urinary/bowel habits. Reports swelling is decreasing from incision. Some stiffness with movement but improving. 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 He has a 50.00 pack-year smoking history. He has never used smokeless tobacco. He reports current alcohol use of about 42.0 standard drinks of alcohol per week. He reports previous drug use.Allergies:He has No Known Allergies.Medications:He has a current medication list which includes the following prescription(s): allopurinol, chlorthalidone, colchicine, blood pressure cuff, nicotine, nicotine polacrilex, oxycodone, pantoprazole, prednisone, [START ON 04/01/2020] prednisone, and senna-docusate.Physical Exam:BP (!) 159/84  - Pulse (!) 97  - Temp 97.2 ?F (36.2 ?C)  - Ht 6' 1 (1.854 m)  - Wt 88 kg  - BMI 25.58 kg/m? GEN: no apparent distressNEURO: No focal deficitsHEENT: Normocephalic, atraumaticCV: regular rate and rhythmPULM: Nonlabored breathing on RAGI: soft, nontender, nondistendedGU: Surgical incision over L groin well healed, c/d/iImaging:No results found.Assessment/Plan: Jonan Seufert is a 65 y.o. male s/p open L inguinal hernia repair, recovering well. Back to normal daily activities. Incision healing well, no issues. Instructed to refrain from lifting > 10 lbs for 6 weeks.RTC PRNDustin K. Polo Riley, MDCardiothoracic Surgery PGY-01/26/2021

## 2020-03-30 ENCOUNTER — Encounter: Admit: 2020-03-30 | Payer: PRIVATE HEALTH INSURANCE | Attending: Surgical Critical Care | Primary: Internal Medicine

## 2020-04-01 ENCOUNTER — Encounter
Admit: 2020-04-01 | Payer: PRIVATE HEALTH INSURANCE | Attending: Student in an Organized Health Care Education/Training Program | Primary: Internal Medicine

## 2020-04-19 ENCOUNTER — Encounter
Admit: 2020-04-19 | Payer: PRIVATE HEALTH INSURANCE | Attending: Student in an Organized Health Care Education/Training Program | Primary: Internal Medicine

## 2020-04-19 ENCOUNTER — Ambulatory Visit
Admit: 2020-04-19 | Payer: PRIVATE HEALTH INSURANCE | Attending: Student in an Organized Health Care Education/Training Program | Primary: Internal Medicine

## 2020-04-19 DIAGNOSIS — I441 Atrioventricular block, second degree: Secondary | ICD-10-CM

## 2020-04-19 DIAGNOSIS — M109 Gout, unspecified: Secondary | ICD-10-CM

## 2020-04-19 DIAGNOSIS — I10 Essential (primary) hypertension: Secondary | ICD-10-CM

## 2020-04-19 DIAGNOSIS — N529 Male erectile dysfunction, unspecified: Secondary | ICD-10-CM

## 2020-04-19 DIAGNOSIS — Z0000 Encounter for general adult medical examination without abnormal findings: Secondary | ICD-10-CM

## 2020-04-19 MED ORDER — TADALAFIL 10 MG TABLET
10 mg | ORAL_TABLET | Freq: Every day | ORAL | 2 refills | Status: AC | PRN
Start: 2020-04-19 — End: ?

## 2020-04-19 MED ORDER — PEG 3350-ELECTROLYTES 236 GRAM-22.74 GRAM-6.74 GRAM-5.86 GRAM SOLUTION
Freq: Once | ORAL | 1 refills | Status: AC
Start: 2020-04-19 — End: ?

## 2020-04-19 NOTE — Patient Instructions
-   After 2 weeks of taking Allopurinol 167m daily, increase your dose to 100mg  twice a dayDate of Colonoscopy:  May 23rd at 1:00 PMOne Week Before a Colonoscopy:You will need to find an adult to come to your appointment with you and agree to accompany you home.  This is very important since you will be given medicine during your colonoscopy that makes you sleepy.  If another adult does not check-in with you, your appointment will be cancelled and rescheduled.  You will NOT be able to drive a car or go home alone even by bus, taxi or other transportation. Stop taking any iron or fiber supplements.  Get your bowel prep from the pharmacy so you are ready for your appointment.Please refer below for medication and bowel prep instructions. Three Days Before a Colonoscopy: Call the adult who will be accompanying you home on the day of your appointment to make sure they are still available.Stop eating seeds, popcorn, nuts, whole grains, fruits, tomatoes, and green vegetables.One Day Before a Colonoscopy:Start a clear liquid diet.  You may NOT eat solid foods until after your appointment.  Some examples of what you can eat NFA:OZHYQ		Clear juice (no citrus, like orange)Tea and coffee (no milk)	Hard candies (e.g. Lifesavers)Clear broth Soda, Gatorade, Kool-Aid, Jell-O, [place] Ices, Popsicles (no red or purple colors)Medications:Patient is not currently taking Anti-thrombotic medication.Patient is not currently taking diabetic medication.Bowel Prep Patient Instructions:GoLytely:One Day Before a Colonoscopy:?	Start a clear liquid diet.  You may NOT eat solid foods until after your appointment. Some examples of what you can eat are:o	Water o	Clear juice (No citrus, like orange)o	Tea and coffee (No milk) o	Clear brotho	Hard candies (e.g. Lifesavers)o	Soda, Gatorade, Kool-Aid, Jell-O, [place] Ices, Popsicles (no red or purple colors)o	No food or drink that is red or purple?	Fill the gallon container that has the powder with water.  Mix well.  Put in fridge if you would like to drink it cold.?	Start your GoLytely at 6:00 PMo	Drink half of the GoLytely (about 64-ounces), within two hours.o	Put the remaining GoLytely in the fridge.?	5 hours before your appointment time:o	Drink the remaining half (about 64 ounces) within two hours. While taking the prep, you may have cramping, bloating, discomfort, and nausea.  This is very common and usually will improve by using the bathroom.  You can take a short break for up to 30 minutes if you have any of these symptoms. However, it is important to complete the prep within the times stated on the instructions above.

## 2020-04-20 NOTE — Progress Notes
[facility]      SUBJECTIVE:    Brett [name] is a 65 y.o. 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  ?  HTN  - started on chlorthalidone 25mg  during last visit  - BP today 125/86  ?  Gout  - last uric acid level 9.7 on 05/2019  - referred to Rheum. Prescribed short term colchicine and prednisone, with plan to start Allopurinol 100mg  for 2 weeks, then increase to 200mg   ?  2nd degree AV block (2:1) / RBBB  - incidental finding 05/2019 of bradycardia while admitted for gout flare  - found to have 2:1 AV heart block with mild QRS prolongation and RBBB  - underwent bipolar pacemaker on 05/22/19  - Lyme antibodies negative  - follows with [facility] in [place]  ?  EtOH/Smoking  - current smoker, for the past 50 years. Has cut down to 2 cigarettes per day, down from 1 PPD. On nicotine patches and gum  - drinks a 6 pack of beer daily, denies h/o withdrawal  - denies drug use, no marijuana    HCM  - LDCT for cancer screening ordered, not done  - Cologuard ordered, not done      The 10-year ASCVD risk score Denman George DC Montez Hageman., et al., 2013) is: 22%    Values used to calculate the score:      Age: 71 years      Sex: Male      Is Non-Hispanic African American: Yes      Diabetic: No      Tobacco smoker: Yes      Systolic Blood Pressure: 125 mmHg      Is BP treated: Yes      HDL Cholesterol: 46 mg/dL      Total Cholesterol: 142 mg/dL      REVIEW OF SYSTEMS:   Constitutional: Negative for chills and fever.   HENT: Negative for congestion and sore throat.    Eyes: Negative for blurred vision and double vision.   Respiratory: Negative for cough and shortness of breath.    Cardiovascular: Negative for chest pain, palpitations and leg swelling.   Gastrointestinal: Negative for abdominal pain, constipation, diarrhea, nausea and vomiting.   Musculoskeletal: Negative for falls and joint pain.   Neurological: Negative for dizziness, focal weakness and headaches.   Psychiatric/Behavioral: The patient is not nervous/anxious.     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  Allergies: No Known Allergies    Current Outpatient Medications:   ?  allopurinoL, 100 mg, Oral, Daily  ?  chlorthalidone, 25 mg, Oral, Daily  ?  nicotine, 1 patch, Transdermal, Daily  ?  predniSONE, 5 mg, Oral, Daily  ?  Blood Pressure Cuff, BP cuff. Medium. ICD-10: I10. Lifetime use.  ?  polyethylene glycol, 4 L, Oral, Once  ?  tadalafiL, 10 mg, Oral, DAILY PRN    OBJECTIVE:  BP 125/86 (Site: r a, Position: Sitting, Cuff Size: Medium)  - Pulse 84  - Temp 97.8 ?F (36.6 ?C) (No-touch scanner)  - Ht 6' 1 (1.854 m)  - Wt 87.2 kg  - BMI 25.37 kg/m?     PHYSICAL EXAM  Constitutional: Oriented to person, place, and time. Appears well-developed and well-nourished. No distress.   HENT:   Head: Normocephalic and atraumatic.   Eyes: Conjunctivae and EOM are normal.   Neck:  Neck supple.   Cardiovascular: Normal rate, regular rhythm and normal heart sounds.   Pulmonary/Chest: Effort normal and breath sounds normal. No respiratory distress. No wheezes.   Abdominal: Soft. No distension. There is no abdominal tenderness. There is no rebound and no guarding.   Musculoskeletal: Normal range of motion. No edema.   Neurological: Alert and oriented to person, place, and time.   Skin: Skin is warm and dry.   Psychiatric: Normal mood and affect. Judgment normal.     LABS:  Lab Results   Component Value Date    WBC 5.3 05/23/2019    HGB 14.1 05/23/2019    HCT 40.9 05/23/2019    MCV 97.6 (H) 05/23/2019    PLT 199 05/23/2019     Lab Results   Component Value Date    CREATININE 1.13 01/19/2020    BUN 13 01/19/2020    NA 139 01/19/2020    K 3.8 01/19/2020    CL 102 01/19/2020    CO2 28 01/19/2020     Lab Results   Component Value Date    GLU 99 01/19/2020    CREATININE 1.13 01/19/2020     Lab Results   Component Value Date    HGBA1C 5.5 01/19/2020    HGBA1C 5.6 05/22/2019 Immunization History   Administered Date(s) Administered   ? COVID-19 Vaccine - MODERNA 04/12/2019, 05/10/2019   ? Influenza, injectable, quadrivalent, preservative free 11/13/2019     Health Maintenance   Topic Date Due   ? Lung Cancer Screening  Never done   ? HIV screening  Never done   ? Hepatitis C screening  Never done   ? Pneumo Vaccine 19-64 Medium Risk (1 of 1 - PPSV23) Never done   ? Tetanus adult (Td q 10,TDAP once)  Never done   ? Colon cancer screening,FIT DNA (Cologuard)  Never done   ? Shingles vaccine (Shingrix) (1 of 2 - Shingrix (RZV) 2 Dose Standard Series) Never done   ? Covid-19 vaccine series (3 - Booster for Moderna series) 10/10/2019   ? Diabetes screening  01/19/2023   ? Lipid disorder screening  05/21/2024   ? Influenza vaccine  Completed   ? Colon cancer screening, Colonoscopy  Discontinued       ASSESSMENT & PLAN:  Patient Active Problem List   Diagnosis SNOMED [place](R)   ? Right knee pain PAIN IN RIGHT KNEE   ? Heart block AV second degree SECOND DEGREE ATRIOVENTRICULAR BLOCK   ? Gout GOUT   ? Hyperuricemia HYPERURICEMIA   ? Unilateral inguinal hernia without obstruction or gangrene, recurrence not specified INGUINAL HERNIA     Brett [name] was seen today for follow-up.    Diagnoses and all orders for this visit:    Hypertension, unspecified type  Comments:  Blood pressure well controlled. Continue chlorthalidone    Healthcare maintenance  Comments:  Will schedule colonoscopy. Advised to make sure his free care is up to date  Advised to schedule LDCT  Declined HIV and HCV testing  Orders:  -     Colonoscopy  -     polyethylene glycol (GOLYTELY,NULYTELY) 236-22.74-6.74 -5.86 gram solution; Take 4,000 mLs by mouth once for 1 dose. Take as indicated for colonoscopy    Erectile dysfunction, unspecified erectile dysfunction type  -     tadalafiL (CIALIS) 10 mg tablet; Take 1 tablet (10 mg total) by mouth daily as needed for erectile dysfunction.          Follow up: Return in 3 month(s).  Discussed plan of care with Attending Physician: Dr. Raleigh Callas    Signed by:    Silvio Clayman, MD  Internal Medicine, PGY-3  3:02 PM  04/19/2020

## 2020-05-10 ENCOUNTER — Ambulatory Visit: Admit: 2020-05-10 | Payer: PRIVATE HEALTH INSURANCE | Attending: Surgery | Primary: Internal Medicine

## 2020-05-10 ENCOUNTER — Encounter: Admit: 2020-05-10 | Payer: PRIVATE HEALTH INSURANCE | Attending: Surgery | Primary: Internal Medicine

## 2020-05-10 DIAGNOSIS — I441 Atrioventricular block, second degree: Secondary | ICD-10-CM

## 2020-05-10 DIAGNOSIS — M109 Gout, unspecified: Secondary | ICD-10-CM

## 2020-05-10 NOTE — Progress Notes
Subjective:   Brett [name] is a 65 y.o. male  With PMH of open L inguinal hernia repair on 03/19/2020 presenting for follow up. He has been recovering well. He has been back at work on [employer] duty. He returns today for clearance to return to work with no restrictions. Review of Systems Constitutional: Negative.  HENT: Negative.  Eyes: Negative.  Respiratory: Negative.  Cardiovascular: Negative.  Gastrointestinal: Negative.  Endocrine: Negative.  Genitourinary: Negative.  Musculoskeletal: Negative.  Allergic/Immunologic: Negative.  Neurological: Negative.  Hematological: Negative.  Psychiatric/Behavioral: Negative.    Objective:  BP (!) 142/87  - Pulse (!) 104  - Temp 97.7 ?F (36.5 ?C)  - Ht 6' 1 (1.854 m)  - Wt 88 kg  - BMI 25.60 kg/m? There were no orthostatic vitals filed for this visit.Physical ExamHENT:    Head: Normocephalic and atraumatic.    Right Ear: External ear normal.    Left Ear: External ear normal.    Nose: Nose normal.    Mouth/Throat:    Mouth: Mucous membranes are moist. Eyes:    Pupils: Pupils are equal, round, and reactive to light. Cardiovascular:    Rate and Rhythm: Normal rate.    Pulses: Normal pulses. Pulmonary:    Effort: Pulmonary effort is normal. Abdominal:    General: Abdomen is flat. Genitourinary:   Comments: L inguinal hernia incision c/d/iMusculoskeletal:       General: Normal range of motion.    Cervical back: Normal range of motion. Skin:   General: Skin is warm. Neurological:    General: No focal deficit present.    Mental Status: He is alert. Psychiatric:       Mood and Affect: Mood normal.   Assessment / Plan:  65 yo M 8 wks postop from open L inguinal hernia repair, now recovered well. He may return to work without restrictions. - Follow up PRN  Electronically Signed by Brett Bergeron, MD, May 10, 2020

## 2020-05-11 DIAGNOSIS — Z9889 Other specified postprocedural states: Secondary | ICD-10-CM

## 2020-05-17 ENCOUNTER — Telehealth: Admit: 2020-05-17 | Payer: PRIVATE HEALTH INSURANCE | Primary: Internal Medicine

## 2020-05-17 ENCOUNTER — Encounter
Admit: 2020-05-17 | Payer: PRIVATE HEALTH INSURANCE | Attending: Student in an Organized Health Care Education/Training Program | Primary: Internal Medicine

## 2020-05-17 MED ORDER — COLCHICINE 0.6 MG TABLET
0.6 mg | ORAL_TABLET | Freq: Every day | ORAL | 1 refills | Status: AC
Start: 2020-05-17 — End: 2020-05-19

## 2020-05-17 NOTE — Telephone Encounter
My chart messageRouting to clerical and provider Dr Ashley Jacobs my gout has flared up really bad and I am out of colchicine and enthemethicine. The Allurpurinol does absolutely nothing for it. I am in so much pain. My pharmacy is temporarily closed, is it possible that you could forward a prescription to [facility] on [facility] [address] [facility] [address] [facility] [address] for me ASAP.

## 2020-05-17 NOTE — Telephone Encounter
Has appt with rheum in 2 days; didn't see cochicine in the med list; last cr normal late 2021. Will give colchicine for 2 days then she should follow up with rheum for further treatment

## 2020-05-19 ENCOUNTER — Encounter: Admit: 2020-05-19 | Payer: PRIVATE HEALTH INSURANCE | Primary: Internal Medicine

## 2020-05-19 ENCOUNTER — Ambulatory Visit: Admit: 2020-05-19 | Payer: BLUE CROSS/BLUE SHIELD | Attending: Rheumatology | Primary: Internal Medicine

## 2020-05-19 DIAGNOSIS — M109 Gout, unspecified: Secondary | ICD-10-CM

## 2020-05-19 DIAGNOSIS — I441 Atrioventricular block, second degree: Secondary | ICD-10-CM

## 2020-05-19 MED ORDER — COLCHICINE 0.6 MG TABLET
0.6 mg | ORAL_TABLET | Freq: Two times a day (BID) | ORAL | 2 refills | Status: AC
Start: 2020-05-19 — End: 2020-05-24

## 2020-05-19 MED ORDER — ALLOPURINOL 300 MG TABLET
300 mg | ORAL_TABLET | Freq: Every day | ORAL | 2 refills | Status: AC
Start: 2020-05-19 — End: 2020-09-20

## 2020-05-19 MED ORDER — PREDNISONE 5 MG TABLET
5 mg | ORAL_TABLET | 1 refills | Status: AC
Start: 2020-05-19 — End: 2021-01-20

## 2020-05-19 NOTE — Patient Instructions
1. Take 200mg  allopurinol for the next 2 weeks, and then go up on it to 300mg  daily. 2. Take Colchicine 0.6mg  twice daily everyday.3. If you have an acute gout attack, take prednisone 5mg  daily x 3-4 days.

## 2020-05-23 ENCOUNTER — Encounter
Admit: 2020-05-23 | Payer: PRIVATE HEALTH INSURANCE | Attending: Student in an Organized Health Care Education/Training Program | Primary: Internal Medicine

## 2020-05-24 ENCOUNTER — Encounter
Admit: 2020-05-24 | Payer: PRIVATE HEALTH INSURANCE | Attending: Student in an Organized Health Care Education/Training Program | Primary: Internal Medicine

## 2020-05-24 ENCOUNTER — Telehealth: Admit: 2020-05-24 | Payer: PRIVATE HEALTH INSURANCE | Primary: Internal Medicine

## 2020-05-24 ENCOUNTER — Encounter: Admit: 2020-05-24 | Payer: PRIVATE HEALTH INSURANCE | Primary: Internal Medicine

## 2020-05-24 DIAGNOSIS — M109 Gout, unspecified: Secondary | ICD-10-CM

## 2020-05-24 MED ORDER — COLCHICINE 0.6 MG TABLET
0.6 mg | ORAL_TABLET | Freq: Two times a day (BID) | ORAL | 4 refills | Status: AC
Start: 2020-05-24 — End: 2020-12-09

## 2020-05-24 NOTE — Telephone Encounter
Message as noted routing to admin provider to follow up with pt

## 2020-05-24 NOTE — Telephone Encounter
-----   Message from [name] sent at 05/23/2020  5:52 PM EDT -----Regarding: Increasing the dosage of Tadalafil Dr Ashley Jacobs is it possible for me to take 2 ten MG of this medication because it seems that 1 ten MG is not working for me. Please let me know asap. Thank you

## 2020-05-24 NOTE — Telephone Encounter
Called patient; he can take 20mg  of cialis but informed him that 20mg  should be the maximum dose. He has a visit in 2 weeks and will need erectile dysfunction evaluation as well as possible urology referral.

## 2020-06-07 ENCOUNTER — Ambulatory Visit
Admit: 2020-06-07 | Payer: BLUE CROSS/BLUE SHIELD | Attending: Student in an Organized Health Care Education/Training Program | Primary: Internal Medicine

## 2020-06-07 ENCOUNTER — Encounter
Admit: 2020-06-07 | Payer: PRIVATE HEALTH INSURANCE | Attending: Student in an Organized Health Care Education/Training Program | Primary: Internal Medicine

## 2020-06-07 DIAGNOSIS — N529 Male erectile dysfunction, unspecified: Secondary | ICD-10-CM

## 2020-06-07 DIAGNOSIS — I441 Atrioventricular block, second degree: Secondary | ICD-10-CM

## 2020-06-07 DIAGNOSIS — M109 Gout, unspecified: Secondary | ICD-10-CM

## 2020-06-07 MED ORDER — TADALAFIL 10 MG TABLET
10 mg | ORAL | Status: AC
Start: 2020-06-07 — End: 2020-06-07

## 2020-06-07 MED ORDER — TADALAFIL 20 MG TABLET
20 mg | ORAL_TABLET | Freq: Every day | ORAL | 3 refills | Status: AC | PRN
Start: 2020-06-07 — End: 2021-01-20

## 2020-06-07 NOTE — Patient Instructions
Take Tadalafil 20mg  daily as needed

## 2020-06-08 DIAGNOSIS — Z0000 Encounter for general adult medical examination without abnormal findings: Secondary | ICD-10-CM

## 2020-06-08 NOTE — Progress Notes
[facility]      SUBJECTIVE:    Brett [name] is a 65 y.o. 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    Today he is asking for a refill for Cialis. He has been taking two 10mg  because he does not have an effect with one pill.    HbA1c 5.5 on 12/2019    Blood pressure well controlled on chlorthalidone.    Following with Rheumatology for gout. On allopurinol and colchicine. Has stopped drinking beer.    Currently smokes 5-6 cigarettes per day.    The 10-year ASCVD risk score Denman George DC Montez Hageman., et al., 2013) is: 21.4%    Values used to calculate the score:      Age: 4 years      Sex: Male      Is Non-Hispanic African American: Yes      Diabetic: No      Tobacco smoker: Yes      Systolic Blood Pressure: 123 mmHg      Is BP treated: Yes      HDL Cholesterol: 46 mg/dL      Total Cholesterol: 142 mg/dL      REVIEW OF SYSTEMS:   Constitutional: Negative for chills and fever.   HENT: Negative for congestion and sore throat.    Eyes: Negative for blurred vision and double vision.   Respiratory: Negative for cough and shortness of breath.    Cardiovascular: Negative for chest pain, palpitations and leg swelling.   Gastrointestinal: Negative for abdominal pain, constipation, diarrhea, nausea and vomiting.   Musculoskeletal: Negative for falls and joint pain.   Neurological: Negative for dizziness, focal weakness and headaches.   Psychiatric/Behavioral: The patient is not nervous/anxious.     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  Allergies: No Known Allergies    Current Outpatient Medications:   ?  allopurinoL, 300 mg, Oral, Daily  ?  chlorthalidone, 25 mg, Oral, Daily  ?  colchicine, 0.6 mg, Oral, BID  ?  nicotine, 1 patch, Transdermal, Daily  ?  tadalafiL, 20 mg, Oral, DAILY PRN  ?  Blood Pressure Cuff, BP cuff. Medium. ICD-10: I10. Lifetime use.  ?  predniSONE, Take 5mg  daily for 3-4 days only during an acute gout attack. (Patient not taking: Reported on 06/07/2020)    OBJECTIVE:  BP 123/77 (Site: l a, Position: Sitting, Cuff Size: Large)  - Pulse (!) 91  - Temp 97.9 ?F (36.6 ?C) (No-touch scanner)  - Ht 6' 1 (1.854 m)  - Wt 88.1 kg  - SpO2 97%  - BMI 25.62 kg/m?     PHYSICAL EXAM  Constitutional: Oriented to person, place, and time. Appears well-developed and well-nourished. No distress.   HENT:   Head: Normocephalic and atraumatic.   Eyes: Conjunctivae and EOM are normal.   Neck: Neck supple.   Cardiovascular: Normal rate, regular rhythm and normal heart sounds.   Pulmonary/Chest: Effort normal and breath sounds normal. No respiratory distress. No wheezes.   Abdominal: Soft. No distension. There is no abdominal tenderness. There is no rebound and no guarding.   Musculoskeletal: Normal range of motion. No edema.   Neurological: Alert and oriented to person, place, and time.   Skin: Skin is warm and dry.   Psychiatric: Normal mood and affect. Judgment normal.     LABS:  Lab Results  Component Value Date    WBC 5.3 05/23/2019    HGB 14.1 05/23/2019    HCT 40.9 05/23/2019    MCV 97.6 (H) 05/23/2019    PLT 199 05/23/2019     Lab Results   Component Value Date    CREATININE 1.13 01/19/2020    BUN 13 01/19/2020    NA 139 01/19/2020    K 3.8 01/19/2020    CL 102 01/19/2020    CO2 28 01/19/2020     Lab Results   Component Value Date    GLU 99 01/19/2020    CREATININE 1.13 01/19/2020     Lab Results   Component Value Date    HGBA1C 5.5 01/19/2020    HGBA1C 5.6 05/22/2019     Immunization History   Administered Date(s) Administered   ? COVID-19 Vaccine - MODERNA 04/12/2019, 05/10/2019   ? Influenza, injectable, quadrivalent, preservative free 11/13/2019     Health Maintenance   Topic Date Due   ? Lung Cancer Screening  Never done   ? HIV screening  Never done   ? Hepatitis C screening  Never done   ? Pneumo Vaccine 19-64 Medium Risk (1 of 1 - PPSV23) Never done   ? Tetanus adult (Td q 10,TDAP once)  Never done   ? Colon cancer screening,FIT DNA (Cologuard)  Never done   ? Shingles vaccine (Shingrix) (1 of 2 - Shingrix (RZV) 2 Dose Standard Series) Never done   ? Covid-19 vaccine series (3 - Booster for Moderna series) 10/10/2019   ? Influenza vaccine  09/30/2020   ? Diabetes screening  01/19/2023   ? Lipid disorder screening  05/21/2024   ? Colon cancer screening, Colonoscopy  Discontinued       ASSESSMENT & PLAN:  Patient Active Problem List   Diagnosis SNOMED [facility](R)   ? Right knee pain PAIN OF RIGHT KNEE REGION   ? Heart block AV second degree SECOND DEGREE ATRIOVENTRICULAR BLOCK   ? Gout GOUT   ? Hyperuricemia HYPERURICEMIA     Brett [name] was seen today for follow-up.    Diagnoses and all orders for this visit:    Erectile dysfunction, unspecified erectile dysfunction type  -     tadalafiL (CIALIS) 20 mg tablet; Take 1 tablet (20 mg total) by mouth daily as needed for erectile dysfunction.    Healthcare maintenance  Comments:  Declined HIV and HCV screening. Declined all vaccines  Colonoscopy scheduled 06/21/20    Other orders  -     Discontinue: tadalafiL (CIALIS) 10 mg tablet; Take 20 mg by mouth.      Follow up: Return in 3 month(s).    Discussed plan of care with Attending Physician: Dr. Princess Bruins    Signed by:    Silvio Clayman, MD  Internal Medicine, PGY-3  4:24 PM  06/08/2020

## 2020-06-09 ENCOUNTER — Encounter: Admit: 2020-06-09 | Payer: PRIVATE HEALTH INSURANCE | Attending: Adult Health | Primary: Internal Medicine

## 2020-06-09 DIAGNOSIS — Z01818 Encounter for other preprocedural examination: Secondary | ICD-10-CM

## 2020-06-18 ENCOUNTER — Ambulatory Visit: Admit: 2020-06-18 | Payer: PRIVATE HEALTH INSURANCE | Primary: Internal Medicine

## 2020-06-18 ENCOUNTER — Inpatient Hospital Stay: Admit: 2020-06-18 | Discharge: 2020-06-18 | Payer: BLUE CROSS/BLUE SHIELD | Primary: Internal Medicine

## 2020-06-18 DIAGNOSIS — Z01812 Encounter for preprocedural laboratory examination: Secondary | ICD-10-CM

## 2020-06-18 DIAGNOSIS — Z01818 Encounter for other preprocedural examination: Secondary | ICD-10-CM

## 2020-06-18 DIAGNOSIS — Z20822 Contact with and (suspected) exposure to covid-19: Secondary | ICD-10-CM

## 2020-06-18 LAB — COVID-19 CLEARANCE OR FOR PLACEMENT ONLY: BKR SARS-COV-2 RNA (COVID-19) (YH): NEGATIVE

## 2020-06-20 ENCOUNTER — Encounter
Admit: 2020-06-20 | Payer: PRIVATE HEALTH INSURANCE | Attending: Student in an Organized Health Care Education/Training Program | Primary: Internal Medicine

## 2020-06-21 ENCOUNTER — Inpatient Hospital Stay
Admit: 2020-06-21 | Discharge: 2020-06-21 | Payer: BLUE CROSS/BLUE SHIELD | Attending: Gastroenterology | Primary: Internal Medicine

## 2020-06-21 ENCOUNTER — Encounter: Admit: 2020-06-21 | Payer: PRIVATE HEALTH INSURANCE | Attending: Gastroenterology | Primary: Internal Medicine

## 2020-06-21 ENCOUNTER — Encounter
Admit: 2020-06-21 | Payer: PRIVATE HEALTH INSURANCE | Attending: Certified Registered Nurse Anesthetist | Primary: Internal Medicine

## 2020-06-21 ENCOUNTER — Telehealth: Admit: 2020-06-21 | Payer: PRIVATE HEALTH INSURANCE | Primary: Internal Medicine

## 2020-06-21 ENCOUNTER — Ambulatory Visit
Admit: 2020-06-21 | Payer: BLUE CROSS/BLUE SHIELD | Attending: Certified Registered Nurse Anesthetist | Primary: Internal Medicine

## 2020-06-21 DIAGNOSIS — D120 Benign neoplasm of cecum: Secondary | ICD-10-CM

## 2020-06-21 DIAGNOSIS — Z1211 Encounter for screening for malignant neoplasm of colon: Secondary | ICD-10-CM

## 2020-06-21 DIAGNOSIS — K648 Other hemorrhoids: Secondary | ICD-10-CM

## 2020-06-21 DIAGNOSIS — M109 Gout, unspecified: Secondary | ICD-10-CM

## 2020-06-21 DIAGNOSIS — I10 Essential (primary) hypertension: Secondary | ICD-10-CM

## 2020-06-21 DIAGNOSIS — Z6826 Body mass index (BMI) 26.0-26.9, adult: Secondary | ICD-10-CM

## 2020-06-21 DIAGNOSIS — Z79899 Other long term (current) drug therapy: Secondary | ICD-10-CM

## 2020-06-21 DIAGNOSIS — Z7952 Long term (current) use of systemic steroids: Secondary | ICD-10-CM

## 2020-06-21 DIAGNOSIS — J449 Chronic obstructive pulmonary disease, unspecified: Secondary | ICD-10-CM

## 2020-06-21 DIAGNOSIS — Z950 Presence of cardiac pacemaker: Secondary | ICD-10-CM

## 2020-06-21 DIAGNOSIS — F17210 Nicotine dependence, cigarettes, uncomplicated: Secondary | ICD-10-CM

## 2020-06-21 DIAGNOSIS — D124 Benign neoplasm of descending colon: Secondary | ICD-10-CM

## 2020-06-21 DIAGNOSIS — E669 Obesity, unspecified: Secondary | ICD-10-CM

## 2020-06-21 DIAGNOSIS — D122 Benign neoplasm of ascending colon: Secondary | ICD-10-CM

## 2020-06-21 DIAGNOSIS — I441 Atrioventricular block, second degree: Secondary | ICD-10-CM

## 2020-06-21 MED ORDER — INDOMETHACIN 25 MG CAPSULE
25 mg | ORAL_CAPSULE | Freq: Every day | ORAL | 1 refills | Status: AC | PRN
Start: 2020-06-21 — End: 2020-06-22

## 2020-06-21 MED ORDER — LIDOCAINE (PF) 20 MG/ML (2 %) INJECTION SOLUTION
20 mg/mL (2 %) | INTRAVENOUS | Status: DC | PRN
Start: 2020-06-21 — End: 2020-06-21
  Administered 2020-06-21: 18:00:00 20 mg/mL (2 %) via INTRAVENOUS

## 2020-06-21 MED ORDER — INDOMETHACIN 25 MG CAPSULE
25 mg | ORAL | Status: AC
Start: 2020-06-21 — End: 2020-06-21

## 2020-06-21 MED ORDER — PROPOFOL 10 MG/ML INTRAVENOUS EMULSION
10 mg/mL | Status: CP
Start: 2020-06-21 — End: ?

## 2020-06-21 MED ORDER — PROPOFOL 10 MG/ML INTRAVENOUS EMULSION
10 mg/mL | INTRAVENOUS | Status: DC | PRN
Start: 2020-06-21 — End: 2020-06-21
  Administered 2020-06-21 (×10): 10 mg/mL via INTRAVENOUS

## 2020-06-21 MED ORDER — LACTATED RINGERS INTRAVENOUS SOLUTION
INTRAVENOUS | Status: DC | PRN
Start: 2020-06-21 — End: 2020-06-21
  Administered 2020-06-21: 18:00:00 via INTRAVENOUS

## 2020-06-21 MED ORDER — LACTATED RINGERS INTRAVENOUS SOLUTION
INTRAVENOUS | Status: DC
Start: 2020-06-21 — End: 2020-06-21

## 2020-06-21 NOTE — Telephone Encounter
-----   Message from [name] sent at 06/21/2020  1:56 PM EDT -----Regarding: Gout meds I am having the procedure as we speak. Wasn't canceled the times were different. I need a script for endemiacin for at least 6 each the other meds aren't working. ASAP PLEASE, my leg is swollen and it hurts really bad

## 2020-06-21 NOTE — Anesthesia Pre-Procedure Evaluation
This is a 65 y.o. male scheduled for COLONOSCOPY, FLEXIBLE; DX, W/WO SPECIMENS/COLON DECOMP (SEP PROC) (N/A ).Review of Systems/ Medical HistoryPatient summary, nursing notes, EKG/Cardiac Studies , Labs, pre-procedure vitals, height, weight and NPO status reviewed.No previous anesthesia concernsAnesthesia Evaluation:   No history of anesthetic complications  Estimated body mass index is 26.31 kg/m? as calculated from the following:  Height as of this encounter: 6' (1.829 m).  Weight as of this encounter: 88 kg. 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 -Exercise tolerance: >4 METS -Dysrhythmia(s): yes: atrial and nodal-Device(s): pacemaker-Vascular Disease:  Negative   Respiratory: -Lung Disorders: -COPD:  yes, mild COPD.-Nicotine Dependence: yes, smoking HEENT: Negative.Neuromuscular: NegativeSkeletal/Skin:  NegativeGastrointestinal/Genitourinary: -Nutritional Disorders: Patient has has increased body weight- obesity.Hematological/Lymphatic: NegativeEndocrine/Metabolic:  Negative.Behavioral/Psychiatric & Syndromes: NegativePhysical ExamCardiovascular:    Rhythm: regularHeart Sounds: S1 present and S2 present.Pulmonary:  decreased breath soundsPatient's breath sounds clear to auscultationAirway:  Mallampati: IITM distance: >3 FBNeck ROM: fullDental:  Dentition: missing and chippedAnesthesia PlanASA 3 The primary anesthesia plan is  general. Perioperative Code Status confirmed: It is my understanding that the patient is currently designated as 'Full Code' and will remain so throughout the perioperative period.Anesthesia informed consent obtained. Consent obtained from: patientThe post operative pain plan is per surgeon management.Plan discussed with CRNA.Anesthesiologist's Pre Op NoteI personally evaluated and examined the patient prior to the intra-operative phase of care.

## 2020-06-21 NOTE — Telephone Encounter
Pt has a colo todayMessage as noted re reorder indocin routing to provider to order

## 2020-06-21 NOTE — Other
Post Anesthesia Transfer of Care NotePatient: Brett Wayne SladeProcedure(s) Performed: Procedure(s):COLONOSCOPY, FLEXIBLE; W/REMOVAL, LESION, SNARE, COLD SNARE COLON POLYPSCOLONOSCOPY, FLEXIBLE; W/BX, SINGLE/MULTIPLE Patient location: PACU Last Vitals: Vitals Value Taken Time BP 129/78 06/21/20 1502 Temp 36.8 ?C 06/21/20 1502 Pulse 77 06/21/20 1502 Resp 20 06/21/20 1502 SpO2 100 % 06/21/20 1502 Level of consciousness: sedatedTransport Vital Signs:  Stable since the last set of recorded intra-operative vital signsIntra-operative Complications: noneIntra-operative Intake & Output and Antibiotics as per Anesthesia record and discussed with the RN.

## 2020-06-21 NOTE — H&P
[facility]	 [facility]	 Gastroenterology History & PhysicalHistory provided by: the patientHistory limited by: no limitationsSubjective: Screen for CRCMedical History: PMH PSH Past Medical History: Diagnosis Date ? 2nd degree AV block  ? Gout   Past Surgical History: Procedure Laterality Date ? HERNIA REPAIR   ? PACEMAKER INSERTION    Social History Family History Social History Tobacco Use ? Smoking status: Current Every Day Smoker   Packs/day: 0.50   Years: 50.00   Pack years: 25.00   Types: Cigarettes ? Smokeless tobacco: Never Used Substance Use Topics ? Alcohol use: Not Currently   Alcohol/week: 10.0 standard drinks   Types: 10 Glasses of wine per week   Comment: as of 06/21/20 10 glasses of wine a week  History reviewed. No pertinent family history. Medications Outpatient Medications Marked as Taking for the 06/21/20 encounter [facility] Encounter) Medication Sig Dispense Refill ? allopurinoL (ZYLOPRIM) 300 mg tablet Take 1 tablet (300 mg total) by mouth daily. 60 tablet 1 ? chlorthalidone (HYGROTEN) 25 mg tablet Take 1 tablet (25 mg total) by mouth daily. 30 tablet 11 ? colchicine (COLCRYS) 0.6 mg tablet Take 1 tablet (0.6 mg total) by mouth 2 (two) times daily. 60 tablet 3 ? indomethacin (INDOCIN) 25 mg capsule Take 25 mg by mouth.   ? predniSONE (DELTASONE) 5 mg tablet Take 5mg  daily for 3-4 days only during an acute gout attack. 10 tablet 0  Allergies No Known Allergies Review of Systems: Otherwise, he constitutional, head, eye, ENT, musculoskeletal, skin, endocrine, hematologic, allergy, and immunologic ROS are unremarkable.Objective: Vitals:Last 24 hours: Temp:  [98.5 ?F (36.9 ?C)] 98.5 ?F (36.9 ?C)Pulse:  [93] 93Resp:  [20] 20BP: (160)/(99) 160/99SpO2:  [98 %] 98 %Physical Exam: Older male nadPERRLCTA b/lS1 S2 no mrgSoft/bs+/ntAssessment: Screen for CRCPlan: colonoscopyNotifications: Plan discussed with patient and/or family. YesSigned:Beeper: [phone]/23/20222:17 PM

## 2020-06-21 NOTE — Other
Patient tolerated procedure well. Dr.loeser    Spoke with patient and provided an explanation of findings from procedure.  Patient provided with education and discharge instruction. Questions answered and verbalized understanding.

## 2020-06-22 ENCOUNTER — Encounter: Admit: 2020-06-22 | Payer: PRIVATE HEALTH INSURANCE | Primary: Internal Medicine

## 2020-06-22 ENCOUNTER — Encounter: Admit: 2020-06-22 | Payer: PRIVATE HEALTH INSURANCE | Attending: Gastroenterology | Primary: Internal Medicine

## 2020-06-22 ENCOUNTER — Telehealth: Admit: 2020-06-22 | Payer: PRIVATE HEALTH INSURANCE | Primary: Internal Medicine

## 2020-06-22 DIAGNOSIS — M109 Gout, unspecified: Secondary | ICD-10-CM

## 2020-06-22 DIAGNOSIS — I441 Atrioventricular block, second degree: Secondary | ICD-10-CM

## 2020-06-22 MED ORDER — INDOMETHACIN 25 MG CAPSULE
25 mg | ORAL_CAPSULE | Freq: Every day | ORAL | 1 refills | Status: AC | PRN
Start: 2020-06-22 — End: 2020-09-06

## 2020-06-22 NOTE — Telephone Encounter
Previous script went to wrong pharmacy Routing to provider

## 2020-06-22 NOTE — Anesthesia Post-Procedure Evaluation
Anesthesia Post-op NotePatient: Brett Riding SladeProcedure(s):  Procedure(s):COLONOSCOPY, FLEXIBLE; W/REMOVAL, LESION, SNARE, COLD SNARE COLON POLYPSCOLONOSCOPY, FLEXIBLE; W/BX, SINGLE/MULTIPLE Patient location: PACULast Vitals:  I have noted the vital signs as listed in the nursing notes.Mental status recovered: patient participates in evaluation: YesVital signs reviewed: YesRespiratory function stable:YesAirway is patent: YesCardiovascular function and hydration status stable: YesPain control satisfactory: YesNausea and vomiting control satisfactory:YesThere were no known complications for this encounter.

## 2020-06-22 NOTE — Telephone Encounter
Spoke with pt tadalafiL (CIALIS) was taken care of yesterday Today he is looking for indocin He is having trouble with his gout and difficulty walking Last refill 5/23 ,went to wrong location ,it should be [facility] [address] Refill sent to [facility] provider

## 2020-06-22 NOTE — Other
Operative Diagnosis:Pre-op:   * No pre-op diagnosis entered * Patient Coded Diagnosis   Pre-op diagnosis: Screen for colon cancer  Post-op diagnosis: Polyp of colon, unspecified part of colon, unspecified type, Hemorrhoids, unspecified hemorrhoid type  Patient Diagnosis   None    Post-op diagnosis:   * Polyp of colon, unspecified part of colon, unspecified type [K63.5]   * Hemorrhoids, unspecified hemorrhoid type [A21.9]Operative Procedure(s) :Procedure(s):COLONOSCOPY, FLEXIBLE; W/REMOVAL, LESION, SNARE, COLD SNARE COLON POLYPSCOLONOSCOPY, FLEXIBLE; W/BX, SINGLE/MULTIPLEPost-op Procedure & Diagnosis ConfirmationPost-op Diagnosis: Post-op Diagnosis updated (see notes)     - Polyp of colon, unspecified part of colon, unspecified type, Hemorrhoids, unspecified hemorrhoid typePost-op Procedure: Post-op Procedure confirmed (no changes)Anesthesia ClarifiersGI/Endoscopy: Planned Screening Colonoscopy - Procedure Performed (see above)

## 2020-07-07 ENCOUNTER — Ambulatory Visit: Admit: 2020-07-07 | Payer: BLUE CROSS/BLUE SHIELD | Primary: Internal Medicine

## 2020-07-19 ENCOUNTER — Encounter
Admit: 2020-07-19 | Payer: PRIVATE HEALTH INSURANCE | Attending: Student in an Organized Health Care Education/Training Program | Primary: Internal Medicine

## 2020-07-19 DIAGNOSIS — M109 Gout, unspecified: Secondary | ICD-10-CM

## 2020-08-09 ENCOUNTER — Ambulatory Visit: Admit: 2020-08-09 | Payer: BLUE CROSS/BLUE SHIELD | Attending: Adult Health | Primary: Internal Medicine

## 2020-09-05 ENCOUNTER — Encounter
Admit: 2020-09-05 | Payer: PRIVATE HEALTH INSURANCE | Attending: Student in an Organized Health Care Education/Training Program | Primary: Internal Medicine

## 2020-09-05 DIAGNOSIS — M109 Gout, unspecified: Secondary | ICD-10-CM

## 2020-09-06 MED ORDER — INDOMETHACIN 25 MG CAPSULE
25 mg | ORAL_CAPSULE | 1 refills | Status: AC
Start: 2020-09-06 — End: 2020-10-10

## 2020-09-15 NOTE — Progress Notes
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 Had been on allopurinol but he stopped because thought it was making things worse. Has been taking colchicine instead. Gout 5-6 years, 4-5 attacks/year in the past year (previously 1 attack/year)Takes colchicine every few months when he feels like attack is coming on. Last attack was last weekend. He took colchicine and it is almost gone. A mild tenderness on the right elblow left.He drinks a couple beer several times throughout the week. Outpatient MedicationsOutpatient Medications Marked as Taking for the 05/19/20 encounter (Follow-Up) with [facility] Medication Sig Dispense Refill ? chlorthalidone (HYGROTEN) 25 mg tablet Take 1 tablet (25 mg total) by mouth daily. 30 tablet 11 ? colchicine (COLCRYS) 0.6 mg tablet Take 1 tablet (0.6 mg total) by mouth 2 (two) times daily. 120 tablet 1 ? Miscellaneous Medical [employer] (BLOOD PRESSURE CUFF) BP cuff. Medium. ICD-10: I10. Lifetime use. 1 each 0 ? nicotine (NICODERM CQ) 14 mg transdermal patch Place 1 patch (14 mg total) onto the skin daily. Alternate arms/sites when applying patch. Discard old patch when applying new one 28 patch 2 ? tadalafiL (CIALIS) 10 mg tablet Take 1 tablet (10 mg total) by mouth daily as needed for erectile dysfunction. 10 tablet 1 ? [DISCONTINUED] allopurinoL (ZYLOPRIM) 100 mg tablet Take 1 tablet (100 mg total) by mouth daily. (Patient taking differently: Take 200 mg by mouth daily. ) 30 tablet 3 ? [DISCONTINUED] colchicine (COLCRYS) 0.6 mg tablet Take 1 tablet (0.6 mg total) by mouth daily. 4 tablet 0 Objective: Vitals:  05/19/20 1458 BP: 131/80 Site: Left Arm Position: Sitting Cuff Size: Medium Pulse: (!) 95 Temp: 97.7 ?F (36.5 ?C) TempSrc: No-touch scanner Weight: 87 kg Height: 6' 1 (1.854 m) Wt: 05/19/20 87 kg 05/10/20 88 kg 04/19/20 87.2 kg General: AAOx3 HEENT: Head atraumatic/ normocephalic, PERRLA, anicteric sclera, MMM, Neck supple.Resp: Non-tender, resonant on percussion, no tactile fremitus, clear to auscultate b/l. Cardio: RRR, S1 and S2 present, no neck distention, no JVD present, no Murmurs, Gallops or Bruits. Abdominal: Bowel sounds present, resonant on percussion, soft, non-tender, and non-distended. Skin/Extremities: Skin intact, no rash, +2 distal pulses b/l, no lower extremity edema b/l. Mild tenderness in the right elbow joint. No significant swelling, redness or heatness. No bony abnormalities. Neuro: CN II-XII grossly intact b/l. Motor Strength 5/5 b/l. Sensation intact b/l.  Reflexes 2+ b/l. Labs:Complete Blood Count:Lab Results Component Value Date  WBC 5.3 05/23/2019  RBC 4.2 (L) 05/23/2019  HGB 14.1 05/23/2019  HCT 40.9 05/23/2019  MCV 97.6 (H) 05/23/2019  MCH 33.7 05/23/2019  MCHC 34.5 05/23/2019  PLT 199 05/23/2019  MPV 10.8 05/23/2019 Comprehensive Metabolic Panel:Lab Results Component Value Date  GLU 99 01/19/2020  BUN 13 01/19/2020  CREATININE 1.13 01/19/2020  NA 139 01/19/2020  K 3.8 01/19/2020  CL 102 01/19/2020  CO2 28 01/19/2020  ALBUMIN 4.7 01/19/2020  PROT 7.9 01/19/2020  BILITOT 0.4 01/19/2020  ALKPHOS 109 01/19/2020  ALT 23 01/19/2020  GLOB 3.2 01/19/2020  CALCIUM 9.3 01/19/2020 ImagingNo results found.ECHO No results found for this or any previous visit.EKG  The 10-year ASCVD risk score Denman George DC Jr., et al., 2013) is: 23.8%  Values used to calculate the score:    Age: 65 years  Sex: Male    Is Non-Hispanic African American: Yes Diabetic: No    Tobacco smoker: Yes    Systolic Blood Pressure: 131 mmHg    Is BP treated: Yes    HDL Cholesterol: 46 mg/dL    Total Cholesterol: 161 mg/dLMental Health ScreeningNo data recordedAssessment and Plan: 65 year old male with history of 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 Do NOT stop during acute attacks. (2) Start taking colchicine 0.6 mg BID regularly. Do NOT stop during acute attacks. (3) Use prednisone 5mg  QD for 3-4 days when you have an acute gouty attack. (4) Stop ETOH use, especially beer (5) Avoid red meat and try to lose weight.- We will draw blood upon next visit to check uric acid levels.Follow-Up: Return in about 2 months (around 07/19/2020).Case discussed with Dr. Filiberto Pinks, attending addendum to follow. Signed:Weldon Picking M.D. Internal Medicine, PGY-44/20/2022 I have reviewed the HPI, PMH, Medications, Allergies, Surgeries, Family History, Social History and relevant Review of Systems with this patient, and with  the resident.  I also reviewed the medical chart and currently available medical/neurologic records, and the key/critical portions of the Medical Assistant's Review of Systems.?I have personally performed the key/critical portions of the rheumatology examination, and have discussed the pertinent findings, as well as further rheumatologic evaluation and management with the patient and with the resident?I have reviewed the resident's clinic note for today's visit and am in agreement with the documented findings, assessment and plan as outlined.Electronically Signed by Davonna Belling, MD, September 15, 2020

## 2020-09-17 ENCOUNTER — Encounter: Admit: 2020-09-17 | Payer: PRIVATE HEALTH INSURANCE | Attending: Internal Medicine | Primary: Internal Medicine

## 2020-09-17 ENCOUNTER — Inpatient Hospital Stay: Admit: 2020-09-17 | Discharge: 2020-09-17 | Payer: BLUE CROSS/BLUE SHIELD | Primary: Internal Medicine

## 2020-09-17 ENCOUNTER — Ambulatory Visit: Admit: 2020-09-17 | Payer: BLUE CROSS/BLUE SHIELD | Attending: Internal Medicine | Primary: Internal Medicine

## 2020-09-17 DIAGNOSIS — I10 Essential (primary) hypertension: Secondary | ICD-10-CM

## 2020-09-17 DIAGNOSIS — M109 Gout, unspecified: Secondary | ICD-10-CM

## 2020-09-17 DIAGNOSIS — N529 Male erectile dysfunction, unspecified: Secondary | ICD-10-CM

## 2020-09-17 DIAGNOSIS — I441 Atrioventricular block, second degree: Secondary | ICD-10-CM

## 2020-09-17 DIAGNOSIS — Z0000 Encounter for general adult medical examination without abnormal findings: Secondary | ICD-10-CM

## 2020-09-17 LAB — COMPREHENSIVE METABOLIC PANEL
BKR A/G RATIO: 1.6 % (ref 1.0–2.2)
BKR ALANINE AMINOTRANSFERASE (ALT): 24 U/L (ref 9–59)
BKR ALBUMIN: 4.4 g/dL (ref 3.6–4.9)
BKR ALKALINE PHOSPHATASE: 153 U/L — ABNORMAL HIGH (ref 9–122)
BKR ANION GAP: 12 g/dL (ref 7–17)
BKR ASPARTATE AMINOTRANSFERASE (AST): 33 U/L (ref 10–35)
BKR BILIRUBIN TOTAL: 0.4 mg/dL (ref <=1.2)
BKR BLOOD UREA NITROGEN: 21 mg/dL (ref 8–23)
BKR BUN / CREAT RATIO: 15.3 % (ref 8.0–23.0)
BKR CALCIUM: 9.9 mg/dL (ref 8.8–10.2)
BKR CHLORIDE: 99 mmol/L (ref 98–107)
BKR CO2: 26 mmol/L (ref 20–30)
BKR CREATININE: 1.37 mg/dL — ABNORMAL HIGH (ref 0.40–1.30)
BKR EGFR, CREATININE (CKD-EPI 2021): 58 mL/min/1.73m2 — ABNORMAL LOW (ref >=60–7.60)
BKR GLOBULIN: 2.7 g/dL (ref 2.3–3.5)
BKR GLUCOSE: 124 mg/dL — ABNORMAL HIGH (ref 70–100)
BKR POTASSIUM: 3.5 mmol/L (ref 3.3–5.3)
BKR PROTEIN TOTAL: 7.1 g/dL (ref 6.6–8.7)
BKR SODIUM: 137 mmol/L (ref 136–144)

## 2020-09-17 LAB — CBC WITH AUTO DIFFERENTIAL
BKR AST/ALT RATIO: 0 % (ref 0.0–1.0)
BKR PHOSPHORUS: 0 x 1000/??L (ref 0.00–1.00)
BKR WAM ABSOLUTE IMMATURE GRANULOCYTES.: 0.02 x 1000/ÂµL (ref 0.00–0.30)
BKR WAM ABSOLUTE LYMPHOCYTE COUNT.: 1.48 x 1000/??L (ref 0.60–3.70)
BKR WAM ABSOLUTE NRBC (2 DEC): 0 x 1000/ÂµL (ref 0.00–1.00)
BKR WAM ANALYZER ANC: 3.48 x 1000/ÂµL (ref 2.00–7.60)
BKR WAM BASOPHIL ABSOLUTE COUNT.: 0.04 x 1000/ÂµL (ref 0.00–1.00)
BKR WAM BASOPHILS: 0.7 % (ref 0.0–1.4)
BKR WAM EOSINOPHIL ABSOLUTE COUNT.: 0.17 x 1000/??L (ref 0.00–1.00)
BKR WAM EOSINOPHILS: 3 % (ref 0.0–5.0)
BKR WAM HEMATOCRIT (2 DEC): 43.3 % — ABNORMAL HIGH (ref 38.50–50.00)
BKR WAM HEMOGLOBIN: 15.4 g/dL (ref 13.2–17.1)
BKR WAM IMMATURE GRANULOCYTES: 0.4 % (ref 0.0–1.0)
BKR WAM LYMPHOCYTES: 25.9 % — ABNORMAL HIGH (ref 17.0–50.0)
BKR WAM MCH (PG): 34.1 pg — ABNORMAL HIGH (ref 27.0–33.0)
BKR WAM MCHC: 35.6 g/dL (ref 31.0–36.0)
BKR WAM MCV: 96 fL (ref 80.0–100.0)
BKR WAM MONOCYTES: 9.1 % (ref 4.0–12.0)
BKR WAM MPV: 10.6 fL (ref 8.0–12.0)
BKR WAM NEUTROPHILS: 60.9 % (ref 39.0–72.0)
BKR WAM NUCLEATED RED BLOOD CELLS: 0 % (ref 0.0–1.0)
BKR WAM PLATELETS: 222 x1000/ÂµL (ref 150–420)
BKR WAM RDW-CV: 14.3 % (ref 11.0–15.0)
BKR WAM RED BLOOD CELL COUNT.: 4.51 M/??L (ref 4.00–6.00)
BKR WAM WHITE BLOOD CELL COUNT: 5.7 x1000/??L (ref 4.0–11.0)

## 2020-09-17 LAB — URIC ACID: BKR URIC ACID: 3.3 mg/dL (ref 2.7–7.3)

## 2020-09-17 LAB — LIPID PANEL
BKR CHOLESTEROL/HDL RATIO: 3.8 mmol/L (ref 0.0–5.0)
BKR CHOLESTEROL: 199 mg/dL
BKR HDL CHOLESTEROL: 53 mg/dL (ref >=40)
BKR LDL CHOLESTEROL SAMPSON CALCULATED: 72 mg/dL
BKR NITRITE, UA: 199 mg/dL
BKR TRIGLYCERIDES: 476 mg/dL — ABNORMAL HIGH (ref 136–144)

## 2020-09-17 NOTE — Patient Instructions
3 months follow-up Obtain bloodwork today

## 2020-09-18 ENCOUNTER — Encounter
Admit: 2020-09-18 | Payer: PRIVATE HEALTH INSURANCE | Attending: Student in an Organized Health Care Education/Training Program | Primary: Internal Medicine

## 2020-09-18 DIAGNOSIS — M109 Gout, unspecified: Secondary | ICD-10-CM

## 2020-09-18 NOTE — Progress Notes
[facility] NoteSubjective: Brett [name], is a 65 y.o. male with PMH of  2nd degree AV block and RBBB s/p PPM, gout, erectile dysfunction, hypertension, current smoker here for follow up visit today.# hypertension- blood pressure has been well controlled with home readings of systolic 120s to 322G and diastolic 70s to 80s- current taking chlorthalidone 25 mg daily- no lightheadedness, dizziness, weakness# smoking- currently only smoking 4-5 cigarettes daily- has been cutting down with the help of nicotine gum and patch# gout- had flare 3 weeks ago- currently well controlled URK:[id number]: No fevers or chills, Resp: No shortness of breath, Cardiovascular: No chest pain or palpitations, Abdominal: No abdominal pain, nausea or vomiting, Extremities: No edema, Skin: No bruising, Neuro: No tremor or neurologic complaints, Psych: No mood complaints or disturbances, Heme: No unusual bleeding. Endocrine: No heat or cold intolerance, weight gain/loss, neck swelling, fatigue, constipation/diarrhea Past Medical History Patient has a past medical history of gout, hypertension, erectile dysfunction, 2nd degree AV block with pacemaker. Past Surgical History Patient  has a past surgical history that includes Pacemaker insertion; Hernia repair; and Colonoscopy w/ or w/o biopsy (06/21/2020).Family History family history is not on file.Social History Patient  reports that he has been smoking cigarettes. He has a 12.75 pack-year smoking history. He has never used smokeless tobacco. He reports current alcohol use of about 5.0 standard drinks of alcohol per week. He reports previous drug use.Allergies Patient has No Known Allergies.Outpatient MedicationsCurrent Outpatient Medications Medication Sig ? allopurinoL (ZYLOPRIM) 300 mg tablet Take 1 tablet (300 mg total) by mouth daily. ? chlorthalidone (HYGROTEN) 25 mg tablet Take 1 tablet (25 mg total) by mouth daily. ? colchicine (COLCRYS) 0.6 mg tablet Take 1 tablet (0.6 mg total) by mouth 2 (two) times daily. ? indomethacin (INDOCIN) 25 mg capsule TAKE 1 CAPSULE(25 MG) BY MOUTH DAILY AS NEEDED ? Miscellaneous Medical Supply (BLOOD PRESSURE CUFF) BP cuff. Medium. ICD-10: I10. Lifetime use. ? nicotine (NICODERM CQ) 14 mg transdermal patch Place 1 patch (14 mg total) onto the skin daily. Alternate arms/sites when applying patch. Discard old patch when applying new one ? predniSONE (DELTASONE) 5 mg tablet Take 5mg  daily for 3-4 days only during an acute gout attack. ? tadalafiL (CIALIS) 20 mg tablet Take 1 tablet (20 mg total) by mouth daily as needed for erectile dysfunction. No current facility-administered medications for this visit. Objective: Physical exam BP 131/70 (Site: l a, Position: Sitting, Cuff Size: Large)  - Pulse (!) 109  - Temp 97.7 ?F (36.5 ?C) (No-touch scanner)  - Ht 6' (1.829 m)  - Wt 86.2 kg  - BMI 25.77 kg/m? Wt Readings from Last 3 Encounters: 09/17/20 86.2 kg 06/21/20 88 kg 06/07/20 88.1 kg General: No apparent distress, awake, alertHEENT: NC/AT, PERRL, no pharyngeal exudate, MMM, no LAD, no thyromegalyRespiratory: CTAB, no wheeze/rales, No accessory respiratory muscle useCardiovascular: RRR, normal S1S2, no MRG, no carotid bruits, no JVD, peripheral pulses intactGastrointestinal: Soft abdomen, non-tender, non-distended, normal bowel sounds, no organomegaly, no scarsNeurological: AAOx3, No focal sensory or motor deficits.MSK/Skin: No LE edema, No clubbing nor cyanosisStudies: Labs:The results of recent testing and lab work were reviewed with today's visit.Comprehensive Chemistry Panel  Chemistry  Lab Results Component Value Date  NA 139 01/19/2020  K 3.8 01/19/2020  CL 102 01/19/2020  CO2 28 01/19/2020  BUN 13 01/19/2020  CREATININE 1.13 01/19/2020  GLU 99 01/19/2020  Lab Results Component Value Date  CALCIUM 9.3 01/19/2020  ALKPHOS 109 01/19/2020  AST 30 01/19/2020  ALT 23 01/19/2020  BILITOT 0.4 01/19/2020   Lab Results Component Value Date  HGBA1C 5.5 01/19/2020  HGBA1C 5.6 05/22/2019 No results found for requested labs within last 7 days. ImagingNo results found.ZOX:WRUEAVW for orders placed or performed in visit on 07/29/19 EKG - Performed during office visit)  Narrative  See MD notes for interpretation Assessment and Plan: Brett [name], is a 65 y.o. male with the following problems:# Hypertension- continue chlorthalidone 25 mg daily- obtain CMP today# Smoking- patient is trying to quit smoking, currently using nicotine gum and 14 mg nicotine patch- counseled on proper use of nicotine gum- offered medications to help control cravings and quit smoking, denied at this point # Healthcare maintenance- will obtain lipid panel, CBC, CMP, uric acid levels today- most recent colonoscopy 05/2020 with several polyps removed, pathology positive for tubular adenomatous polyps, next colonoscopy in 3 yrs based on these results # Gout with recent flare- follows with rheumatology - last flare 3 weeks ago, currently no pain- will obtain uric acid levels today Brett [name] was seen today for follow-up.Diagnoses and all orders for this visit:Healthcare maintenance-     Comprehensive metabolic panel; Future-     CBC and differential; Future-     Uric acid; Future-     Lipid panel; FutureGout, unspecified cause, unspecified chronicity, unspecified site-     Uric acid; FutureHypertension, unspecified type-     Comprehensive metabolic panel; FutureHealth maintenance Denied all due HCM, will revisit at next appointment Health Maintenance Topic Date Due ? Lung Cancer Screening  Never done ? HIV screening  Never done ? Hepatitis C screening  Never done ? Pneumo Vaccine 19-64 Medium Risk (1 of 1 - PPSV23) Never done ? Tetanus adult (Td q 10,TDAP once)  Never done ? Shingles vaccine (Shingrix) (1 of 2 - Shingrix (RZV) 2 Dose Standard Series) Never done ? Covid-19 vaccine series (3 - Booster for Moderna series) 10/10/2019 ? Influenza vaccine  09/30/2020 ? Diabetes screening  01/19/2023 ? Lipid disorder screening  05/21/2024 ? Colon cancer screening, Colonoscopy  06/22/2030 ? Colon cancer screening,FIT DNA (Cologuard)  Discontinued ImmunizationImmunization History Administered Date(s) Administered ? COVID-19, MODERNA 12Y+, 0.5 mL 04/12/2019, 05/10/2019 ? Influenza, injectable, quadrivalent, preservative free 11/13/2019 Follow-Up: Return in about 3 months (around 12/18/2020).Case and plan discussed with Dr Particia Lather MD, attending addendum to follow. Signed: Leonor Liv, MDY[facility]8/19/2022 at 3:01 PM

## 2020-09-20 MED ORDER — ALLOPURINOL 300 MG TABLET
300 mg | ORAL_TABLET | 2 refills | Status: AC
Start: 2020-09-20 — End: 2020-12-20

## 2020-10-08 ENCOUNTER — Encounter
Admit: 2020-10-08 | Payer: PRIVATE HEALTH INSURANCE | Attending: Student in an Organized Health Care Education/Training Program | Primary: Internal Medicine

## 2020-10-08 DIAGNOSIS — M109 Gout, unspecified: Secondary | ICD-10-CM

## 2020-10-10 MED ORDER — INDOMETHACIN 25 MG CAPSULE
25 mg | ORAL_CAPSULE | 1 refills | Status: AC
Start: 2020-10-10 — End: ?

## 2020-10-21 ENCOUNTER — Encounter: Admit: 2020-10-21 | Payer: PRIVATE HEALTH INSURANCE | Attending: Internal Medicine | Primary: Internal Medicine

## 2020-10-21 ENCOUNTER — Telehealth: Admit: 2020-10-21 | Payer: PRIVATE HEALTH INSURANCE | Attending: Internal Medicine | Primary: Internal Medicine

## 2020-10-22 NOTE — Telephone Encounter
Patient has outstanding imaging orders. Mailed reminder letter for scheduling.

## 2020-12-09 ENCOUNTER — Encounter: Admit: 2020-12-09 | Payer: PRIVATE HEALTH INSURANCE | Attending: Internal Medicine | Primary: Internal Medicine

## 2020-12-09 ENCOUNTER — Ambulatory Visit: Admit: 2020-12-09 | Payer: BLUE CROSS/BLUE SHIELD | Attending: Internal Medicine | Primary: Internal Medicine

## 2020-12-09 DIAGNOSIS — I10 Essential (primary) hypertension: Secondary | ICD-10-CM

## 2020-12-09 DIAGNOSIS — M79604 Pain in right leg: Secondary | ICD-10-CM

## 2020-12-09 DIAGNOSIS — M109 Gout, unspecified: Secondary | ICD-10-CM

## 2020-12-09 DIAGNOSIS — N529 Male erectile dysfunction, unspecified: Secondary | ICD-10-CM

## 2020-12-09 DIAGNOSIS — I441 Atrioventricular block, second degree: Secondary | ICD-10-CM

## 2020-12-09 DIAGNOSIS — H9203 Otalgia, bilateral: Secondary | ICD-10-CM

## 2020-12-09 DIAGNOSIS — N179 Acute kidney failure, unspecified: Secondary | ICD-10-CM

## 2020-12-09 DIAGNOSIS — Z0000 Encounter for general adult medical examination without abnormal findings: Secondary | ICD-10-CM

## 2020-12-09 MED ORDER — CHLORTHALIDONE 25 MG TABLET
25 mg | ORAL_TABLET | Freq: Every day | ORAL | 12 refills | Status: AC
Start: 2020-12-09 — End: 2021-01-20

## 2020-12-09 MED ORDER — CARBAMIDE PEROXIDE 6.5 % EAR DROPS
6.5 % | Freq: Four times a day (QID) | OTIC | 3 refills | Status: AC
Start: 2020-12-09 — End: ?

## 2020-12-09 MED ORDER — COLCHICINE 0.6 MG TABLET
0.6 mg | ORAL_TABLET | Freq: Two times a day (BID) | ORAL | 4 refills | Status: AC
Start: 2020-12-09 — End: 2021-01-20

## 2020-12-09 NOTE — Patient Instructions
Get blood todayUse debrox 5drops, 4 times a dayReturn in 1 monthGet cholesterol levels drawn while fasting when possible

## 2020-12-10 DIAGNOSIS — I10 Essential (primary) hypertension: Secondary | ICD-10-CM

## 2020-12-10 NOTE — Progress Notes
[facility] NoteSubjective: Brett [name], is a 65 y.o. 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, gout, erectile dysfunction, hypertension, current smoker here for follow up visit today.# hypertension- home readings are continuously in the 120s/130s systolic range- continues taking chlorthalidone daily# bilateral ear pain- has been feeling discomfort in both ears, right more than left for 1 month- feels like there is fluid in the ear whenever there is air blowing on it- saw a nurse practitioner via the video visit previously for this, who recommended in-person evaluation by a provider due to the need of an otoscopic exam- no discharge, dizziness right now, hearing loss, tinnitus# right leg pain- has had random and episodic posterior calf cramping that goes up to right inguinal area while driving and laying down in bed at night- improves on its own, often with movement- has never had this before- no changes in strength or sensation ZOX:WRUEAVW: No fevers or chills, Resp: No shortness of breath, Cardiovascular: No chest pain or palpitations, Abdominal: No abdominal pain, nausea or vomiting, Extremities: No edema, Skin: No bruising, Neuro: No tremor or neurologic complaints, Psych: No mood complaints or disturbances, Heme: No unusual bleeding. Endocrine: No heat or cold intolerance, weight gain/loss, neck swelling, fatigue, constipation/diarrhea Past Medical History Patient  has a past medical history of 2nd degree AV block, Erectile dysfunction, Gout, and Hypertension (03/2020).Past Surgical History Patient  has a past surgical history that includes Pacemaker insertion; Hernia repair; and Colonoscopy w/ or w/o biopsy (06/21/2020).Family History family history is not on file.Social History Patient  reports that he has been smoking cigarettes. He has a 12.75 pack-year smoking history. He has never used smokeless tobacco. He reports current alcohol use of about 5.0 standard drinks of alcohol per week. He reports previous drug use.Allergies Patient has No Known Allergies.Outpatient MedicationsCurrent Outpatient Medications Medication Sig ? allopurinoL (ZYLOPRIM) 300 mg tablet TAKE 1 TABLET(300 MG) BY MOUTH DAILY ? chlorthalidone (HYGROTEN) 25 mg tablet Take 1 tablet (25 mg total) by mouth daily. ? colchicine (COLCRYS) 0.6 mg tablet Take 1 tablet (0.6 mg total) by mouth 2 (two) times daily. ? indomethacin (INDOCIN) 25 mg capsule TAKE 1 CAPSULE(25 MG) BY MOUTH DAILY AS NEEDED ? Miscellaneous Medical Supply (BLOOD PRESSURE CUFF) BP cuff. Medium. ICD-10: I10. Lifetime use. ? nicotine (NICODERM CQ) 14 mg transdermal patch Place 1 patch (14 mg total) onto the skin daily. Alternate arms/sites when applying patch. Discard old patch when applying new one ? predniSONE (DELTASONE) 5 mg tablet Take 5mg  daily for 3-4 days only during an acute gout attack. (Patient not taking: Reported on 11/12/2020) ? tadalafiL (CIALIS) 20 mg tablet Take 1 tablet (20 mg total) by mouth daily as needed for erectile dysfunction. No current facility-administered medications for this visit. Objective: Physical exam There were no vitals taken for this visit.Wt Readings from Last 3 Encounters: 11/12/20 87.1 kg 09/17/20 86.2 kg 06/21/20 88 kg General: No apparent distress, awake, alertHEENT: NC/AT, PERRL, no pharyngeal exudate, MMM, no LAD, no thyromegaly.  Otoscopic exam limited by extensive cerumen impaction, bilateral tympanic membranes appear intact and grayRespiratory: CTAB, no wheeze/rales, No accessory respiratory muscle useCardiovascular: RRR, normal S1S2, no MRG, no carotid bruits, no JVD, peripheral pulses intactGastrointestinal: Soft abdomen, non-tender, non-distended, normal bowel sounds, no organomegaly, no scarsNeurological: AAOx3, No focal sensory or motor deficits.MSK/Skin: No LE edema, No clubbing nor cyanosis.  No tenderness to palpation of bilateral legsStudies: Labs:The results of recent testing and lab work were reviewed with today's visit.Comprehensive  Chemistry Panel  Chemistry  Lab Results Component Value Date  NA 137 09/17/2020  K 3.5 09/17/2020  CL 99 09/17/2020  CO2 26 09/17/2020  BUN 21 09/17/2020  CREATININE 1.37 (H) 09/17/2020  GLU 124 (H) 09/17/2020  Lab Results Component Value Date  CALCIUM 9.9 09/17/2020  ALKPHOS 153 (H) 09/17/2020  AST 33 09/17/2020  ALT 24 09/17/2020  BILITOT 0.4 09/17/2020   Lab Results Component Value Date  HGBA1C 5.5 01/19/2020  HGBA1C 5.6 05/22/2019 No results found for requested labs within last 7 days. ImagingNo results found.ZOX:WRUEAVW for orders placed or performed in visit on 07/29/19 EKG - Performed during office visit)  Narrative  See MD notes for interpretation Assessment and Plan: Artavious Jamall [name], is a 65 y.o. male with the following problems:# bilateral ear pain- likely secondary to extensive cerumen impaction- prescribed Debrox, 5 drops in the ears 4 times a day- follow-up in 1 month, may need ENT referral if symptoms do not improve or worsen# right leg cramping- will check CMP/Mg/Phos to rule out electrolyte abnormalities such as hypokalemia, hypomagnesemia, hypocalcemia, especially considering that he is on chlorthalidone in his last potassium was borderline low- no other concern for neurologic or MSK disorder# hypertension- continue chlorthalidone daily for now- BP well controlled today, 126/76# aki - last CMP in August showed creatinine of 1.37 up from baseline of 1.0-1.2- obtain new CMP, UA# high TG on last lipid panel - last lipid panel nonfasting- obtain fasting lipid panel# smoking - continue using nicotine patch and gum to help with smoking cessation, is currently smoking 4 cigarettes a day# gout- continue current treatment with daily colchicine, allopurinol# healthcare maintenance - fasting lipid panelHealth maintenance CMP, UA, fasting lipid panelHealth Maintenance Topic Date Due ? Lung Cancer Screening  Never done ? HIV screening  Never done ? Hepatitis C screening  Never done ? Pneumo Vaccine 19-64 Medium Risk (1 of 1 - PPSV23) Never done ? Tetanus adult (Td q 10,TDAP once)  Never done ? Shingles vaccine (Shingrix) (1 of 2 - Shingrix (RZV) 2 Dose Standard Series) Never done ? Covid-19 vaccine series (3 - Booster for Moderna series) 07/05/2019 ? Influenza vaccine  08/30/2020 ? Diabetes screening  09/18/2023 ? Lipid disorder screening  09/17/2025 ? Colon cancer screening, Colonoscopy  06/22/2030 ? Colon cancer screening,FIT DNA (Cologuard)  Discontinued ImmunizationImmunization History Administered Date(s) Administered ? COVID-19, MODERNA 12Y+, 0.5 mL 04/12/2019, 05/10/2019 ? Influenza, injectable, quadrivalent, preservative free 11/13/2019 Follow-Up: Return in about 6 weeks (around 01/17/2021).Case and plan discussed with Dr Particia Lather MD, attending addendum to follow. Signed: Leonor Liv, MD[facility]11/10/2020 at 3:24 PM

## 2020-12-20 ENCOUNTER — Encounter: Admit: 2020-12-20 | Payer: PRIVATE HEALTH INSURANCE | Attending: Internal Medicine | Primary: Internal Medicine

## 2020-12-20 ENCOUNTER — Inpatient Hospital Stay: Admit: 2020-12-20 | Discharge: 2020-12-20 | Payer: BLUE CROSS/BLUE SHIELD | Primary: Internal Medicine

## 2020-12-20 ENCOUNTER — Encounter
Admit: 2020-12-20 | Payer: PRIVATE HEALTH INSURANCE | Attending: Student in an Organized Health Care Education/Training Program | Primary: Internal Medicine

## 2020-12-20 DIAGNOSIS — N179 Acute kidney failure, unspecified: Secondary | ICD-10-CM

## 2020-12-20 DIAGNOSIS — Z0000 Encounter for general adult medical examination without abnormal findings: Secondary | ICD-10-CM

## 2020-12-20 DIAGNOSIS — N529 Male erectile dysfunction, unspecified: Secondary | ICD-10-CM

## 2020-12-20 DIAGNOSIS — M79604 Pain in right leg: Secondary | ICD-10-CM

## 2020-12-20 LAB — COMPREHENSIVE METABOLIC PANEL
BKR A/G RATIO: 1.7 (ref 1.0–2.2)
BKR ALANINE AMINOTRANSFERASE (ALT): 23 U/L (ref 9–59)
BKR ALBUMIN: 4.5 g/dL (ref 3.6–4.9)
BKR ALKALINE PHOSPHATASE: 100 U/L (ref 9–122)
BKR ANION GAP: 9 (ref 7–17)
BKR ASPARTATE AMINOTRANSFERASE (AST): 32 U/L (ref 10–35)
BKR AST/ALT RATIO: 1.4
BKR BILIRUBIN TOTAL: 0.5 mg/dL (ref <=1.2)
BKR BILIRUBIN, UA: 141 mmol/L (ref 136–144)
BKR BLOOD UREA NITROGEN: 14 mg/dL (ref 8–23)
BKR BUN / CREAT RATIO: 13.1 (ref 8.0–23.0)
BKR CALCIUM: 9.5 mg/dL (ref 8.8–10.2)
BKR CHLORIDE: 101 mmol/L (ref 98–107)
BKR CO2: 31 mmol/L — ABNORMAL HIGH (ref 20–30)
BKR CREATININE: 1.07 mg/dL (ref 0.40–1.30)
BKR EGFR, CREATININE (CKD-EPI 2021): >60 mL/min/{1.73_m2} (ref >=60)
BKR GLOBULIN: 2.7 g/dL (ref 2.3–3.5)
BKR GLUCOSE: 71 mg/dL (ref 70–100)
BKR POTASSIUM: 3.9 mmol/L (ref 3.3–5.3)
BKR PROTEIN TOTAL: 7.2 g/dL (ref 6.6–8.7)
BKR SODIUM: 141 mmol/L (ref 136–144)

## 2020-12-20 LAB — URINALYSIS-MACROSCOPIC W/REFLEX MICROSCOPIC
BKR BLOOD, UA: NEGATIVE
BKR KETONES, UA: NEGATIVE
BKR LEUKOCYTE ESTERASE, UA: NEGATIVE mmol/L (ref 3.3–5.3)
BKR NITRITE, UA: NEGATIVE mmol/L (ref 98–107)
BKR PH, UA: 6 (ref 5.5–7.5)
BKR PROTEIN, UA: NEGATIVE
BKR SPECIFIC GRAVITY, UA: 1.026 (ref 1.005–1.030)
BKR UROBILINOGEN, UA (MG/DL): <2 mg/dL — ABNORMAL HIGH (ref 20–<=2.0)

## 2020-12-20 LAB — LIPID PANEL
BKR CHOLESTEROL/HDL RATIO: 3.5 (ref 0.0–5.0)
BKR CHOLESTEROL: 186 mg/dL
BKR GLUCOSE, UA: 3.5 x 1000/??L (ref 0.0–5.0)
BKR HDL CHOLESTEROL: 53 mg/dL (ref >=40)
BKR LDL CHOLESTEROL SAMPSON CALCULATED: 90 mg/dL
BKR TRIGLYCERIDES: 258 mg/dL — ABNORMAL HIGH
BKR WAM MONOCYTE ABSOLUTE COUNT.: 186 mg/dL (ref 1.005–1.030)

## 2020-12-20 LAB — MAGNESIUM: BKR MAGNESIUM: 1.9 mg/dL — ABNORMAL HIGH (ref 1.7–2.4)

## 2020-12-20 MED ORDER — ALLOPURINOL 300 MG TABLET
300 | ORAL_TABLET | Freq: Every day | ORAL | 1 refills | 90.00000 days | Status: AC
Start: 2020-12-20 — End: 2021-01-20

## 2021-01-20 ENCOUNTER — Encounter: Admit: 2021-01-20 | Payer: PRIVATE HEALTH INSURANCE | Attending: Internal Medicine | Primary: Internal Medicine

## 2021-01-20 ENCOUNTER — Ambulatory Visit: Admit: 2021-01-20 | Payer: BLUE CROSS/BLUE SHIELD | Attending: Internal Medicine | Primary: Internal Medicine

## 2021-01-20 DIAGNOSIS — F17200 Nicotine dependence, unspecified, uncomplicated: Secondary | ICD-10-CM

## 2021-01-20 DIAGNOSIS — I10 Essential (primary) hypertension: Secondary | ICD-10-CM

## 2021-01-20 DIAGNOSIS — N529 Male erectile dysfunction, unspecified: Secondary | ICD-10-CM

## 2021-01-20 DIAGNOSIS — M109 Gout, unspecified: Secondary | ICD-10-CM

## 2021-01-20 DIAGNOSIS — I441 Atrioventricular block, second degree: Secondary | ICD-10-CM

## 2021-01-20 MED ORDER — COLCHICINE 0.6 MG TABLET
0.6 mg | ORAL_TABLET | Freq: Two times a day (BID) | ORAL | 4 refills | Status: AC
Start: 2021-01-20 — End: 2021-07-04

## 2021-01-20 MED ORDER — CHLORTHALIDONE 25 MG TABLET
25 mg | ORAL_TABLET | Freq: Every day | ORAL | 12 refills | Status: AC
Start: 2021-01-20 — End: 2021-07-04

## 2021-01-20 MED ORDER — ALLOPURINOL 300 MG TABLET
300 mg | ORAL_TABLET | Freq: Every day | ORAL | 1 refills | Status: AC
Start: 2021-01-20 — End: 2021-02-25

## 2021-01-20 MED ORDER — NICOTINE (POLACRILEX) 4 MG GUM
4 mg | ORAL | 4 refills | Status: AC | PRN
Start: 2021-01-20 — End: ?

## 2021-01-20 MED ORDER — TADALAFIL 20 MG TABLET
20 mg | ORAL_TABLET | Freq: Every day | ORAL | 3 refills | Status: AC | PRN
Start: 2021-01-20 — End: 2021-07-04

## 2021-01-20 NOTE — Progress Notes
[facility] NoteSubjective: Brett [name], is a 65 y.o. 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, gout, erectile dysfunction, hypertension, current smoker here for follow up visit today.# hypertension- home readings are continuously in the 120s/130s systolic range- continues taking chlorthalidone daily# bilateral ear pain- better now with debrox drops # right leg pain- has resolved# erectile dysfunction- not having morning erections regularly- still having some difficulty with sustaining erections on cialis 20mg  PRN- elevated TG on labs, concerning for PAD/CAD but is able to tolerate exercise and going up 4 flights of stairs without being short of breath or having chest pain# hypertriglyceridemia- TG 258- is hesitant to start medication nowThe 10-year ASCVD risk score (Arnett DK, et al., 2019) is: 26.7%  Values used to calculate the score:    Age: 35 years    Sex: Male    Is Non-Hispanic African American: Yes    Diabetic: No    Tobacco smoker: Yes    Systolic Blood Pressure: 135 mmHg    Is BP treated: Yes    HDL Cholesterol: 53 mg/dL    Total Cholesterol: 161 mg/dLROS:General: No fevers or chills, Resp: No shortness of breath, Cardiovascular: No chest pain or palpitations, Abdominal: No abdominal pain, nausea or vomiting, Extremities: No edema, Skin: No bruising, Neuro: No tremor or neurologic complaints, Psych: No mood complaints or disturbances, Heme: No unusual bleeding. Endocrine: No heat or cold intolerance, weight gain/loss, neck swelling, fatigue, constipation/diarrhea Past Medical History Patient  has a past medical history of 2nd degree AV block, Erectile dysfunction, Gout, and Hypertension (03/2020).Past Surgical History Patient  has a past surgical history that includes Pacemaker insertion; Hernia repair; and Colonoscopy w/ or w/o biopsy (06/21/2020).Family History family history is not on file.Social History Patient  reports that he has been smoking cigarettes. He has a 12.75 pack-year smoking history. He has never used smokeless tobacco. He reports current alcohol use of about 5.0 standard drinks per week. He reports that he does not currently use drugs.Allergies Patient has No Known Allergies.Outpatient MedicationsCurrent Outpatient Medications Medication Sig ? allopurinoL (ZYLOPRIM) 300 mg tablet Take 1 tablet (300 mg total) by mouth daily. The patient needs to keep f/u appointment on 01/20/21 for further refills ? chlorthalidone (HYGROTEN) 25 mg tablet Take 1 tablet (25 mg total) by mouth daily. ? colchicine (COLCRYS) 0.6 mg tablet Take 1 tablet (0.6 mg total) by mouth 2 (two) times daily. ? indomethacin (INDOCIN) 25 mg capsule TAKE 1 CAPSULE(25 MG) BY MOUTH DAILY AS NEEDED ? Miscellaneous Medical Supply (BLOOD PRESSURE CUFF) BP cuff. Medium. ICD-10: I10. Lifetime use. ? nicotine (NICODERM CQ) 14 mg transdermal patch Place 1 patch (14 mg total) onto the skin daily. Alternate arms/sites when applying patch. Discard old patch when applying new one ? predniSONE (DELTASONE) 5 mg tablet Take 5mg  daily for 3-4 days only during an acute gout attack. (Patient not taking: Reported on 11/12/2020) ? tadalafiL (CIALIS) 20 mg tablet Take 1 tablet (20 mg total) by mouth daily as needed for erectile dysfunction. No current facility-administered medications for this visit. Objective: Physical exam There were no vitals taken for this visit.Wt Readings from Last 3 Encounters: 12/09/20 83.9 kg 11/12/20 87.1 kg 09/17/20 86.2 kg General: No apparent distress, awake, alertHEENT: NC/AT, PERRL, no pharyngeal exudate, MMM, no LAD, no thyromegaly.  Otoscopic exam limited by extensive cerumen impaction, bilateral tympanic membranes appear intact and grayRespiratory: CTAB, no wheeze/rales, No accessory respiratory muscle useCardiovascular: RRR, normal S1S2, no MRG, no carotid bruits,  no JVD, peripheral pulses intactGastrointestinal: Soft abdomen, non-tender, non-distended, normal bowel sounds, no organomegaly, no scarsNeurological: AAOx3, No focal sensory or motor deficits.MSK/Skin: No LE edema, No clubbing nor cyanosis.  No tenderness to palpation of bilateral legsStudies: Labs:The results of recent testing and lab work were reviewed with today's visit.Comprehensive Chemistry Panel  Chemistry  Lab Results Component Value Date  NA 141 12/20/2020  K 3.9 12/20/2020  CL 101 12/20/2020  CO2 31 (H) 12/20/2020  BUN 14 12/20/2020  CREATININE 1.07 12/20/2020  GLU 71 12/20/2020  Lab Results Component Value Date  CALCIUM 9.5 12/20/2020  ALKPHOS 100 12/20/2020  AST 32 12/20/2020  ALT 23 12/20/2020  BILITOT 0.5 12/20/2020   Lab Results Component Value Date  HGBA1C 5.5 01/19/2020  HGBA1C 5.6 05/22/2019 No results found for requested labs within last 7 days. ImagingNo results found.ZOX:WRUEAVW for orders placed or performed in visit on 07/29/19 EKG - Performed during office visit)  Narrative  See MD notes for interpretation Assessment and Plan: Brett [name], is a 65 y.o. male with the following problems:# hypertension- continue chlorthalidone daily for now- continue home BP checks# erectile dysfunction- PHQ2 - 0 - high TG - on Cialis and cannot sustain erections, no morning erections- obtain TSH, morning testosterone, PSA, A1c, EKG, urology referral- no need for stress test vs coronary calcium scoring at this point due to high exercise tolerance in daily life (walks an average of 10,000 steps daily, can climb 4 flights of stairs without stopping)# hypertriglyceridemia - ASCVD 26.7%- would like to change lifestyle prior to starting medications (will change diet, cut down on smoking)# smoking - continue using nicotine patch and gum to help with smoking cessation, is currently smoking 3 cigarettes a day, down from 10# gout- continue current treatment with daily colchicine, allopurinol# healthcare maintenance - HIV, Hep C testing Health maintenance HIV, Hep C, A1c, PSAHealth Maintenance Topic Date Due ? Lung Cancer Screening  Never done ? HIV screening  Never done ? Hepatitis C screening  Never done ? Pneumo Vaccine 19-64 Medium Risk (1 of 1 - PPSV23) Never done ? Tetanus adult (Td q 10,TDAP once)  Never done ? Shingles vaccine (Shingrix) (1 of 2 - Shingrix (RZV) 2 Dose Standard Series) Never done ? Covid-19 vaccine series (3 - Booster for Moderna series) 07/05/2019 ? Influenza vaccine  08/30/2020 ? Diabetes screening  12/21/2023 ? Lipid disorder screening  12/20/2025 ? Colon cancer screening, Colonoscopy  06/22/2030 ? Colon cancer screening,FIT DNA (Cologuard)  Discontinued ImmunizationImmunization History Administered Date(s) Administered ? COVID-19, MODERNA 12Y+, 0.5 mL 04/12/2019, 05/10/2019 ? Influenza, injectable, quadrivalent, preservative free 11/13/2019 Follow-Up: Return in about 2 months (around 03/23/2021).Case and plan discussed with Dr Rosezetta Schlatter MD, attending addendum to follow. Signed: Leonor Liv, MDY[facility]12/22/2022 at 3:24 PM

## 2021-01-20 NOTE — Patient Instructions
Blood work todayFollow up with urologyFollow up with [place] in 2 months

## 2021-01-21 DIAGNOSIS — N529 Male erectile dysfunction, unspecified: Secondary | ICD-10-CM

## 2021-01-21 DIAGNOSIS — Z0000 Encounter for general adult medical examination without abnormal findings: Secondary | ICD-10-CM

## 2021-01-21 DIAGNOSIS — M109 Gout, unspecified: Secondary | ICD-10-CM

## 2021-02-01 ENCOUNTER — Inpatient Hospital Stay: Admit: 2021-02-01 | Discharge: 2021-02-01 | Payer: BLUE CROSS/BLUE SHIELD | Primary: Internal Medicine

## 2021-02-01 ENCOUNTER — Encounter
Admit: 2021-02-01 | Payer: PRIVATE HEALTH INSURANCE | Attending: Student in an Organized Health Care Education/Training Program | Primary: Internal Medicine

## 2021-02-01 ENCOUNTER — Encounter: Admit: 2021-02-01 | Payer: PRIVATE HEALTH INSURANCE | Primary: Internal Medicine

## 2021-02-01 ENCOUNTER — Ambulatory Visit: Admit: 2021-02-01 | Payer: BLUE CROSS/BLUE SHIELD | Primary: Internal Medicine

## 2021-02-01 DIAGNOSIS — Z0000 Encounter for general adult medical examination without abnormal findings: Secondary | ICD-10-CM

## 2021-02-01 DIAGNOSIS — N529 Male erectile dysfunction, unspecified: Secondary | ICD-10-CM

## 2021-02-01 DIAGNOSIS — I10 Essential (primary) hypertension: Secondary | ICD-10-CM

## 2021-02-01 DIAGNOSIS — I441 Atrioventricular block, second degree: Secondary | ICD-10-CM

## 2021-02-01 DIAGNOSIS — Z23 Encounter for immunization: Secondary | ICD-10-CM

## 2021-02-01 DIAGNOSIS — M109 Gout, unspecified: Secondary | ICD-10-CM

## 2021-02-01 LAB — HEPATITIS C AB WITH REFLEX TO HCV PCR
BKR HEPATITIS C ANTIBODY INITIAL RESULT: 0.1 S/CO
BKR HEPATITIS C ANTIBODY INTERPRETATION: NONREACTIVE (ref 1.005–1.030)

## 2021-02-01 LAB — TSH W/REFLEX TO FT4     (BH GH LMW Q YH): BKR THYROID STIMULATING HORMONE: 1.82 ??IU/mL

## 2021-02-01 LAB — HIV-1/HIV-2 ANTIBODY/ANTIGEN SCREEN W/REFLEX     (BH GH LMW YH): BKR HIV 1/2 ANTIBODY SCREEN (BH): NONREACTIVE

## 2021-02-01 LAB — HEMOGLOBIN A1C
BKR ESTIMATED AVERAGE GLUCOSE: 111 mg/dL
BKR HEMOGLOBIN A1C: 5.5 % (ref 4.0–5.6)

## 2021-02-04 ENCOUNTER — Encounter: Admit: 2021-02-04 | Payer: PRIVATE HEALTH INSURANCE | Attending: Cardiovascular Disease | Primary: Internal Medicine

## 2021-02-04 ENCOUNTER — Encounter: Admit: 2021-02-04 | Payer: BLUE CROSS/BLUE SHIELD | Attending: Cardiovascular Disease | Primary: Internal Medicine

## 2021-02-05 LAB — PSA, TOTAL AND FREE: PROSTATE SPECIFIC ANTIGEN, TOTAL (BH): 0.5 ng/mL (ref <=4.0)

## 2021-02-09 LAB — TESTOSTERONE, FREE, BIO AND TOTAL, LC/MS/MS    (BH GH LMW Q YH)
ALBUMIN, SERUM: 4.3 g/dL (ref 3.6–5.1)
PROSTATE SPECIFIC ANTIGEN, FREE (BH Q): 41 nmol/L (ref 22–77)
SEX HORMONE BINDING GLOBULIN: 41 nmol/L (ref 22–77)
TESTOSTERONE FREE: 45.5 pg/mL — ABNORMAL LOW (ref 46.0–224.0)
TESTOSTERONE TOTAL LC/MS/MS: 412 ng/dL (ref 250–1100)
TESTOSTERONE, BIOAVAILABLE: 89.6 ng/dL — ABNORMAL LOW (ref 110.0–575.0)

## 2021-02-21 ENCOUNTER — Encounter: Admit: 2021-02-21 | Payer: PRIVATE HEALTH INSURANCE | Attending: Cardiovascular Disease | Primary: Internal Medicine

## 2021-02-25 ENCOUNTER — Encounter: Admit: 2021-02-25 | Payer: PRIVATE HEALTH INSURANCE | Attending: Internal Medicine | Primary: Internal Medicine

## 2021-02-25 ENCOUNTER — Encounter: Admit: 2021-02-25 | Payer: PRIVATE HEALTH INSURANCE | Primary: Internal Medicine

## 2021-02-25 DIAGNOSIS — M109 Gout, unspecified: Secondary | ICD-10-CM

## 2021-02-25 DIAGNOSIS — N529 Male erectile dysfunction, unspecified: Secondary | ICD-10-CM

## 2021-02-25 MED ORDER — ALLOPURINOL 300 MG TABLET
300 mg | ORAL_TABLET | Freq: Every day | ORAL | 2 refills | Status: AC
Start: 2021-02-25 — End: 2021-06-29

## 2021-02-25 NOTE — Telephone Encounter
Request for Zyloprim notedRouted to the provider for refill.

## 2021-03-10 ENCOUNTER — Ambulatory Visit: Admit: 2021-03-10 | Payer: BLUE CROSS/BLUE SHIELD | Attending: Urology | Primary: Internal Medicine

## 2021-03-10 ENCOUNTER — Encounter: Admit: 2021-03-10 | Payer: PRIVATE HEALTH INSURANCE | Attending: Urology | Primary: Internal Medicine

## 2021-03-10 MED ORDER — TADALAFIL 5 MG TABLET
5 | ORAL_TABLET | Freq: Every day | ORAL | 12 refills | 30.00000 days | Status: AC | PRN
Start: 2021-03-10 — End: ?

## 2021-03-21 ENCOUNTER — Telehealth: Admit: 2021-03-21 | Payer: PRIVATE HEALTH INSURANCE | Attending: Urology | Primary: Internal Medicine

## 2021-04-01 ENCOUNTER — Encounter
Admit: 2021-04-01 | Payer: PRIVATE HEALTH INSURANCE | Attending: Student in an Organized Health Care Education/Training Program | Primary: Internal Medicine

## 2021-04-01 DIAGNOSIS — I10 Essential (primary) hypertension: Secondary | ICD-10-CM

## 2021-04-04 ENCOUNTER — Encounter: Admit: 2021-04-04 | Payer: PRIVATE HEALTH INSURANCE | Attending: Internal Medicine | Primary: Internal Medicine

## 2021-04-04 DIAGNOSIS — I10 Essential (primary) hypertension: Secondary | ICD-10-CM

## 2021-05-10 ENCOUNTER — Encounter: Admit: 2021-05-10 | Payer: BLUE CROSS/BLUE SHIELD | Attending: Cardiovascular Disease | Primary: Internal Medicine

## 2021-05-10 ENCOUNTER — Encounter: Admit: 2021-05-10 | Payer: PRIVATE HEALTH INSURANCE | Attending: Cardiovascular Disease | Primary: Internal Medicine

## 2021-05-16 ENCOUNTER — Encounter: Admit: 2021-05-16 | Payer: PRIVATE HEALTH INSURANCE | Primary: Internal Medicine

## 2021-05-23 ENCOUNTER — Encounter: Admit: 2021-05-23 | Payer: PRIVATE HEALTH INSURANCE | Attending: Cardiovascular Disease | Primary: Internal Medicine

## 2021-05-23 ENCOUNTER — Encounter: Admit: 2021-05-23 | Payer: PRIVATE HEALTH INSURANCE | Primary: Internal Medicine

## 2021-06-01 ENCOUNTER — Telehealth: Admit: 2021-06-01 | Payer: PRIVATE HEALTH INSURANCE | Attending: Cardiovascular Disease | Primary: Internal Medicine

## 2021-06-02 ENCOUNTER — Encounter: Admit: 2021-06-02 | Payer: PRIVATE HEALTH INSURANCE | Primary: Internal Medicine

## 2021-06-08 ENCOUNTER — Encounter: Admit: 2021-06-08 | Payer: PRIVATE HEALTH INSURANCE | Attending: Urology | Primary: Internal Medicine

## 2021-06-24 ENCOUNTER — Encounter
Admit: 2021-06-24 | Payer: PRIVATE HEALTH INSURANCE | Attending: Student in an Organized Health Care Education/Training Program | Primary: Internal Medicine

## 2021-06-24 DIAGNOSIS — M109 Gout, unspecified: Secondary | ICD-10-CM

## 2021-06-24 NOTE — Telephone Encounter
Please schedule appt Over due last seen 12/22 and follow in 2 months Routing to clerical

## 2021-06-29 MED ORDER — ALLOPURINOL 300 MG TABLET
300 mg | ORAL_TABLET | Freq: Every day | ORAL | 1 refills | Status: AC
Start: 2021-06-29 — End: 2021-07-04

## 2021-07-04 ENCOUNTER — Encounter: Admit: 2021-07-04 | Payer: PRIVATE HEALTH INSURANCE | Attending: Internal Medicine | Primary: Internal Medicine

## 2021-07-04 ENCOUNTER — Ambulatory Visit: Admit: 2021-07-04 | Payer: PRIVATE HEALTH INSURANCE | Attending: Internal Medicine | Primary: Internal Medicine

## 2021-07-04 DIAGNOSIS — Z0000 Encounter for general adult medical examination without abnormal findings: Secondary | ICD-10-CM

## 2021-07-04 DIAGNOSIS — M109 Gout, unspecified: Secondary | ICD-10-CM

## 2021-07-04 DIAGNOSIS — H5711 Ocular pain, right eye: Secondary | ICD-10-CM

## 2021-07-04 DIAGNOSIS — I10 Essential (primary) hypertension: Secondary | ICD-10-CM

## 2021-07-04 DIAGNOSIS — F17200 Nicotine dependence, unspecified, uncomplicated: Secondary | ICD-10-CM

## 2021-07-04 DIAGNOSIS — I441 Atrioventricular block, second degree: Secondary | ICD-10-CM

## 2021-07-04 DIAGNOSIS — N529 Male erectile dysfunction, unspecified: Secondary | ICD-10-CM

## 2021-07-04 MED ORDER — NICOTINE 14 MG/24 HR DAILY TRANSDERMAL PATCH
14 mg | MEDICATED_PATCH | Freq: Every day | TRANSDERMAL | 3 refills | Status: AC
Start: 2021-07-04 — End: ?

## 2021-07-04 MED ORDER — TADALAFIL 20 MG TABLET
20 mg | ORAL_TABLET | Freq: Every day | ORAL | 3 refills | Status: AC | PRN
Start: 2021-07-04 — End: ?

## 2021-07-04 MED ORDER — NICOTINE (POLACRILEX) 4 MG GUM
4 mg | ORAL | 3 refills | Status: AC | PRN
Start: 2021-07-04 — End: ?

## 2021-07-04 MED ORDER — CHLORTHALIDONE 25 MG TABLET
25 mg | ORAL_TABLET | Freq: Every day | ORAL | 12 refills | Status: AC
Start: 2021-07-04 — End: 2021-12-13

## 2021-07-04 MED ORDER — ALLOPURINOL 300 MG TABLET
300 mg | ORAL_TABLET | Freq: Every day | ORAL | 4 refills | Status: AC
Start: 2021-07-04 — End: 2021-12-05

## 2021-07-04 MED ORDER — COLCHICINE 0.6 MG TABLET
0.6 mg | ORAL_TABLET | Freq: Two times a day (BID) | ORAL | 4 refills | Status: AC
Start: 2021-07-04 — End: 2021-12-13

## 2021-07-04 NOTE — Patient Instructions
Continue taking all medicationsKeep a headache diarySee ophthalmology Get low dose lung [place] scan when availableGet bloodwork before next visit

## 2021-07-05 DIAGNOSIS — E781 Pure hyperglyceridemia: Secondary | ICD-10-CM

## 2021-07-05 DIAGNOSIS — N529 Male erectile dysfunction, unspecified: Secondary | ICD-10-CM

## 2021-07-05 DIAGNOSIS — Z950 Presence of cardiac pacemaker: Secondary | ICD-10-CM

## 2021-07-05 DIAGNOSIS — I2510 Atherosclerotic heart disease of native coronary artery without angina pectoris: Secondary | ICD-10-CM

## 2021-07-05 DIAGNOSIS — F17210 Nicotine dependence, cigarettes, uncomplicated: Secondary | ICD-10-CM

## 2021-07-06 ENCOUNTER — Encounter: Admit: 2021-07-06 | Payer: PRIVATE HEALTH INSURANCE | Primary: Internal Medicine

## 2021-07-06 ENCOUNTER — Ambulatory Visit: Admit: 2021-07-06 | Payer: PRIVATE HEALTH INSURANCE | Attending: Ophthalmology | Primary: Internal Medicine

## 2021-07-06 DIAGNOSIS — G4485 Primary stabbing headache: Secondary | ICD-10-CM

## 2021-07-06 NOTE — Progress Notes
[facility] NoteSubjective: Brett [name], is a 66 y.o. 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, gout, erectile dysfunction, hypertension, current smoker here for follow up visit today.# hypertension- home readings are continuously in the 120s/130s systolic range- continues taking chlorthalidone daily# R eye pain with R sided headache- has been having R sided headaches for 1 month now - Over R side of forehead and temporal region, and around R eye- No discharge/tearing of right eye, no rhinorrhea, no visual changes, blurriness, dizziness, redness, phonophobia, photophobia- Has associated nausea without vomiting- Headaches onset randomly without noticeable triggering factors, 9/10, and gradually goes away on its own in 1-2 hours, improves with sleep, has not tried any medications for it # erectile dysfunction- improved with occasional Cialis use# Smoking- smoking 4 cigarettes daily- uses gum consistently, patch seldomThe 10-year ASCVD risk score (Arnett DK, et al., 2019) is: 26%  Values used to calculate the score:    Age: 83 years    Sex: Male    Is Non-Hispanic African American: Yes    Diabetic: No    Tobacco smoker: Yes    Systolic Blood Pressure: 130 mmHg    Is BP treated: Yes    HDL Cholesterol: 53 mg/dL    Total Cholesterol: 161 mg/dLROS:General: No fevers or chills, Resp: No shortness of breath, Cardiovascular: No chest pain or palpitations, Abdominal: No abdominal pain, nausea or vomiting, Extremities: No edema, Skin: No bruising, Neuro: No tremor or neurologic complaints, Psych: No mood complaints or disturbances, Heme: No unusual bleeding. Endocrine: No heat or cold intolerance, weight gain/loss, neck swelling, fatigue, constipation/diarrhea Past Medical History Patient  has a past medical history of 2nd degree AV block, Erectile dysfunction, Gout, and Hypertension (03/2020).Past Surgical History Patient  has a past surgical history that includes Pacemaker insertion; Hernia repair; and Colonoscopy w/ or w/o biopsy (06/21/2020).Family History family history is not on file.Social History Patient  reports that he has been smoking cigarettes. He has a 12.75 pack-year smoking history. He has never used smokeless tobacco. He reports current alcohol use of about 5.0 standard drinks per week. He reports that he does not use drugs.Allergies Patient has No Known Allergies.Outpatient MedicationsCurrent Outpatient Medications Medication Sig ? indomethacin (INDOCIN) 25 mg capsule TAKE 1 CAPSULE(25 MG) BY MOUTH DAILY AS NEEDED ? allopurinoL (ZYLOPRIM) 300 mg tablet Take 1 tablet (300 mg total) by mouth daily. The patient needs to keep f/u appointment on 01/20/21 for further refills ? chlorthalidone (HYGROTEN) 25 mg tablet Take 1 tablet (25 mg total) by mouth daily. ? colchicine (COLCRYS) 0.6 mg tablet Take 1 tablet (0.6 mg total) by mouth 2 (two) times daily. ? Miscellaneous Medical Supply (BLOOD PRESSURE CUFF) BP cuff. Medium. ICD-10: I10. Lifetime use. ? nicotine (NICODERM CQ) 14 mg transdermal patch Place 1 patch (14 mg total) onto the skin daily. Alternate arms/sites when applying patch. Discard old patch when applying new one ? nicotine polacrilex (NICORETTE) 4 mg gum Take 1 each (4 mg total) by mouth every 2 (two) hours as needed for smoking cessation. ? tadalafiL (CIALIS) 20 mg tablet Take 1 tablet (20 mg total) by mouth daily as needed for erectile dysfunction. No current facility-administered medications for this visit. Objective: Physical exam BP 130/81 (Site: l a, Position: Sitting)  - Pulse (!) 94  - Temp 97.8 ?F (36.6 ?C) (No-touch scanner)  - Ht 6' (1.829 m)  - Wt 83.1 kg  - SpO2 96%  - BMI 24.85 kg/m? Wt Readings from  Last 3 Encounters: 07/04/21 83.1 kg 05/10/21 85.7 kg 03/10/21 86.2 kg General: No apparent distress, awake, alertHEENT: NC/AT, PERRL, no pharyngeal exudate, MMM, no LAD, no thyromegaly.  Ophthalmoscope examination limited but shows mild tortuosity of vessels in R eye. Respiratory: CTAB, no wheeze/rales, No accessory respiratory muscle useCardiovascular: RRR, normal S1S2, no MRG, no carotid bruits, no JVD, peripheral pulses intactGastrointestinal: Soft abdomen, non-tender, non-distended, normal bowel sounds, no organomegaly, no scarsNeurological: AAOx3, No focal sensory or motor deficits.MSK/Skin: No LE edema, No clubbing nor cyanosis.  No tenderness to palpation of bilateral legsStudies: Labs:The results of recent testing and lab work were reviewed with today's visit.Comprehensive Chemistry Panel  Chemistry  Lab Results Component Value Date  NA 141 12/20/2020  K 3.9 12/20/2020  CL 101 12/20/2020  CO2 31 (H) 12/20/2020  BUN 14 12/20/2020  CREATININE 1.07 12/20/2020  GLU 71 12/20/2020  Lab Results Component Value Date  CALCIUM 9.5 12/20/2020  ALKPHOS 100 12/20/2020  AST 32 12/20/2020  ALT 23 12/20/2020  BILITOT 0.5 12/20/2020   Lab Results Component Value Date  HGBA1C 5.5 02/01/2021  HGBA1C 5.5 01/19/2020  HGBA1C 5.6 05/22/2019 No results found for requested labs within last 7 days. ImagingNo results found.YNW:GNFAOZH for orders placed or performed in visit on 05/10/21 EKG  Narrative  Per [facility] Protocol please see progress note for Dr. Interpretation.  Assessment and Plan: Brett [name], is a 66 y.o. male with the following problems:# R eye pain with R sided headache- Ophthalmology referral - Can take tylenol or ibuprofen in meanwhile for pain- If any changes in vision or worsening symptoms then visit ED# hypertension- continue chlorthalidone daily for now- continue home BP checks# erectile dysfunction- on Cialis and uses occasionally # hypertriglyceridemia - ASCVD 26.7%- would like to change lifestyle prior to starting medications (will change diet, cut down on smoking)- repeat lipid panel# smoking - continue using nicotine patch and gum to help with smoking cessation, is currently smoking 3-4 cigarettes a day, down from 10# gout- continue current treatment with daily colchicine, allopurinol- repeat uric acid levels# healthcare maintenance - Low dose [place] screen, lipid panelHealth maintenance HIV, Hep C, A1c, PSAHealth Maintenance Topic Date Due ? Lung Cancer Screening  Never done ? Pneumo Vaccine 65+ (1 - PCV) Never done ? Tetanus adult (Td q 10,TDAP once)  Never done ? Shingles vaccine (Shingrix) (1 of 2 - Shingrix (RZV) 2 Dose Standard Series) Never done ? Covid-19 vaccine series (3 - Moderna series) 07/05/2019 ? Influenza vaccine  09/30/2021 ? Diabetes screening  02/02/2024 ? Lipid disorder screening  12/20/2025 ? Colon cancer screening, Colonoscopy  06/22/2030 ? Hepatitis C screening  Completed ? HIV screening  Completed ? Colon cancer screening,FIT DNA (Cologuard)  Discontinued ImmunizationImmunization History Administered Date(s) Administered ? COVID-19, MODERNA 12Y+, 0.5 mL 04/12/2019, 05/10/2019 ? Influenza, injectable, quadrivalent, preservative free 11/13/2019, 02/01/2021 Follow-Up: Return in about 1 month (around 08/03/2021).Case and plan discussed with Dr Rosezetta Schlatter MD, attending addendum to follow. Signed: Leonor Liv, MDYale [facility] Surgery [facility]6/05/2021 at 3:24 PM

## 2021-07-06 NOTE — Progress Notes
Impression:1) headaches and pressure sensation behind Right eye x one monthHas had sinus issues in pastHad tooth removed Right lower: some discomfort in area since thenDenies jaw claudication, scalp tenderness, no malaiseNo tenderness with in temporal regionNo ocular etiology to explainRecommend evaluation with medical MD for evaluation of sinuses and consideration of neurological evaluation for headachesF/u one month here for VF's, MAC oct and dilated exam

## 2021-07-08 ENCOUNTER — Telehealth: Admit: 2021-07-08 | Payer: PRIVATE HEALTH INSURANCE | Primary: Internal Medicine

## 2021-07-08 NOTE — Telephone Encounter
I would like to discuss a recent order I received for lung cancer screening from your medical doctor.  You are scheduled for this exam on 08/04/21  at 8:20 am at [facility] in [place]. First I would like to ask a few questions about your medical history. 1.	Are you a current or former smoker?  currenta.	When did you start smoking? 66yrs oldb.	When did you quit (if applicable)? Working on it, down to a few a dayc.	How much did you smoke a day most of the time? 1-2pp2.	Do you have a family history of lung cancer? NoDo you have a personal history of any other type of cancer? No3.	Have you had any other respiratory exposures (ie radon, asbestos?) No4.	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	Do you have any new symptoms like cough, weight loss, or shortness of breath? NoI would like to discuss with you the risks and benefits of lung cancer screening.Benefits: First, no one suspects that you have lung cancer. This screening was ordered solely because of your age and smoking history. Just like a colonoscopy is ordered at age 77 and a mammogram is ordered a age 38, a lung cancer screening is ordered at age 65 for people who have smoked an average of 1 pack/day for 30 years. Previously, we screened for lung cancer using a chest xray which was not effective. By using a [place] scan, we can pick up lung cancer in a very early stage when we are able to cure it. There have been numerous medical studies to evaluate the safety and effectiveness of lung cancer screening and these studies are ongoing. This screening will be ordered yearly. We will stop screening when you reach 65 years old or have quit smoking for more than 15 years. THIS IS A YEARLY SCREENING AND IF YOUR TEST IS NORMAL WE REQUEST THAT YOU RETURN IN ONE YEAR.Now the risks. There is radiation exposure with a [place] scan. However, this is a low radiation dose screening which is ? the dose of a normal [place] scan. Also, radiology bases the radiation dose on your height and weight so it is personalized. It is the equivalent of about 8 chest xrays. There also is the possibility of ?overdiagnosis?. This is because the screening is ?too good?. It can see every fleck and freckle in your lungs.  There is a 20% chance a lung nodule will be seen. Lung nodules are most commonly scars from smoking or old infection. We have a team of chest radiologists who review all our lung cancer screenings. The don't look at brains or bones-only lungs. They may comment on a lung nodule but still tell you not to come back for a year. If the radiologist isn't worried you shoudn't be.  The screening may also pick up something outside of the lungs depending on how big of a picture they take. If this occurs I will discuss these findings with you and make sure you get proper follow-up. Now I would like to tell you about the actual [place] scan. There is no IV contrast, no dye. You don't need bloodwork prior to coming. It is an open test so you don't need to worry about claustrophobia or anxiety. The radiology tech will have you lay down on a table and you will hold your breath while the pictures are being taken. It takes about 10 minutes. Results will come back in 24-48 hours and your doctor will review them with you. Do you have any questions? NoTobacco Cessation  I see  you are working on quitting and are down to a few a day.  Glad to see that, keep up the good work

## 2021-07-30 ENCOUNTER — Encounter
Admit: 2021-07-30 | Payer: PRIVATE HEALTH INSURANCE | Attending: Student in an Organized Health Care Education/Training Program | Primary: Internal Medicine

## 2021-07-30 DIAGNOSIS — M109 Gout, unspecified: Secondary | ICD-10-CM

## 2021-08-04 ENCOUNTER — Ambulatory Visit: Admit: 2021-08-04 | Payer: PRIVATE HEALTH INSURANCE | Primary: Internal Medicine

## 2021-08-09 ENCOUNTER — Encounter: Admit: 2021-08-09 | Payer: PRIVATE HEALTH INSURANCE | Attending: Cardiovascular Disease | Primary: Internal Medicine

## 2021-08-15 ENCOUNTER — Ambulatory Visit: Admit: 2021-08-15 | Payer: PRIVATE HEALTH INSURANCE | Attending: Ophthalmology | Primary: Internal Medicine

## 2021-08-15 ENCOUNTER — Encounter: Admit: 2021-08-15 | Payer: PRIVATE HEALTH INSURANCE | Primary: Internal Medicine

## 2021-08-15 DIAGNOSIS — G4485 Primary stabbing headache: Secondary | ICD-10-CM

## 2021-08-15 NOTE — Progress Notes
Impression:?1) headaches and pressure sensation behind Right eye x one monthHas had sinus issues in pastHad tooth removed Right lower: some discomfort in area since thenDenies jaw claudication, scalp tenderness, no malaiseNo tenderness with in temporal region?No ocular etiology to explainRecommend evaluation with medical MD for evaluation of sinuses and consideration of neurological evaluation for headaches?F/u one year, sooner prn

## 2021-08-31 ENCOUNTER — Encounter: Admit: 2021-08-31 | Payer: PRIVATE HEALTH INSURANCE | Primary: Internal Medicine

## 2021-08-31 ENCOUNTER — Encounter: Admit: 2021-08-31 | Payer: PRIVATE HEALTH INSURANCE | Attending: Cardiovascular Disease | Primary: Internal Medicine

## 2021-09-02 ENCOUNTER — Encounter: Admit: 2021-09-02 | Payer: PRIVATE HEALTH INSURANCE | Primary: Internal Medicine

## 2021-11-16 ENCOUNTER — Telehealth: Admit: 2021-11-16 | Payer: PRIVATE HEALTH INSURANCE | Attending: Cardiovascular Disease | Primary: Internal Medicine

## 2021-11-23 ENCOUNTER — Encounter: Admit: 2021-11-23 | Payer: PRIVATE HEALTH INSURANCE | Attending: Internal Medicine | Primary: Internal Medicine

## 2021-11-23 DIAGNOSIS — M109 Gout, unspecified: Secondary | ICD-10-CM

## 2021-12-05 ENCOUNTER — Encounter: Admit: 2021-12-05 | Payer: PRIVATE HEALTH INSURANCE | Primary: Internal Medicine

## 2021-12-05 ENCOUNTER — Encounter: Admit: 2021-12-05 | Payer: PRIVATE HEALTH INSURANCE | Attending: Internal Medicine | Primary: Internal Medicine

## 2021-12-05 ENCOUNTER — Encounter: Admit: 2021-12-05 | Payer: PRIVATE HEALTH INSURANCE | Attending: Cardiovascular Disease | Primary: Internal Medicine

## 2021-12-05 DIAGNOSIS — M109 Gout, unspecified: Secondary | ICD-10-CM

## 2021-12-05 MED ORDER — ALLOPURINOL 300 MG TABLET
300 mg | ORAL_TABLET | Freq: Every day | ORAL | 4 refills | Status: AC
Start: 2021-12-05 — End: 2021-12-13

## 2021-12-08 ENCOUNTER — Encounter
Admit: 2021-12-08 | Payer: PRIVATE HEALTH INSURANCE | Attending: Student in an Organized Health Care Education/Training Program | Primary: Internal Medicine

## 2021-12-08 ENCOUNTER — Encounter: Admit: 2021-12-08 | Payer: PRIVATE HEALTH INSURANCE | Attending: Internal Medicine | Primary: Internal Medicine

## 2021-12-08 DIAGNOSIS — M109 Gout, unspecified: Secondary | ICD-10-CM

## 2021-12-13 ENCOUNTER — Ambulatory Visit
Admit: 2021-12-13 | Payer: PRIVATE HEALTH INSURANCE | Attending: Student in an Organized Health Care Education/Training Program | Primary: Internal Medicine

## 2021-12-13 ENCOUNTER — Encounter
Admit: 2021-12-13 | Payer: PRIVATE HEALTH INSURANCE | Attending: Student in an Organized Health Care Education/Training Program | Primary: Internal Medicine

## 2021-12-13 ENCOUNTER — Encounter: Admit: 2021-12-13 | Payer: PRIVATE HEALTH INSURANCE | Attending: Cardiovascular Disease | Primary: Internal Medicine

## 2021-12-13 DIAGNOSIS — M109 Gout, unspecified: Secondary | ICD-10-CM

## 2021-12-13 DIAGNOSIS — Z0000 Encounter for general adult medical examination without abnormal findings: Secondary | ICD-10-CM

## 2021-12-13 DIAGNOSIS — I10 Essential (primary) hypertension: Secondary | ICD-10-CM

## 2021-12-13 DIAGNOSIS — N529 Male erectile dysfunction, unspecified: Secondary | ICD-10-CM

## 2021-12-13 DIAGNOSIS — I441 Atrioventricular block, second degree: Secondary | ICD-10-CM

## 2021-12-13 DIAGNOSIS — L821 Other seborrheic keratosis: Secondary | ICD-10-CM

## 2021-12-13 MED ORDER — CHLORTHALIDONE 25 MG TABLET
25 mg | ORAL_TABLET | Freq: Every day | ORAL | 12 refills | Status: AC
Start: 2021-12-13 — End: ?

## 2021-12-13 MED ORDER — DIPHTH,PERTUSSIS(ACELL),TETANUS 2.5 LF UNIT-8 MCG-5 LF/0.5 ML IM SUSP
0.5 mL | INTRAMUSCULAR | Status: CP
Start: 2021-12-13 — End: ?
  Administered 2021-12-13: 15:00:00 0.5 mL via INTRAMUSCULAR

## 2021-12-13 MED ORDER — FLU VACCINE QS2023-24(65YR UP)(PF)240 MCG/0.7 ML INTRAMUSCULAR SYRINGE
240 mcg/0.7 mL | INTRAMUSCULAR | Status: CP
Start: 2021-12-13 — End: ?
  Administered 2021-12-13: 15:00:00 240 mL via INTRAMUSCULAR

## 2021-12-13 MED ORDER — ALLOPURINOL 300 MG TABLET
300 mg | ORAL_TABLET | Freq: Every day | ORAL | 4 refills | Status: AC
Start: 2021-12-13 — End: ?

## 2021-12-14 ENCOUNTER — Telehealth: Admit: 2021-12-14 | Payer: PRIVATE HEALTH INSURANCE | Attending: Internal Medicine | Primary: Internal Medicine

## 2021-12-14 ENCOUNTER — Telehealth: Admit: 2021-12-14 | Payer: PRIVATE HEALTH INSURANCE | Primary: Internal Medicine

## 2021-12-14 ENCOUNTER — Encounter
Admit: 2021-12-14 | Payer: PRIVATE HEALTH INSURANCE | Attending: Student in an Organized Health Care Education/Training Program | Primary: Internal Medicine

## 2021-12-14 ENCOUNTER — Encounter: Admit: 2021-12-14 | Payer: PRIVATE HEALTH INSURANCE | Attending: Internal Medicine | Primary: Internal Medicine

## 2021-12-14 DIAGNOSIS — M545 Acute low back pain without sciatica, unspecified back pain laterality: Secondary | ICD-10-CM

## 2021-12-14 DIAGNOSIS — Z23 Encounter for immunization: Secondary | ICD-10-CM

## 2021-12-14 MED ORDER — ACETAMINOPHEN ER 650 MG TABLET,EXTENDED RELEASE
650 mg | ORAL_TABLET | Freq: Three times a day (TID) | ORAL | 2 refills | Status: AC | PRN
Start: 2021-12-14 — End: ?

## 2021-12-14 MED ORDER — TIZANIDINE 2 MG TABLET
2 mg | ORAL_TABLET | ORAL | 1 refills | Status: AC | PRN
Start: 2021-12-14 — End: 2021-12-16

## 2021-12-14 NOTE — Telephone Encounter
Spoke to patient, continues to have 7/10 back pain, difficulty sleeping at night, after discussion, we will have a 4 day course of tinazidine for the low back pain, if no improvement in the next 3 days, recommended to make an acute visit.

## 2021-12-14 NOTE — Telephone Encounter
Copied from CRM #[id number]. Topic: Refills/Medications - [facility] PCC>> Dec 14, 2021  2:36 PM Steffanie Dunn wrote:[place], Lulu Riding calling for a refill or question regarding their medication.Rx-tiZANidine (ZANAFLEX) 2 mg tablet	12 tablet	0	12/14/2021	12/18/2021	Sig - Route: Take 1 tablet (2 mg total) by mouth as needed for muscle spasms for up to 4 days. - Oral	Sent to pharmacy as: tiZANidine (ZANAFLEX) 2 mg tablet	Please clarify directionsHow often is patient to take medicine

## 2021-12-14 NOTE — Progress Notes
[facility] Progress NoteSubjective: This is Brett [name], 66 y.o. 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, gout, erectile dysfunction, hypertension,?current smoker (5 cigarettes/day) that came to [facility] for follow up visit.Encounter details:Brett [name] was seen today  by himself  .Language preference: patient preferred to use English for today's encounter.During the visit the following problems were addressed:# Hypertension- Brett [name] takes chlorthalidone 25 MG qday as home anti-hypertension medications- Patient is adherent to the medication regimen- Blood Pressure (BP) on today's visit is 139/76- Brett [name] checks BP at home, with measurements around 130-139/75-85# Gout- Is on  current treatment with daily allopurinol; Colchicine is on file but he reports not taking it- Last time had an attack about 2-3 years ago# Smoking- About 5 cigarettes /day- Not using nicotine patch- Is interested in quitting - Plan to cut down to 3 cigarettes /day by the time of the next visit# Facial skin lestion- Had it for about 20 years- On the right side of the face in area of zygomatic bone in front- Progressively gets bigger, and now interferes with his activity, including with visionROS negative for CP, SOB, headache, dizziness, abdominal pain, n/v, changes in bowel habits, dysuria, weakness, fatigue, cough, sore throat, fever, sick contacts.Past History: Past Medical History Patient  has a past medical history of 2nd degree AV block, Erectile dysfunction, Gout, and Hypertension (03/2020).Past Surgical History Patient  has a past surgical history that includes Pacemaker insertion; Hernia repair; and Colonoscopy w/ or w/o biopsy (06/21/2020).Allergies Patient has No Known Allergies.Family History Patient family history is not on file.Social History Patient  reports that he has been smoking cigarettes. He has a 12.8 pack-year smoking history. He has never used smokeless tobacco. He reports current alcohol use of about 5.0 standard drinks of alcohol per week. He reports that he does not use drugs.Outpatient MedicationsNo outpatient medications have been marked as taking for the 12/13/21 encounter (Office Visit) with Clydia Llano, MD. Objective: Vitals:  12/13/21 0953 BP: 139/76 Site: Left Arm Position: Sitting Cuff Size: Large Pulse: 89 Temp: 98 ?F (36.7 ?C) TempSrc: No-touch scanner SpO2: 99% Weight: 81.1 kg Height: 6' (1.829 m) Physical Exam:Head: Atraumatic/ normocephalic.Eyes: PERRLA, EOMI. Ears: No apparent hearing impairment, ear discharge or deformity. Nose: is normal. Mouth: lips have good color and appearance. Moist mucosal membranes.Neck: Veins are flat. Trachea is in the midline. Thyroid is not enlarged. Chest: There is normal respiratory effort Lungs: clear to auscultation bilaterally, no wheezing or crackles appreciatedHeart: S1S2 RRR, no murmurs, gallops or rubs.Abdomen: Positive bowel sounds, soft, non tender and non distended.Extremities: No edema, 2+ Distal pulses present b/l.Skin: warm and dry. Stuck-on 1.5 cm skin lesion present on right face, see media for further detailNeurologic: The patient is oriented to time, place, and person. Cranial nerves II-XII grossly intact b/l, motor strength 5/5 bilaterally, sensory exam grossly intact b/l. Labs: Complete Blood Count:Lab Results Component Value Date  WBC 5.7 09/17/2020  RBC 4.51 09/17/2020  RBC 4.2 (L) 05/23/2019  HGB 15.4 09/17/2020  HCT 43.30 09/17/2020  HCT 40.9 05/23/2019  MCV 96.0 09/17/2020  MCH 34.1 (H) 09/17/2020  MCHC 35.6 09/17/2020  PLT 222 09/17/2020  MPV 10.6 09/17/2020 Comprehensive Metabolic Panel:Lab Results Component Value Date  GLU 71 12/20/2020  BUN 14 12/20/2020  CREATININE 1.07 12/20/2020  NA 141 12/20/2020  K 3.9 12/20/2020  CL 101 12/20/2020  CO2 31 (H) 12/20/2020  ALBUMIN 4.5 12/20/2020  PROT  7.2 12/20/2020  BILITOT 0.5 12/20/2020  ALKPHOS 100 12/20/2020  ALT 23 12/20/2020  GLOB 2.7 12/20/2020  CALCIUM 9.5 12/20/2020 ImagingNo results found.EKGResults for orders placed or performed in visit on 08/09/21 EKG - Performed during office visit)  Narrative  Please refer to MD note for interpretations. Assessment and plan: 66 y.o. male with the following problemsProblem List as of 12/13/2021   Other  Gout  Hypertension Brett [name] was seen today for follow-up.Diagnoses and all orders for this visit:Hypertension, unspecified type, well controlled, continue current therapy-     chlorthalidone (HYGROTEN) 25 mg tablet; Take 1 tablet (25 mg total) by mouth daily.Gout, unspecified cause, unspecified chronicity, unspecified site-     allopurinoL (ZYLOPRIM) 300 mg tablet; Take 1 tablet (300 mg total) by mouth daily. The patient needs to keep f/u appointment on 01/20/21 for further refills-	Instructed him that if he has concern of gout recurrence - then to seek further medical attentionHealthcare maintenance-     Tdap (Tetanus-diphtheria-acellular pertussis toxoids) injection 0.5 mL-     influenza vaccine (PF) 2023-2024 (HIGH DOSE > or = 65 yo) injection 0.7 mL- 	 Plan to cut down to 3 cigarettes /day by the time of the next visitSeborrheic keratosis, for 20 years, bothersome to him, wants it removed-     Ambulatory [facility] Dermatology/[facility] DermatologyImmunization History Administered Date(s) Administered ? COVID-19, MODERNA 12Y+, 0.5 mL 04/12/2019, 05/10/2019 ? Influenza, high dose, quad, 0.7 mL, preservative free 12/13/2021 ? Influenza, injectable, quadrivalent, preservative free 11/13/2019, 02/01/2021 ? Tdap 12/13/2021 Health Maintenance Topic Date Due ? Lung Cancer Screening  Never done ? Pneumo Vaccine 65+ (1 - PCV) Never done ? Shingles vaccine (Shingrix) (1 of 2 - Shingrix (RZV) 2 Dose Standard Series) Never done ? Respiratory Syncytial Virus (RSV) age >55 years (1 - 37-dose 60+ series) Never done ? Covid-19 vaccine series (3 - 2023-24 season) 09/30/2021 ? Diabetes screening  02/02/2024 ? Lipid disorder screening  12/20/2025 ? Colon cancer screening, Colonoscopy  06/22/2030 ? Tetanus adult (Td q 10,TDAP once)  12/14/2031 ? Hepatitis C screening  Completed ? Influenza vaccine  Completed ? HIV screening  Completed ? Aortic Aneurysm screening  Completed ? Colon cancer screening,FIT DNA (Cologuard)  Discontinued Follow-Up: Return in about 4 months (around 04/19/2022) for With PCP Dr Rod Can .Patient EducationPlease refer to patient instructions.Refills addressed: YesHealthcare maintenance addressed: YesUpdated Medication List in Pt Instructions ?  yesVisit Diagnosis completed in chart ? yes Problem List reviewed ? yesCase discussed with Dr. Raleigh Callas, MD, refer to the addendum for the final recommendations.Clydia Llano, MD, PGY-3Internal MedicineNovember 14, 2023

## 2021-12-14 NOTE — Telephone Encounter
-----   Message from [name] sent at 12/14/2021  7:53 AM EST -----Regarding: Reaction to shots Contact: [phone] Ryzhkov yesterday I came in for a follow-up visit and was administered 2 shots and now I am having fever, chills, involuntary muscle spasms. My lower back around the kidney area is very sore and I just regained use of my right arm which was where the shots were given. I have never had an reaction like this before. Should I come back in for a check up?

## 2021-12-14 NOTE — Telephone Encounter
Message received as noted Pt received flu and Tdap yesterday Called pt today-went to [facility] left  will forward to admin provider to try to contact pt this afternoon

## 2021-12-14 NOTE — Telephone Encounter
Checking status of Rx

## 2021-12-16 ENCOUNTER — Encounter
Admit: 2021-12-16 | Payer: PRIVATE HEALTH INSURANCE | Attending: Student in an Organized Health Care Education/Training Program | Primary: Internal Medicine

## 2021-12-16 ENCOUNTER — Telehealth: Admit: 2021-12-16 | Payer: PRIVATE HEALTH INSURANCE | Primary: Internal Medicine

## 2021-12-16 DIAGNOSIS — M545 Acute low back pain without sciatica, unspecified back pain laterality: Secondary | ICD-10-CM

## 2021-12-16 MED ORDER — TIZANIDINE 2 MG TABLET
2 mg | ORAL_TABLET | Freq: Three times a day (TID) | ORAL | 1 refills | Status: AC | PRN
Start: 2021-12-16 — End: ?

## 2021-12-16 NOTE — Telephone Encounter
Rx Tizanidine was ordered without directions routing to admin provider to reorder

## 2021-12-16 NOTE — Telephone Encounter
-----   Message from [name] sent at 12/14/2021  4:17 PM EST -----Regarding: New prescription Contact: [phone] was contacted by the doctor today in regards to my symptoms and he said that he would send over a script for muscle relaxers and it was incorrect. The pharmacy at [facility] said that they had resent request back to the Dr but they have gotten no response could you please send it corrected to them so that I may get some relief ASAP!

## 2021-12-30 ENCOUNTER — Encounter: Admit: 2021-12-30 | Payer: PRIVATE HEALTH INSURANCE | Attending: Cardiovascular Disease | Primary: Internal Medicine

## 2022-01-11 ENCOUNTER — Encounter: Admit: 2022-01-11 | Payer: PRIVATE HEALTH INSURANCE | Primary: Internal Medicine

## 2022-03-06 ENCOUNTER — Encounter: Admit: 2022-03-06 | Payer: PRIVATE HEALTH INSURANCE | Primary: Internal Medicine

## 2022-03-06 ENCOUNTER — Encounter: Admit: 2022-03-06 | Payer: PRIVATE HEALTH INSURANCE | Attending: Cardiovascular Disease | Primary: Internal Medicine

## 2022-03-20 ENCOUNTER — Encounter: Admit: 2022-03-20 | Payer: PRIVATE HEALTH INSURANCE | Primary: Internal Medicine

## 2022-03-20 ENCOUNTER — Encounter: Admit: 2022-03-20 | Payer: PRIVATE HEALTH INSURANCE | Attending: Internal Medicine | Primary: Internal Medicine

## 2022-03-20 ENCOUNTER — Emergency Department: Admit: 2022-03-20 | Payer: PRIVATE HEALTH INSURANCE | Attending: Diagnostic Radiology | Primary: Internal Medicine

## 2022-03-20 ENCOUNTER — Encounter: Admit: 2022-03-20 | Payer: PRIVATE HEALTH INSURANCE | Attending: Cardiovascular Disease | Primary: Internal Medicine

## 2022-03-20 ENCOUNTER — Inpatient Hospital Stay: Admit: 2022-03-20 | Discharge: 2022-03-20 | Payer: PRIVATE HEALTH INSURANCE | Primary: Internal Medicine

## 2022-03-20 DIAGNOSIS — I10 Essential (primary) hypertension: Secondary | ICD-10-CM

## 2022-03-20 DIAGNOSIS — R5383 Other fatigue: Secondary | ICD-10-CM

## 2022-03-20 DIAGNOSIS — Z20822 Contact with and (suspected) exposure to covid-19: Secondary | ICD-10-CM

## 2022-03-20 DIAGNOSIS — N529 Male erectile dysfunction, unspecified: Secondary | ICD-10-CM

## 2022-03-20 DIAGNOSIS — R0602 Shortness of breath: Secondary | ICD-10-CM

## 2022-03-20 DIAGNOSIS — R079 Chest pain, unspecified: Secondary | ICD-10-CM

## 2022-03-20 DIAGNOSIS — M109 Gout, unspecified: Secondary | ICD-10-CM

## 2022-03-20 DIAGNOSIS — Z950 Presence of cardiac pacemaker: Secondary | ICD-10-CM

## 2022-03-20 DIAGNOSIS — E876 Hypokalemia: Secondary | ICD-10-CM

## 2022-03-20 DIAGNOSIS — I441 Atrioventricular block, second degree: Secondary | ICD-10-CM

## 2022-03-20 LAB — CBC WITH AUTO DIFFERENTIAL
BKR ALBUMIN: 2.7 % (ref 0.0–5.0)
BKR WAM ABSOLUTE IMMATURE GRANULOCYTES.: 0.01 x 1000/??L (ref 0.00–0.30)
BKR WAM ABSOLUTE LYMPHOCYTE COUNT.: 1.1 x 1000/ÂµL (ref 0.60–3.70)
BKR WAM ABSOLUTE NRBC (2 DEC): 0 x 1000/??L (ref 0.00–1.00)
BKR WAM ANALYZER ANC: 3.08 x 1000/ÂµL (ref 2.00–7.60)
BKR WAM BASOPHIL ABSOLUTE COUNT.: 0.02 x 1000/??L (ref 0.00–1.00)
BKR WAM BASOPHILS: 0.4 % (ref 0.0–1.4)
BKR WAM EOSINOPHIL ABSOLUTE COUNT.: 0.13 x 1000/??L (ref 0.00–1.00)
BKR WAM EOSINOPHILS: 2.7 % (ref 0.0–5.0)
BKR WAM HEMATOCRIT (2 DEC): 37.8 % — ABNORMAL LOW (ref 38.50–50.00)
BKR WAM HEMOGLOBIN: 13.9 g/dL (ref 13.2–17.1)
BKR WAM IMMATURE GRANULOCYTES: 0.2 % (ref 0.0–1.0)
BKR WAM LYMPHOCYTES: 23.1 % (ref 17.0–50.0)
BKR WAM MCH (PG): 35.4 pg — ABNORMAL HIGH (ref 27.0–33.0)
BKR WAM MCHC: 36.8 g/dL — ABNORMAL HIGH (ref 31.0–36.0)
BKR WAM MCV: 96.2 fL (ref 80.0–100.0)
BKR WAM MONOCYTE ABSOLUTE COUNT.: 0.42 x 1000/??L — CR (ref 0.00–1.00)
BKR WAM NEUTROPHILS: 64.8 % (ref 39.0–72.0)
BKR WAM PLATELETS: 204 x1000/ÂµL (ref 150–420)
BKR WAM RDW-CV: 12.6 % (ref 11.0–15.0)
BKR WAM RED BLOOD CELL COUNT.: 3.93 M/??L — ABNORMAL LOW (ref 4.00–6.00)
BKR WAM WHITE BLOOD CELL COUNT: 4.8 x1000/??L (ref 4.0–11.0)

## 2022-03-20 LAB — COMPREHENSIVE METABOLIC PANEL
BKR A/G RATIO: 1.3 (ref 1.0–2.2)
BKR ALANINE AMINOTRANSFERASE (ALT): 19 U/L (ref 9–59)
BKR ALKALINE PHOSPHATASE: 118 U/L (ref 9–122)
BKR ANION GAP: 13 g/dL — ABNORMAL HIGH (ref 7–17)
BKR ASPARTATE AMINOTRANSFERASE (AST): 26 U/L (ref 10–35)
BKR AST/ALT RATIO: 1.4
BKR BILIRUBIN TOTAL: 0.3 mg/dL (ref <=1.2)
BKR BLOOD UREA NITROGEN: 15 mg/dL (ref 8–23)
BKR BUN / CREAT RATIO: 15.5 % (ref 8.0–23.0)
BKR CALCIUM: 9.5 mg/dL (ref 8.8–10.2)
BKR CHLORIDE: 99 mmol/L (ref 98–107)
BKR CO2: 26 mmol/L (ref 20–30)
BKR CREATININE: 0.97 mg/dL (ref 0.40–1.30)
BKR EGFR, CREATININE (CKD-EPI 2021): >60 mL/min/{1.73_m2} (ref >=60)
BKR GLOBULIN: 3.3 g/dL (ref 2.3–3.5)
BKR GLUCOSE: 111 mg/dL — ABNORMAL HIGH (ref 70–100)
BKR POTASSIUM: 3 mmol/L — ABNORMAL LOW (ref 3.3–5.3)
BKR PROTEIN TOTAL: 7.5 g/dL (ref 6.6–8.7)
BKR SODIUM: 138 mmol/L (ref 136–144)
BKR WAM MONOCYTES: 7.5 g/dL (ref 6.6–8.7)
BKR WAM MPV: 0.97 mg/dL (ref 0.40–1.30)
BKR WAM NUCLEATED RED BLOOD CELLS: 19 U/L (ref 9–59)

## 2022-03-20 LAB — TROPONIN T HIGH SENSITIVITY, 1 HOUR WITH REFLEX (BH GH LMW YH)
BKR TROPONIN T HS 1 HOUR DELTA FROM 0 HOUR: -2 ng/L
BKR TROPONIN T HS 1 HOUR: 8 ng/L (ref 29.2–57.8)

## 2022-03-20 LAB — TROPONIN T HIGH SENSITIVITY, 0 HOUR BASELINE WITH REFLEX (BH GH LMW YH): BKR TROPONIN T HS 0 HOUR BASELINE: 10 ng/L

## 2022-03-20 LAB — MAGNESIUM: BKR MAGNESIUM: 2 mg/dL (ref 1.7–2.4)

## 2022-03-20 LAB — D-DIMER, QUANTITATIVE: BKR D-DIMER: 0.52 mg{FEU}/L (ref <=0.65)

## 2022-03-20 MED ORDER — POTASSIUM CHLORIDE ER 20 MEQ TABLET,EXTENDED RELEASE(PART/CRYST)
20 MEQ | Freq: Once | ORAL | Status: CP
Start: 2022-03-20 — End: ?
  Administered 2022-03-20: 22:00:00 20 MEQ via ORAL

## 2022-03-20 MED ORDER — ASPIRIN 81 MG CHEWABLE TABLET
81 mg | Freq: Once | ORAL | Status: CP
Start: 2022-03-20 — End: ?
  Administered 2022-03-20: 22:00:00 81 mg via ORAL

## 2022-03-20 NOTE — ED Notes
5:17 PM received pt in bed from [facility], aaox4, placed on central monitor, VS WDL, denies pain, respirations even and unlabored, all needs met, will continue to monitor and maintain safety. 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

## 2022-03-20 NOTE — ED Provider Notes
Chief Complaint Patient presents with ? Chest Pain   Chest pain and SOB since 1 am Pt went to work today and increased fatigue Hx pacemaker  HPI/PE:67 yo M with PMH HTN, pacemaker placement presents for chest pain starting overnight. Patient states around 1 AM he developed sharp pain to his chest lasting until about 11 AM. He states he has never had symptoms to this degree in the past. He states associated weakness and diaphoresis during this episode. He denies any preceeding changes to his health. He states resolution of his symptoms at this time.Patient presents with vitals within normal limits with unremarkable examination. ECG demonstrates right bundle branch block. Will obtain CBC, CMP, troponin, d-dimer, chest xray administer ASA and reassess to determine disposition.  Physical ExamED Triage Vitals [03/20/22 1522]BP: 131/74Pulse: (!) 101Pulse from  O2 sat: n/aResp: 18Temp: 98 ?F (36.7 ?C)Temp src: TemporalSpO2: 97 % BP 118/75  - Pulse 81  - Temp 98 ?F (36.7 ?C) (Temporal)  - Resp 18  - Wt 81.7 kg (180 lb 1.9 oz)  - SpO2 97%  - BMI 24.43 kg/m? Physical ExamVitals reviewed. HENT:    Head: Normocephalic and atraumatic.    Right Ear: External ear normal.    Left Ear: External ear normal.    Nose: Nose normal.    Mouth/Throat:    Mouth: Mucous membranes are moist. Eyes:    Extraocular Movements: Extraocular movements intact.    Conjunctiva/sclera: Conjunctivae normal.    Pupils: Pupils are equal, round, and reactive to light. Cardiovascular:    Rate and Rhythm: Normal rate and regular rhythm.    Pulses: Normal pulses. Pulmonary:    Effort: Pulmonary effort is normal. Abdominal:    General: Abdomen is flat. Musculoskeletal:       General: Normal range of motion.    Cervical back: Normal range of motion. Skin:   General: Skin is warm.    Capillary Refill: Capillary refill takes less than 2 seconds. Neurological: General: No focal deficit present.    Mental Status: He is alert.  ProceduresAttestation/Critical CarePatient Reevaluation: 5:05 PMPotassium 3.0, supplementation ordered.5:12 PMSpoke with Museum/gallery curator who states pacemaker without events over last 72 hours. Bedside ultrasound performed by resident demonstrating AOFT less than 4 cm, no pericardial effusion, normal squeeze and LV greater than RV.6:28 PMPotassium 3.0, troponin 10 and 8 while remainder of CBC, CMP, d-dimer nonactionable. On reassessment, patient asymptomatic. Potassium supplementation ordered. Reviewed results of workup with patient and informed need for cardiology follow up. Patient vocalizes understanding of this plan. Discharged with ED return precautions. Discharge viral panel sent.Comments as of 03/20/22 1831 Mon Mar 20, 2022 1545 Patient not at bedside. [AM]  Comments User Index[AM] Juanda Bond, MD   Clinical Impressions as of 03/20/22 1831 Chest pain, unspecified type Hypokalemia  ED DispositionDischarge Juanda Bond, MD02/19/24 1610

## 2022-03-20 NOTE — ED Procedure Note
Brett [name] is a 67 y.o. male patient.No diagnosis found.Past Medical History: Diagnosis Date  2nd degree AV block   Erectile dysfunction   Gout   Hypertension 03/2020 Blood pressure 118/75, pulse 83, temperature 98 F (36.7 C), temperature source Temporal, resp. rate 18, weight 81.7 kg (180 lb 1.9 oz), SpO2 97 %.ED Bedside USPerformed by: Konrad Penta, MDAuthorized by: Juanda Bond, MD  Type of ultrasound: echocardiogram  Performed [id number]: chest pain  Views: parasternal long axis view, parasternal short axis view, apical 4 chamber view and sub-xiphoid IVC view  Effusion: no effusion  Ejection fraction: normal 50-65%  RV to LV ratio: RV<LV  Aortic Root Diameter: normal (<4.0 cm)  IVC qualitative size: Normal sized  Interpretation: No acute findings  Reviewed by attending? Attending reviewed.  Patient tolerance of procedure:  Tolerated well, no immediate complicationsWilliam Shella [name] MD2/19/2024 Konrad Penta, MDResident02/19/24 1654 Juanda Bond, MD02/19/24 1725

## 2022-03-20 NOTE — ED Notes
3:58 PM Pt presents to ED c/o chest pain, fatigue and headache starting today. Pt reports waking up around 1AM with sharp L sided chest pain. Pt states episode lasted for several hours until 11 AM and resolved without intervention. PMH pacemaker. Pt called cardiologist this morning for symptoms and was referred to ED for f/u care. IV placed, blood work drawn and sent in PIT. PIT RN reporting pt became flush and pale after IV insertion. Pt denies any pain or  dizziness at this time. Provider at bedside for eval.4:36 PMProvider at bedside for pacemaker interrogation and bedside US echo4:58 PMProvider requesting tele. Pt moved to [facility] for tele monitoring. Report given to [place], [place]

## 2022-03-20 NOTE — Discharge Instructions
You were seen in the emergency department today for chest pain among other symptoms. Testing showed no concerning cause of your symptoms. Please follow up with your cardiologist for further testing. If you develop any new or recurrence of symptoms, please return to the emergency department.

## 2022-03-21 ENCOUNTER — Encounter: Admit: 2022-03-21 | Payer: PRIVATE HEALTH INSURANCE | Primary: Internal Medicine

## 2022-03-21 LAB — SARS COV-2 (COVID-19) RNA: BKR SARS-COV-2 RNA (COVID-19) (YH): NEGATIVE

## 2022-03-21 LAB — INFLUENZA A+B/RSV BY RT-PCR
BKR INFLUENZA A: NEGATIVE x 1000/??L — ABNORMAL HIGH (ref 0.41–0.92)
BKR INFLUENZA B: NEGATIVE
BKR RESPIRATORY SYNCYTIAL VIRUS: NEGATIVE

## 2022-03-21 NOTE — Progress Notes
Patient recently seen in ED due to chest pain.Attempted to reach patient. Call goes to vmail.Left patient a vmail to contact [facility] with questions or if a sooner appt is needed. Also advised to contact cardiac provider to see if a sooner appt is needed with them.

## 2022-04-17 ENCOUNTER — Encounter: Admit: 2022-04-17 | Payer: PRIVATE HEALTH INSURANCE | Attending: Internal Medicine | Primary: Internal Medicine

## 2022-04-17 ENCOUNTER — Ambulatory Visit: Admit: 2022-04-17 | Payer: PRIVATE HEALTH INSURANCE | Attending: Internal Medicine | Primary: Internal Medicine

## 2022-04-17 DIAGNOSIS — N529 Male erectile dysfunction, unspecified: Secondary | ICD-10-CM

## 2022-04-17 DIAGNOSIS — M109 Gout, unspecified: Secondary | ICD-10-CM

## 2022-04-17 DIAGNOSIS — E785 Hyperlipidemia, unspecified: Secondary | ICD-10-CM

## 2022-04-17 DIAGNOSIS — I10 Essential (primary) hypertension: Secondary | ICD-10-CM

## 2022-04-17 DIAGNOSIS — I441 Atrioventricular block, second degree: Secondary | ICD-10-CM

## 2022-04-17 DIAGNOSIS — F17200 Nicotine dependence, unspecified, uncomplicated: Secondary | ICD-10-CM

## 2022-04-17 MED ORDER — SILDENAFIL 50 MG TABLET
50 mg | ORAL_TABLET | Freq: Every day | ORAL | 3 refills | Status: AC | PRN
Start: 2022-04-17 — End: ?

## 2022-04-17 MED ORDER — NICOTINE 14 MG/24 HR DAILY TRANSDERMAL PATCH
14 mg | MEDICATED_PATCH | Freq: Every day | TRANSDERMAL | 3 refills | Status: AC
Start: 2022-04-17 — End: ?

## 2022-04-17 MED ORDER — CHLORTHALIDONE 25 MG TABLET
25 mg | ORAL_TABLET | Freq: Every day | ORAL | 12 refills | Status: AC
Start: 2022-04-17 — End: ?

## 2022-04-17 MED ORDER — ALLOPURINOL 300 MG TABLET
300 mg | ORAL_TABLET | Freq: Every day | ORAL | 4 refills | Status: AC
Start: 2022-04-17 — End: ?

## 2022-04-17 MED ORDER — INDOMETHACIN 25 MG CAPSULE
25 mg | ORAL_CAPSULE | ORAL | 1 refills | Status: AC | PRN
Start: 2022-04-17 — End: ?

## 2022-04-18 DIAGNOSIS — E876 Hypokalemia: Secondary | ICD-10-CM

## 2022-04-18 NOTE — Progress Notes
[facility] NoteSubjective: Brett [name], is a 67 y.o. 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 Patient was found to be hypokalemic with K 3.0, and given supplementation. # erectile dysfunction- initially improved with occasional Cialis use but now Cialis is less effective for obtaining and sustaining erections# Smoking- smoking 3 cigarettes daily- uses gum consistently, patch seldomThe 10-year ASCVD risk score (Arnett DK, et al., 2019) is: 24.7%  Values used to calculate the score:    Age: 43 years    Sex: Male    Is Non-Hispanic African American: Yes    Diabetic: No    Tobacco smoker: Yes    Systolic Blood Pressure: 126 mmHg    Is BP treated: Yes    HDL Cholesterol: 53 mg/dL    Total Cholesterol: 161 mg/dLROS:General: No fevers or chills, Resp: No shortness of breath, Cardiovascular: No chest pain or palpitations, Abdominal: No abdominal pain, nausea or vomiting, Extremities: No edema, Skin: No bruising, Neuro: No tremor or neurologic complaints, Psych: No mood complaints or disturbances, Heme: No unusual bleeding. Endocrine: No heat or cold intolerance, weight gain/loss, neck swelling, fatigue, constipation/diarrhea Past Medical History Patient  has a past medical history of 2nd degree AV block, Erectile dysfunction, Gout, and Hypertension (03/2020).Past Surgical History Patient  has a past surgical history that includes Pacemaker insertion; Hernia repair; and Colonoscopy w/ or w/o biopsy (06/21/2020).Family History family history is not on file.Social History Patient  reports that he has been smoking cigarettes. He has a 12.8 pack-year smoking history. He has never used smokeless tobacco. He reports current alcohol use of about 5.0 standard drinks of alcohol per week. He reports that he does not use drugs.Allergies Patient has No Known Allergies.Outpatient MedicationsCurrent Outpatient Medications Medication Sig  allopurinoL (ZYLOPRIM) 300 mg tablet Take 1 tablet (300 mg total) by mouth daily. The patient needs to keep f/u appointment on 01/20/21 for further refills  chlorthalidone (HYGROTEN) 25 mg tablet Take 1 tablet (25 mg total) by mouth daily.  indomethacin (INDOCIN) 25 mg capsule Take 1 capsule (25 mg total) by mouth as needed for pain.  Miscellaneous Medical Supply (BLOOD PRESSURE CUFF) BP cuff. Medium. ICD-10: I10. Lifetime use.  nicotine (NICODERM CQ) 14 mg transdermal patch Place 1 patch (14 mg total) onto the skin daily. Alternate arms/sites when applying patch. Discard old patch when applying new one  sildenafiL (VIAGRA) 50 mg tablet Take 1 tablet (50 mg total) by mouth daily as needed for erectile dysfunction. No current facility-administered medications for this visit. Objective: Physical exam BP 126/76 (Site: l a, Position: Sitting, Cuff Size: Medium)  - Pulse 82  - Temp 97.7 ?F (36.5 ?C) (No-touch scanner)  - Ht 6' (1.829 m)  - Wt 81.5 kg  - SpO2 100%  - BMI 24.36 kg/m? Wt Readings from Last 3 Encounters: 04/17/22 81.5 kg 03/20/22 81.7 kg 12/30/21 78.9 kg General: No apparent distress, awake, alertHEENT: NC/AT, PERRL, no pharyngeal exudate, MMM, no LAD, no thyromegaly.  Ophthalmoscope examination limited but shows mild tortuosity of vessels in R eye. Respiratory: CTAB, no wheeze/rales, No accessory respiratory muscle useCardiovascular: RRR, normal S1S2, no MRG, no carotid bruits, no JVD,  peripheral pulses intactGastrointestinal: Soft abdomen, non-tender, non-distended, normal bowel sounds, no organomegaly, no scarsNeurological: AAOx3, No focal sensory or motor deficits.MSK/Skin: No LE edema, No clubbing nor cyanosis.  No tenderness to palpation of bilateral legsStudies: Labs:The results of recent testing and lab work were reviewed with today's visit.Comprehensive Chemistry Panel  Chemistry  Lab Results Component Value Date  NA 138 03/20/2022  K 3.0 (L) 03/20/2022  CL 99 03/20/2022  CO2 26 03/20/2022  BUN 15 03/20/2022  CREATININE 0.97 03/20/2022  GLU 111 (H) 03/20/2022  Lab Results Component Value Date  CALCIUM 9.5 03/20/2022  ALKPHOS 118 03/20/2022  AST 26 03/20/2022  ALT 19 03/20/2022  BILITOT 0.3 03/20/2022   Lab Results Component Value Date  HGBA1C 5.5 02/01/2021  HGBA1C 5.5 01/19/2020  HGBA1C 5.6 05/22/2019 No results found for requested labs within last 7 days. ImagingNo results found.ZOX:WRUEAVW for orders placed or performed during the hospital encounter of 03/20/22 EKG Result Value Ref Range  Heart Rate 91 bpm  QRS Interval 126 ms  QT Interval 398 ms  QTC Interval 489 ms  P Axis 64 deg  QRS Axis 87 deg  T Wave Axis 56 deg  P-R Interval 136 msec  SEVERITY Abnormal ECG severity Assessment and Plan: [name], is a 67 y.o. male with the following problems:# hypokalemia- BMP and Mg now- May need daily K supplementation depending on K levels due to continued chlorthalidone use# hypertension- continue chlorthalidone daily for now- continue home BP checks# erectile dysfunction- will try Sildenafil 50mg - advised to stop using it if any chest pain, pressure, dizziness- rpt AM testosterone # hypertriglyceridemia - ASCVD 24.7%- would like to change lifestyle prior to starting medications (will change diet, cut down on smoking)- repeat lipid panel - not done last visit # smoking - continue using nicotine patch and gum to help with smoking cessation, is currently smoking 5 cigarettes a day, down from 10, does not want to try medication currently # gout- continue current treatment with daily colchicine, allopurinol- repeat uric acid levels# healthcare maintenance - Low dose [place] screen pending, lipid panelHealth maintenance HIV, Hep C, A1c, PSAHealth Maintenance Topic Date Due  Lung Cancer Screening  Never done  Pneumo Vaccine 65+ (1 of 2 - PCV) Never done  Shingles vaccine (Shingrix) (1 of 2 - Shingrix (RZV) 2 Dose Standard Series) Never done  Respiratory Syncytial Virus (RSV) age >79 years (1 - 36-dose 60+ series) Never done  Covid-19 vaccine series (3 - 2023-24 season) 09/30/2021  Diabetes screening  03/20/2025  Lipid disorder screening  12/20/2025  Colon cancer screening, Colonoscopy  06/22/2030  Tetanus adult (Td q 10,TDAP once)  12/14/2031  Hepatitis C screening  Completed  Influenza vaccine  Completed  HIV screening  Completed  Aortic Aneurysm screening  Completed  Colon cancer screening,FIT DNA (Cologuard)  Discontinued ImmunizationImmunization History Administered Date(s) Administered  COVID-19, MODERNA 12Y+, 0.5 mL 04/12/2019, 05/10/2019  Influenza, high dose, quad, 0.7 mL, preservative free 12/13/2021  Influenza, injectable, quadrivalent, preservative free 11/13/2019, 02/01/2021  Tdap 12/13/2021 Follow-Up: Return in about 3 months (around 07/18/2022).Case and plan discussed with Dr Particia Lather MD, attending addendum to follow. Signed: Leonor Liv, MDYale [facility]3/19/2024 at 3:24 PM

## 2022-06-06 ENCOUNTER — Encounter: Admit: 2022-06-06 | Payer: PRIVATE HEALTH INSURANCE | Primary: Internal Medicine

## 2022-06-06 ENCOUNTER — Encounter: Admit: 2022-06-06 | Payer: PRIVATE HEALTH INSURANCE | Attending: Cardiovascular Disease | Primary: Internal Medicine

## 2022-06-27 ENCOUNTER — Encounter: Admit: 2022-06-27 | Payer: PRIVATE HEALTH INSURANCE | Attending: Cardiovascular Disease | Primary: Internal Medicine

## 2022-06-30 ENCOUNTER — Encounter: Admit: 2022-06-30 | Payer: PRIVATE HEALTH INSURANCE | Primary: Internal Medicine

## 2022-07-04 ENCOUNTER — Encounter: Admit: 2022-07-04 | Payer: PRIVATE HEALTH INSURANCE | Attending: Dermatology | Primary: Internal Medicine

## 2022-07-04 ENCOUNTER — Ambulatory Visit: Admit: 2022-07-04 | Payer: PRIVATE HEALTH INSURANCE | Attending: Dermatology | Primary: Internal Medicine

## 2022-07-04 ENCOUNTER — Encounter: Admit: 2022-07-04 | Payer: PRIVATE HEALTH INSURANCE | Primary: Internal Medicine

## 2022-07-04 DIAGNOSIS — L821 Other seborrheic keratosis: Secondary | ICD-10-CM

## 2022-07-04 DIAGNOSIS — N529 Male erectile dysfunction, unspecified: Secondary | ICD-10-CM

## 2022-07-04 DIAGNOSIS — I10 Essential (primary) hypertension: Secondary | ICD-10-CM

## 2022-07-04 DIAGNOSIS — I441 Atrioventricular block, second degree: Secondary | ICD-10-CM

## 2022-07-04 DIAGNOSIS — M109 Gout, unspecified: Secondary | ICD-10-CM

## 2022-07-04 NOTE — Patient Instructions
We are sending you to plastic surgery for removal of the growth on your face. Their address is [address] [place], [place]

## 2022-07-04 NOTE — Progress Notes
[facility]Dermatology Clinic NoteSubjective Reason for visit: Consult for skin lesion on right side of faceDarrell Davondre [name], is a 67 y.o. male who was referred to Dermatology for skin lesion on the right side of the face. Of note, patient reports the skin lesion has been there for many years which stated off as a small flat mole however he noticed it continued to grow in size of the years. He reports it does not hurt but reports that the growth is interfering with his vision and him putting on his glasses.REVIEW OF SYSTEMS: 13 point review of systems done.  Significant only for the items mentioned above.Review of History: Past Medical History  has a past medical history of 2nd degree AV block, Erectile dysfunction, Gout, and Hypertension (03/2020).Past Surgical History  has a past surgical history that includes Pacemaker insertion; Hernia repair; and Colonoscopy w/ or w/o biopsy (06/21/2020).Allergies has No Known Allergies.Family History family history is not on file.Social History Patient  reports that he has been smoking cigarettes. He has a 12.8 pack-year smoking history. He has never used smokeless tobacco. He reports current alcohol use of about 5.0 standard drinks of alcohol per week. He reports that he does not use drugs.ImmunizationsImmunization History Administered Date(s) Administered  COVID-19, MODERNA 12Y+, 0.5 mL 04/12/2019, 05/10/2019  Influenza, high dose, quad, 0.7 mL, preservative free 12/13/2021  Influenza, injectable, quadrivalent, preservative free 11/13/2019, 02/01/2021  Tdap 12/13/2021 Outpatient MedicationsNo outpatient medications have been marked as taking for the 07/04/22 encounter (Initial consult) with Oestreicher, Pablo [place] MD. Objective: Physical Exam There were no vitals taken for this visit.Wt: 06/27/22 79.8 kg 04/17/22 81.5 kg 03/20/22 81.7 kg 12/30/21 78.9 kg 12/13/21 81.1 kg 08/09/21 82.6 kg Skin: brown waxy scaly rough stuck on lesion oval in shape palpable growth on the face, non tender with no erythema surrounding it consistent with seborrheic keratosis____________________________________________________________________________________________________Labs:The results of recent testing and lab work were reviewed with today's visit.Lab Results Component Value Date  WBC 4.8 03/20/2022  HGB 13.9 03/20/2022  HCT 37.80 (L) 03/20/2022  MCV 96.2 03/20/2022  PLT 204 03/20/2022 Lab Results Component Value Date  CREATININE 0.97 03/20/2022  BUN 15 03/20/2022  NA 138 03/20/2022  K 3.0 (L) 03/20/2022  CL 99 03/20/2022  CO2 26 03/20/2022 Lab Results Component Value Date  GLU 111 (H) 03/20/2022  CREATININE 0.97 03/20/2022 Lab Results Component Value Date  HGBA1C 5.5 02/01/2021  HGBA1C 5.5 01/19/2020  HGBA1C 5.6 05/22/2019 Assessment and Plan: Brett [name], is a 67 y.o. male with the following problems:# Seborrheic keratosis - patient interested in removal. Will send him to Plastic surgery for evaluation.Follow-Up: as neededCase discussed with Dr. Tomasa Hose, attending addendum to follow. SIGNED:Claramae Rigdon Estill Bakes, MD, MPH Internal Medicine Resident [id number]: [phone] [facility]6/05/2022

## 2022-07-07 ENCOUNTER — Ambulatory Visit: Admit: 2022-07-07 | Payer: PRIVATE HEALTH INSURANCE | Attending: Internal Medicine | Primary: Internal Medicine

## 2022-07-07 ENCOUNTER — Encounter: Admit: 2022-07-07 | Payer: PRIVATE HEALTH INSURANCE | Attending: Internal Medicine | Primary: Internal Medicine

## 2022-07-07 DIAGNOSIS — E876 Hypokalemia: Secondary | ICD-10-CM

## 2022-07-07 DIAGNOSIS — M109 Gout, unspecified: Secondary | ICD-10-CM

## 2022-07-07 DIAGNOSIS — I10 Essential (primary) hypertension: Secondary | ICD-10-CM

## 2022-07-07 DIAGNOSIS — L601 Onycholysis: Secondary | ICD-10-CM

## 2022-07-07 DIAGNOSIS — I441 Atrioventricular block, second degree: Secondary | ICD-10-CM

## 2022-07-07 DIAGNOSIS — N529 Male erectile dysfunction, unspecified: Secondary | ICD-10-CM

## 2022-07-07 DIAGNOSIS — Z0000 Encounter for general adult medical examination without abnormal findings: Secondary | ICD-10-CM

## 2022-07-07 MED ORDER — CHLORTHALIDONE 25 MG TABLET
25 mg | ORAL_TABLET | Freq: Every day | ORAL | 3 refills | Status: AC
Start: 2022-07-07 — End: ?

## 2022-07-07 MED ORDER — INDOMETHACIN 25 MG CAPSULE
25 mg | ORAL_CAPSULE | ORAL | 1 refills | Status: AC | PRN
Start: 2022-07-07 — End: ?

## 2022-07-07 MED ORDER — ALLOPURINOL 300 MG TABLET
300 mg | ORAL_TABLET | Freq: Every day | ORAL | 3 refills | Status: AC
Start: 2022-07-07 — End: ?

## 2022-07-10 NOTE — Progress Notes
[facility] HealthInternal Medicine Outpatient Clinic NoteSubjective: Brett [name], is a 67 y.o. 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, gout, erectile dysfunction, hypertension, current smoker here for follow up visit today.# hypertension- home readings are continuously in the 120s/130s systolic range- continues taking chlorthalidone daily- BP well controlled - 121/78# erectile dysfunction- improved with PRN sildenafil # Smoking- smoking 3 cigarettes daily- reducing cigarette use by himself, not interested in NRT# Nail separation- had direct nail trauma a few weeks and now has toenail separation of right great toe UJW:[id number]: No fevers or chills, Resp: No shortness of breath, Cardiovascular: No chest pain or palpitations, Abdominal: No abdominal pain, nausea or vomiting, Extremities: No edema, Skin: No bruising, Neuro: No tremor or neurologic complaints, Psych: No mood complaints or disturbances, Heme: No unusual bleeding. Endocrine: No heat or cold intolerance, weight gain/loss, neck swelling, fatigue, constipation/diarrhea Past Medical History Patient  has a past medical history of 2nd degree AV block, Erectile dysfunction, Gout, and Hypertension (03/2020).Past Surgical History Patient  has a past surgical history that includes Pacemaker insertion; Hernia repair; and Colonoscopy w/ or w/o biopsy (06/21/2020).Family History family history is not on file.Social History Patient  reports that he has been smoking cigarettes. He has a 12.8 pack-year smoking history. He has never used smokeless tobacco. He reports current alcohol use of about 5.0 standard drinks of alcohol per week. He reports that he does not use drugs.Allergies Patient has No Known Allergies.Outpatient MedicationsCurrent Outpatient Medications Medication Sig  Miscellaneous Medical Supply (BLOOD PRESSURE CUFF) BP cuff. Medium. ICD-10: I10. Lifetime use.  nicotine (NICODERM CQ) 14 mg transdermal patch Place 1 patch (14 mg total) onto the skin daily. Alternate arms/sites when applying patch. Discard old patch when applying new one  allopurinoL (ZYLOPRIM) 300 mg tablet Take 1 tablet (300 mg total) by mouth daily.  chlorthalidone (HYGROTEN) 25 mg tablet Take 1 tablet (25 mg total) by mouth daily.  indomethacin (INDOCIN) 25 mg capsule Take 1 capsule (25 mg total) by mouth as needed for pain. No current facility-administered medications for this visit. Objective: Physical exam BP 121/78 (Site: l a, Position: Sitting, Cuff Size: Medium)  - Pulse 87  - Temp 98.4 ?F (36.9 ?C) (No-touch scanner)  - Ht 6' (1.829 m)  - Wt 79.7 kg  - SpO2 97%  - BMI 23.84 kg/m? Wt Readings from Last 3 Encounters: 07/07/22 79.7 kg 07/04/22 80.9 kg 06/27/22 79.8 kg General: No apparent distress, awake, alertHEENT: NC/AT, PERRL, no pharyngeal exudate, MMM, no LAD, no thyromegaly.  Ophthalmoscope examination limited but shows mild tortuosity of vessels in R eye. Respiratory: CTAB, no wheeze/rales, No accessory respiratory muscle useCardiovascular: RRR, normal S1S2, no MRG, no carotid bruits, no JVD, peripheral pulses intactGastrointestinal: Soft abdomen, non-tender, non-distended, normal bowel sounds, no organomegaly, no scarsNeurological: AAOx3, No focal sensory or motor deficits.MSK/Skin: No LE edema, No clubbing nor cyanosis.  No tenderness to palpation of bilateral legs. Right great toe: no swelling, tenderness to palpation, redness, erythema, drainage, nail separation present. Studies: Labs:The results of recent testing and lab work were reviewed with today's visit.Comprehensive Chemistry Panel  Chemistry  Lab Results Component Value Date  NA 138 03/20/2022  K 3.0 (L) 03/20/2022  CL 99 03/20/2022  CO2 26 03/20/2022  BUN 15 03/20/2022  CREATININE 0.97 03/20/2022  GLU 111 (H) 03/20/2022  Lab Results Component Value Date  CALCIUM 9.5 03/20/2022  ALKPHOS 118 03/20/2022  AST 26 03/20/2022  ALT 19 03/20/2022  BILITOT 0.3 03/20/2022   Lab Results Component  Value Date  HGBA1C 5.5 02/01/2021  HGBA1C 5.5 01/19/2020  HGBA1C 5.6 05/22/2019 No results found for requested labs within last 7 days. ImagingNo results found.ZOX:WRUEAVW for orders placed or performed in visit on 06/27/22 EKG - Performed during office visit)  Narrative  Please refer to MD note for interpretations. Assessment and Plan: Brett [name], is a 67 y.o. male with the following problems:# hypokalemia in the past- BMP and Mg now- May need daily K supplementation depending on K levels due to continued chlorthalidone use or switch to different agent# hypertension- continue chlorthalidone daily for now- continue home BP checks# erectile dysfunction- will continue Sildenafil 50mg - advised to stop using it if any chest pain, pressure, dizziness# hypertriglyceridemia - would like to change lifestyle prior to starting medications (will change diet, cut down on smoking)- repeat lipid panel - not done last visit # smoking - continue using nicotine patch and gum to help with smoking cessation, is currently smoking 3 cigarettes a day, down from 10, does not want to try medication currently # gout- continue current treatment with daily colchicine, allopurinol- repeat uric acid levels# healthcare maintenance - Low dose [place] screen pending, lipid panelHealth maintenance A1c, lipid panelHealth Maintenance Topic Date Due  Lung Cancer Screening  Never done  Pneumo Vaccine 65+ (1 of 2 - PCV) Never done  Shingles vaccine (Shingrix) (1 of 2 - Shingrix (RZV) 2 Dose Standard Series) Never done  Respiratory Syncytial Virus (RSV) age >8 years (1 - 49-dose 60+ series) Never done  Covid-19 vaccine series (3 - 2023-24 season) 09/30/2021  Influenza vaccine  10/01/2022  Diabetes screening  03/20/2025  Lipid disorder screening  12/20/2025  Colon cancer screening, Colonoscopy  06/22/2030  Tetanus adult (Td q 10,TDAP once)  12/14/2031  Hepatitis C screening  Completed  HIV screening  Completed  Aortic Aneurysm screening  Completed  Colon cancer screening,FIT DNA (Cologuard)  Discontinued ImmunizationImmunization History Administered Date(s) Administered  COVID-19, MODERNA 12Y+, 0.5 mL 04/12/2019, 05/10/2019  Influenza, high dose, quad, 0.7 mL, preservative free 12/13/2021  Influenza, injectable, quadrivalent, preservative free 11/13/2019, 02/01/2021  Tdap 12/13/2021 Follow-Up: Return in about 3 months (around 10/07/2022).Case and plan discussed with Dr Rosezetta Schlatter MD, attending addendum to follow. Signed: Leonor Liv, MDYale [facility]6/07/2022 at 3:24 PM

## 2022-07-26 ENCOUNTER — Telehealth: Admit: 2022-07-26 | Payer: PRIVATE HEALTH INSURANCE | Attending: Internal Medicine | Primary: Internal Medicine

## 2022-07-28 ENCOUNTER — Ambulatory Visit: Admit: 2022-07-28 | Payer: PRIVATE HEALTH INSURANCE | Primary: Internal Medicine

## 2022-08-01 ENCOUNTER — Telehealth: Admit: 2022-08-01 | Payer: PRIVATE HEALTH INSURANCE | Attending: Cardiovascular Disease | Primary: Internal Medicine

## 2022-08-04 ENCOUNTER — Ambulatory Visit: Admit: 2022-08-04 | Payer: PRIVATE HEALTH INSURANCE | Primary: Internal Medicine

## 2022-08-04 MED ORDER — TECHNETIUM TC-99M SESTAMIBI INJECTION
Freq: Once | INTRAVENOUS | Status: CP | PRN
Start: 2022-08-04 — End: ?
  Administered 2022-08-04: 14:00:00 via INTRAVENOUS

## 2022-08-04 MED ORDER — REGADENOSON 0.4 MG/5 ML INTRAVENOUS SYRINGE
0.4 | Freq: Once | INTRAVENOUS | Status: CP
Start: 2022-08-04 — End: ?
  Administered 2022-08-04: 15:00:00 0.4 mL via INTRAVENOUS

## 2022-08-04 MED ORDER — TECHNETIUM TC-99M SESTAMIBI INJECTION
Freq: Once | INTRAVENOUS | Status: CP | PRN
Start: 2022-08-04 — End: ?
  Administered 2022-08-04: 15:00:00 via INTRAVENOUS

## 2022-08-25 ENCOUNTER — Ambulatory Visit: Admit: 2022-08-25 | Payer: PRIVATE HEALTH INSURANCE | Primary: Internal Medicine

## 2022-09-05 ENCOUNTER — Encounter: Admit: 2022-09-05 | Payer: PRIVATE HEALTH INSURANCE | Attending: Cardiovascular Disease | Primary: Internal Medicine

## 2022-09-05 ENCOUNTER — Encounter: Admit: 2022-09-05 | Payer: PRIVATE HEALTH INSURANCE | Primary: Internal Medicine

## 2022-09-15 ENCOUNTER — Telehealth: Admit: 2022-09-15 | Payer: PRIVATE HEALTH INSURANCE | Attending: Internal Medicine | Primary: Internal Medicine

## 2022-09-15 NOTE — Telephone Encounter
 Patient was contacted today regarding incomplete lab work that was previously ordered. Reminded the patient of the importance of completing the lab work as soon as possible to ensure continuity of care.  - Patient indicated they will go to the [facility].

## 2022-09-20 ENCOUNTER — Encounter: Admit: 2022-09-20 | Payer: PRIVATE HEALTH INSURANCE | Attending: Internal Medicine | Primary: Internal Medicine

## 2022-09-20 DIAGNOSIS — M109 Gout, unspecified: Secondary | ICD-10-CM

## 2022-09-21 MED ORDER — ALLOPURINOL 300 MG TABLET
300 | ORAL_TABLET | Freq: Every day | ORAL | 4 refills | Status: AC
Start: 2022-09-21 — End: ?

## 2022-10-06 ENCOUNTER — Inpatient Hospital Stay: Admit: 2022-10-06 | Discharge: 2022-10-06 | Payer: PRIVATE HEALTH INSURANCE | Primary: Internal Medicine

## 2022-10-06 DIAGNOSIS — M109 Gout, unspecified: Secondary | ICD-10-CM

## 2022-10-06 DIAGNOSIS — E876 Hypokalemia: Secondary | ICD-10-CM

## 2022-10-06 DIAGNOSIS — Z0000 Encounter for general adult medical examination without abnormal findings: Secondary | ICD-10-CM

## 2022-10-06 LAB — LIPID PANEL
BKR CHOLESTEROL/HDL RATIO: 3.7 (ref 0.0–5.0)
BKR CHOLESTEROL: 189 mg/dL
BKR HDL CHOLESTEROL: 51 mg/dL (ref >=40)
BKR LDL CHOLESTEROL SAMPSON CALCULATED: 89 mg/dL (ref 3.3–5.3)
BKR TRIGLYCERIDES: 294 mg/dL — ABNORMAL HIGH

## 2022-10-06 LAB — BASIC METABOLIC PANEL
BKR ANION GAP: 10 (ref 7–17)
BKR BLOOD UREA NITROGEN: 12 mg/dL (ref 8–23)
BKR BUN / CREAT RATIO: 10.4 (ref 8.0–23.0)
BKR CALCIUM: 9.6 mg/dL (ref 8.8–10.2)
BKR CHLORIDE: 103 mmol/L (ref 98–107)
BKR CO2: 28 mmol/L (ref 20–30)
BKR CREATININE: 1.15 mg/dL (ref 0.40–1.30)
BKR EGFR, CREATININE (CKD-EPI 2021): >60 mL/min/{1.73_m2} (ref >=60)
BKR GLUCOSE: 81 mg/dL (ref 70–100)
BKR POTASSIUM: 3.4 mmol/L (ref 3.3–5.3)
BKR SODIUM: 141 mmol/L (ref 136–144)

## 2022-10-06 LAB — MAGNESIUM: BKR MAGNESIUM: 2 mg/dL (ref 1.7–2.4)

## 2022-10-06 LAB — URIC ACID: BKR URIC ACID: 5 mg/dL (ref 2.7–7.3)

## 2022-10-07 LAB — HEMOGLOBIN A1C
BKR ESTIMATED AVERAGE GLUCOSE: 117 mg/dL (ref 0.0–5.0)
BKR HEMOGLOBIN A1C: 5.7 % — ABNORMAL HIGH (ref 4.0–5.6)

## 2022-10-09 ENCOUNTER — Telehealth: Admit: 2022-10-09 | Payer: PRIVATE HEALTH INSURANCE | Primary: Internal Medicine

## 2022-10-09 NOTE — Telephone Encounter
Spoke to patient regarding A1c 5.7 and prediabetes. Normal LDL. Advised about dietary and exercise. Patient has follow up with PCP in 2 weeks.

## 2022-10-11 ENCOUNTER — Ambulatory Visit
Admit: 2022-10-11 | Payer: PRIVATE HEALTH INSURANCE | Attending: Plastic and Reconstructive Surgery | Primary: Internal Medicine

## 2022-10-11 ENCOUNTER — Encounter
Admit: 2022-10-11 | Payer: PRIVATE HEALTH INSURANCE | Attending: Plastic and Reconstructive Surgery | Primary: Internal Medicine

## 2022-10-11 DIAGNOSIS — D485 Neoplasm of uncertain behavior of skin: Secondary | ICD-10-CM

## 2022-10-11 NOTE — Progress Notes
[phone]) [phone]: 10/11/2022				Patient Name: Brett [name]		DOB: 02/11/1955			MRN: [id number]			Age: 67 y.o.			Chief Complaint:Growing skin lesion to right cheekHistory of Present Illness:Brett [name] is a 67 yo male with PMHx of tobacco use, hypertension, AV block s/p pacemaker who presents on referral from PCP for growing skin lesion to the right cheek. He was told by his dermatologist that the lesion is benign, however, it has been growing and is bothersome. States the lesion gets irritated and splints. Also is obstructing his lower field on vision and getting in the way of his glasses. This has been present for years. It is not painful and does not bleed.Location: Right cheekDuration: years, started as flat lesionGrowth: yesPain: noDrainage: noPrior treatments / interventions / antibiotics for this lesion: NoOther symptoms: Cracking, obstruction of visionOther similar lesions: noPersonal history of cancer: NoImplanted metal: NoDefibrillator: NoPacemaker: YesPersonal or family history of clotting or bleeding disorders: NoPast Medical History: Past Medical History: Diagnosis Date  2nd degree AV block   Erectile dysfunction   Gout   Hypertension 03/2020 Past Surgical History: Past Surgical History: Procedure Laterality Date  COLONOSCOPY W/ OR W/O BIOPSY  06/21/2020  Procedure: COLONOSCOPY, FLEXIBLE; W/BX, SINGLE/MULTIPLE;  Surgeon: Elie Confer, MD;  Location: [facility];  Service: Gastroenterology;;  HERNIA REPAIR    PACEMAKER INSERTION   Family History: family history is not on file.No family status information on file. Social History: Social History Socioeconomic History  Marital status: Married Tobacco Use  Smoking status: Every Day   Current packs/day: 0.25   Average packs/day: 0.3 packs/day for 51.0 years (12.8 ttl pk-yrs)   Types: Cigarettes  Smokeless tobacco: Never  Tobacco comments:   Used to smoke >1.5 PPD for >50 years, recently started cutting down Vaping Use  Vaping Use: Never used Substance and Sexual Activity  Alcohol use: Yes   Alcohol/week: 5.0 standard drinks of alcohol   Types: 5 Glasses of wine per week   Comment: occasional drinker  Drug use: Never  Sexual activity: Not Currently Allergies: No Known AllergiesMedications:Current Outpatient Medications on File Prior to Visit Medication Sig Dispense Refill  allopurinoL (ZYLOPRIM) 300 mg tablet take one tablet by mouth every day 30 tablet 3  chlorthalidone (HYGROTEN) 25 mg tablet Take 1 tablet (25 mg total) by mouth daily. 30 tablet 2  indomethacin (INDOCIN) 25 mg capsule Take 1 capsule (25 mg total) by mouth as needed for pain. 30 capsule 0  Miscellaneous Medical Supply (BLOOD PRESSURE CUFF) BP cuff. Medium. ICD-10: I10. Lifetime use. 1 each 0  nicotine (NICODERM CQ) 14 mg transdermal patch Place 1 patch (14 mg total) onto the skin daily. Alternate arms/sites when applying patch. Discard old patch when applying new one 28 patch 2 No current facility-administered medications on file prior to visit. Review of Systems: As in HPI.Physical Exam: Vitals: BP 130/82  - Pulse 78  - SpO2 99% Estimated body mass index is 23.84 kg/m? as calculated from the following:  Height as of 07/07/22: 6' (1.829 m).  Weight as of 07/07/22: 79.7 kg. Physical Exam:GENERAL APPEARANCE: well developed, well nourished, in no acute distress. LUNGS: no respiratory distress. HEART: no jugular vein distention, regular rate and rhythm. NEUROLOGIC EXAM: alert and oriented x 3. MUSCULOSKELETAL: gait normal. PSYCH: appropriate affect. Lesion: Location: R cheekDimensions: 84mmx8mmDescription: Stuck on firm, dark plaqueDrainage: noPunctum: noTenderness: noImpression/Discussion: Clinically, this lesion appears to be a seborrheic keratosis.We mutually agreed upon proceeding with an excisional biopsy.We reviewed the potential risks and benefits of this procedure including but not limited to the risks of  bleeding, infection, seroma, delayed wound healing, unsightly scar, recurrence of the lesion, and the need for more procedures.We discussed performing this in the office in 6 weeks.Plastic Surgery Plan: - He is still smoking 2-3 cigarettes per day and uses a nicotine patch. I advised that he needs to be nicotine free for 1 month prior to the procedure. He thinks he can do this. Will schedule procedure for 6 weeks from now.Further Plan: - Consent obtained- Will schedule for procedureI have spent at least the amount of time noted below on the date of the encounter to prepare to see this patient, obtain a history, perform a medically appropriate examination and evaluation, counsel the patient on this evaluation, educate the patient and/or family/caregiver, document clinical information in our EHR, and, when noted above: order medications, tests, or procedures; refer to or communicate with other healthcare professionals; independently interpret imaging studies; and perform any further care coordination.NEW PATIENT15 minutes (16109): X30 minutes (60454): 45 minutes (99204):60 minutes ([id number]): Brett Kinslow Reece Leader, PA-CPlastic and Reconstructive SurgeryYale [phone]) [phone]

## 2022-10-25 ENCOUNTER — Ambulatory Visit: Admit: 2022-10-25 | Payer: PRIVATE HEALTH INSURANCE | Attending: Internal Medicine | Primary: Internal Medicine

## 2022-10-25 ENCOUNTER — Encounter: Admit: 2022-10-25 | Payer: PRIVATE HEALTH INSURANCE | Attending: Internal Medicine | Primary: Internal Medicine

## 2022-10-25 DIAGNOSIS — I441 Atrioventricular block, second degree: Secondary | ICD-10-CM

## 2022-10-25 DIAGNOSIS — I10 Essential (primary) hypertension: Secondary | ICD-10-CM

## 2022-10-25 DIAGNOSIS — F17200 Nicotine dependence, unspecified, uncomplicated: Secondary | ICD-10-CM

## 2022-10-25 DIAGNOSIS — N529 Male erectile dysfunction, unspecified: Secondary | ICD-10-CM

## 2022-10-25 DIAGNOSIS — M109 Gout, unspecified: Secondary | ICD-10-CM

## 2022-10-25 MED ORDER — ALLOPURINOL 300 MG TABLET
300 | ORAL_TABLET | Freq: Every day | ORAL | 4 refills | Status: AC
Start: 2022-10-25 — End: ?

## 2022-10-25 MED ORDER — CHLORTHALIDONE 25 MG TABLET
25 | ORAL_TABLET | Freq: Every day | ORAL | 4 refills | Status: AC
Start: 2022-10-25 — End: ?

## 2022-10-25 MED ORDER — BUPROPION HCL XL 300 MG 24 HR TABLET, EXTENDED RELEASE
300 | ORAL_TABLET | Freq: Every morning | ORAL | 3 refills | Status: AC
Start: 2022-10-25 — End: ?

## 2022-10-25 MED ORDER — BUPROPION HCL XL 150 MG 24 HR TABLET, EXTENDED RELEASE
150 | ORAL_TABLET | Freq: Every morning | ORAL | 1 refills | Status: AC
Start: 2022-10-25 — End: ?

## 2022-10-25 NOTE — Patient Instructions
-   Follow up with PCP in 4 months- Take Bupropion 150 mg daily for 3 days then increase to 300 mg daily- Schedule  Lung Cancer Screening test- Continue the rest of your medications

## 2022-10-27 NOTE — Progress Notes
 I have personally discussed the patient with the resident physician Dr. Roselle Locus. Findings and plans as documented by the resident physician - reviewed, discussed. My addendum as follows-  67 yo M with HTN, Active smoker quit few weeks ago, 2nd degree AV block, gout presents for follow up.  Skin nodule- ?seborrheic keratosisSeen by Engineer, petroleum. Surgery needs no nicotine level in blood. Quit smoking one or two weeks but still having some cravingOrdered Bupripion bupropion for 3 months HTN- blood pressure today 112/74.  On chlorthalidone 25 mg daily Patient has history of gout and is on allopurinol 300 mg daily.  HM- LDCT yearly. PCV vaccination next visit   Md Graciella Belton, [facility] PRIMARY CARE MEDICINE CLINICSUBJECTIVE:Brett [name] is a 67 y.o. male 2nd degree AV block and RBBB s/p PPM, gout, erectile dysfunction, hypertension, history of smoking; presenting today for follow-up.Patient reports seeing Surgeon recently but surgical procedure deferred due to active smoking, he must not have any nicotine in his system so he cannot use NRT either. He quit smoking completely 1-2 weeks ago, he feels like he remain smoke free but sometimes experiences cravings, after discussing medical options to reduce cravings and checking cost at goodrx, patient agrees to try Bupropion. Otherwise, he is asymptomatic and feeling well.Review of Systems All other systems reviewed and are negative.Past Medical History: has a past medical history of 2nd degree AV block, Erectile dysfunction, Gout, and Hypertension (03/2020).Allergies: No Known AllergiesCurrent Outpatient Medications:   allopurinoL, 300 mg, Oral, Daily  buPROPion XL, 150 mg, Oral, QAM  [START ON 10/28/2022] buPROPion XL, 300 mg, Oral, QAM  chlorthalidone, 25 mg, Oral, Daily  indomethacin, 25 mg, Oral, PRN  Blood Pressure Cuff, BP cuff. Medium. ICD-10: I10. Lifetime use.Depression Screening: PHQ2 0PHQ-2 Total Score: 0 OBJECTIVE:BP 112/74 (Site: l a, Position: Sitting, Cuff Size: Medium)  - Pulse 70  - Temp 97.6 ?F (36.4 ?C) (No-touch scanner)  - Ht 6' (1.829 m)  - Wt 81.5 kg  - SpO2 98%  - BMI 24.36 kg/m? Physical ExamHENT:    Mouth/Throat:    Mouth: Mucous membranes are moist.    Pharynx: Oropharynx is clear. Eyes:    Conjunctiva/sclera: Conjunctivae normal.    Pupils: Pupils are equal, round, and reactive to light. Cardiovascular:    Rate and Rhythm: Normal rate and regular rhythm.    Pulses: Normal pulses.    Heart sounds: Normal heart sounds. Pulmonary:    Effort: Pulmonary effort is normal.    Breath sounds: Normal breath sounds. Abdominal:    General: Bowel sounds are normal.    Palpations: Abdomen is soft. Skin:   General: Skin is warm.    Capillary Refill: Capillary refill takes less than 2 seconds.    Comments: Right-side face raised skin lesion. Neurological:    General: No focal deficit present.    Mental Status: He is alert and oriented to person, place, and time. Mental status is at baseline. LABS:Lab Results Component Value Date  WBC 4.8 03/20/2022  HGB 13.9 03/20/2022  HCT 37.80 (L) 03/20/2022  MCV 96.2 03/20/2022  PLT 204 03/20/2022 Lab Results Component Value Date  CREATININE 1.15 10/06/2022  BUN 12 10/06/2022  NA 141 10/06/2022  K 3.4 10/06/2022  CL 103 10/06/2022  CO2 28 10/06/2022 Lab Results Component Value Date  GLU 81 10/06/2022  CREATININE 1.15 10/06/2022 Lab Results Component Value Date  HGBA1C 5.7 (H) 10/06/2022  HGBA1C 5.5 02/01/2021  HGBA1C 5.5 01/19/2020 ASSESSMENT & PLAN:Patient Active Problem List Diagnosis SNOMED  [place](R)  Right knee pain PAIN OF RIGHT KNEE REGION  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  Gout GOUT  Hyperuricemia HYPERURICEMIA  Hypertension HYPERTENSIVE DISORDER  Erectile dysfunction ERECTILE DYSFUNCTION  Tobacco use disorder TOBACCO USER  Hypokalemia HYPOKALEMIA Brett [name] was seen today for follow-up.Diagnoses and all orders for this visit:Tobacco use disorder-     buPROPion XL (WELLBUTRIN XL) 150 mg 24 hr tablet; Take 1 tablet (150 mg total) by mouth every morning for 3 days. Start half the dose for 3 days and then increase to a full tablet.-     [place] Initial Lung Cancer Screening; Future-     buPROPion XL (WELLBUTRIN XL) 300 mg 24 hr tablet; Take 1 tablet (300 mg total) by mouth every morning.Gout, unspecified cause, unspecified chronicity, unspecified site-     allopurinoL (ZYLOPRIM) 300 mg tablet; Take 1 tablet (300 mg total) by mouth daily.Hypertension, unspecified type-     chlorthalidone (HYGROTEN) 25 mg tablet; Take 1 tablet (25 mg total) by mouth daily.Healthcare maintenance: Health Maintenance Topic Date Due  Lung Cancer Screening  Never done  Pneumo Vaccine 65+ (1 of 2 - PCV) Never done  Shingles vaccine (Shingrix) (1 of 2 - Shingrix (RZV) 2 Dose Standard Series) Never done  Respiratory Syncytial Virus (RSV) age >51 years (1 - 40-dose 60+ series) Never done  Influenza vaccine  08/31/2022  Covid-19 vaccine series (3 - 2023-24 season) 10/01/2022  Prediabetes Surveillance  10/06/2023  Diabetes screening  10/05/2025  Lipid disorder screening  10/06/2027  Colon cancer screening, Colonoscopy  06/22/2030  Tetanus adult (Td q 10,TDAP once)  12/14/2031  Hepatitis C screening  Completed  HIV screening  Completed  Aortic Aneurysm screening  Completed  Meningococcal Vaccine  Aged Out  Colon cancer screening,FIT DNA (Cologuard)  Discontinued Immunization History Administered Date(s) Administered  COVID-19, MODERNA 12Y+, 0.5 mL 04/12/2019, 05/10/2019  Influenza, high dose, quad, 0.7 mL, preservative free 12/13/2021  Influenza, injectable, quadrivalent, preservative free 11/13/2019, 02/01/2021  Tdap 12/13/2021 Follow up: Return in 4 month(s).Next Visit: Follow [place] Lung Cancer ScreeningFollow tobacco abstinenceFollow BP logDiscussed plan of care with Attending Physician: Dr. Harold Hedge (Chief Resident)SIGNED:Delmos Velaquez Roselle Locus Diaz9/25/2024 5:30 PM

## 2022-11-20 ENCOUNTER — Encounter: Admit: 2022-11-20 | Payer: PRIVATE HEALTH INSURANCE | Primary: Internal Medicine

## 2022-11-20 NOTE — Progress Notes
 Per in basket message received:Dear ADJEPONG, YAW AMOATENG We have been unable to contact your patient, Brett [name], Brett [name], to schedule an appointment for Radiology after 2 attempts.  Please note in your records that we will not make any further attempts to contact your patient. However, if the patient calls our office we will be able to schedule an appointment per the original referral request as long as it is within 1 year from the order date. We thank you very much for referring your patients to [place]. Please let us know if we can be of further assistance.   This Clinical research associate contacted patient and provided Outpatient Radiology number to call and schedule. Per patient has not received any calls/messages.

## 2022-11-21 ENCOUNTER — Telehealth
Admit: 2022-11-21 | Payer: PRIVATE HEALTH INSURANCE | Attending: Plastic and Reconstructive Surgery | Primary: Internal Medicine

## 2022-11-21 NOTE — Telephone Encounter
 Copied from CRM #[id number]. Topic: Nursing Triage Note - [facility]>> Nov 21, 2022 12:49 PM Margaretmary Dys wrote:[facility]:  NURSE TRIAGE MESSAGETime of call:   12:49 PMCaller:   Jetta Lout for call:  patient calling to find out if this is the appt to have the mole removed , he states this is his 2nd appt, and he is not smoking any longer , please call patient and adviseBest call back number:  [phone]

## 2022-11-22 ENCOUNTER — Encounter
Admit: 2022-11-22 | Payer: PRIVATE HEALTH INSURANCE | Attending: Plastic and Reconstructive Surgery | Primary: Internal Medicine

## 2022-11-22 DIAGNOSIS — D485 Neoplasm of uncertain behavior of skin: Secondary | ICD-10-CM

## 2022-11-23 ENCOUNTER — Inpatient Hospital Stay: Admit: 2022-11-23 | Discharge: 2022-11-23 | Payer: PRIVATE HEALTH INSURANCE | Primary: Internal Medicine

## 2022-11-23 ENCOUNTER — Encounter: Admit: 2022-11-23 | Payer: PRIVATE HEALTH INSURANCE | Attending: Internal Medicine | Primary: Internal Medicine

## 2022-11-23 NOTE — Progress Notes
 Called patient to assist in scheduling [place] Scan Lungs initial, left voicemail with scheduling number as well.

## 2022-11-27 NOTE — Procedures
 [facility]([phone]) [phone]: 11/22/2022				Patient Name: Brett [name]		DOB: 01/06/1956			MRN: [id number]			Age: 68 y.o.			See H&P for further detailsProcedure Note Preoperative diagnosis: Seborrheic KeratosisPostoperative diagnosis:  Seborrheic KeratosisProcedure: Shave/ExcisionSurgeon: Advertising copywriter, MDAssistant: Timothy Lasso, PA, Cindee Salt MD; Anesthesia: 3 cc of the following mixture: 4.5 cc of 1% lidocaine with epinephrine 1:100,000, 4.5 cc of 0.25% bupivacaine, 0.4 cc of 8.4% sodium bicarbonate.EBL: minimalDescription of Procedure: Informed consent was reviewed.  Site marking was confirmed.  Preoperative antibiotics were not necessary.  The site was infiltrated with local anesthetic as noted above and allowed sufficient time to take effect.  The site was prepped with betadine and draped in the usual sterile fashion. Skin lesion shave biopsy:A shave biopsy blade was used to perform a partial thickness skin biopsy of the lesion noted above.  This was sent to pathology for further examination.  Hemostasis was assured and electrocautery was utilized.  The site was irrigated with saline/betadine, then dressed with Aquaphor and an adhesive bandage.  Length of lesion: 15 mmImpression/Discussion: Patient tolerated this procedure well without complications.Instructions/Wound Care: Leave the BandAid on for 24 hours. OK to shower and get the site wet thereafter. Change overlying adhesive bandage twice daily.  Remove the bandage, shower, cleanse the area with soap and water, air dry for 10 minutes, then reapply Aquaphor and adhesive bandage until the skin is healed.Follow Up: - Follow up in 4 week(s)- Suture removal necessary: Diego Cory, MDPlastic and Reconstructive SurgeryY[facility] [phone]) [phone]

## 2022-11-28 ENCOUNTER — Encounter: Admit: 2022-11-28 | Payer: PRIVATE HEALTH INSURANCE | Attending: Internal Medicine | Primary: Internal Medicine

## 2022-11-28 ENCOUNTER — Encounter: Admit: 2022-11-28 | Payer: PRIVATE HEALTH INSURANCE | Primary: Internal Medicine

## 2022-11-28 NOTE — Progress Notes
 Sent MyChart message with scheduling number for patient to call and schedule [place] Scan Lungs Initial Screening.

## 2022-12-05 ENCOUNTER — Encounter: Admit: 2022-12-05 | Payer: PRIVATE HEALTH INSURANCE | Primary: Internal Medicine

## 2022-12-05 ENCOUNTER — Encounter: Admit: 2022-12-05 | Payer: PRIVATE HEALTH INSURANCE | Attending: Cardiovascular Disease | Primary: Internal Medicine

## 2022-12-08 ENCOUNTER — Telehealth: Admit: 2022-12-08 | Payer: PRIVATE HEALTH INSURANCE | Attending: Internal Medicine | Primary: Internal Medicine

## 2022-12-08 NOTE — Telephone Encounter
 HTN follow up appt pt aware of appt date,time and location

## 2022-12-18 ENCOUNTER — Ambulatory Visit: Admit: 2022-12-18 | Payer: PRIVATE HEALTH INSURANCE | Attending: Internal Medicine | Primary: Internal Medicine

## 2022-12-18 ENCOUNTER — Encounter: Admit: 2022-12-18 | Payer: PRIVATE HEALTH INSURANCE | Attending: Internal Medicine | Primary: Internal Medicine

## 2022-12-18 VITALS — BP 135/80 | HR 87 | Temp 97.70000°F | Resp 18 | Ht 72.0 in | Wt 185.0 lb

## 2022-12-18 DIAGNOSIS — N529 Male erectile dysfunction, unspecified: Secondary | ICD-10-CM

## 2022-12-18 DIAGNOSIS — I10 Essential (primary) hypertension: Secondary | ICD-10-CM

## 2022-12-18 DIAGNOSIS — M109 Gout, unspecified: Secondary | ICD-10-CM

## 2022-12-18 DIAGNOSIS — M5431 Sciatica, right side: Principal | ICD-10-CM

## 2022-12-18 DIAGNOSIS — I441 Atrioventricular block, second degree: Secondary | ICD-10-CM

## 2022-12-18 DIAGNOSIS — R222 Localized swelling, mass and lump, trunk: Secondary | ICD-10-CM

## 2022-12-18 MED ORDER — IBUPROFEN 600 MG TABLET
600 | ORAL_TABLET | Freq: Four times a day (QID) | ORAL | 1 refills | Status: AC | PRN
Start: 2022-12-18 — End: ?

## 2022-12-19 ENCOUNTER — Encounter
Admit: 2022-12-19 | Payer: PRIVATE HEALTH INSURANCE | Attending: Plastic and Reconstructive Surgery | Primary: Internal Medicine
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
## 2022-12-20 ENCOUNTER — Encounter: Admit: 2022-12-20 | Payer: PRIVATE HEALTH INSURANCE | Primary: Internal Medicine

## 2022-12-20 VITALS — BP 131/79 | HR 89

## 2022-12-20 DIAGNOSIS — B079 Viral wart, unspecified: Secondary | ICD-10-CM

## 2022-12-20 NOTE — Progress Notes
 XBJ:YNWGNFA is a 67 year old male who presents for weeks after excision of a right cheek lesion.  Initially this was thought to be a seborrheic keratoses the pathology revealed this to be a verruca vulgaris.  He states he has been doing well and has been using Aquaphor on the wound twice a day has been keeping it covered with a bandage.  No issues.  He has found it relatively easy to take care.  Lesion is almost healed.  Additionally he has continued with not smoking since his procedurePathology:Verruca Vulgaris Exam:BP 131/79  - Pulse 89  - SpO2 98% Well healing circular shave biopsy site.No signs of infectionA/P:Discussed benign pathology, but this may recurDiscussed wound care and scar management Encouraged SPF Given return precautions Return prn

## 2022-12-22 ENCOUNTER — Encounter: Admit: 2022-12-22 | Payer: PRIVATE HEALTH INSURANCE | Attending: Cardiovascular Disease | Primary: Internal Medicine

## 2022-12-22 MED ORDER — ATORVASTATIN 20 MG TABLET
20 | ORAL_TABLET | Freq: Every day | ORAL | 4 refills | 90.00000 days | Status: AC
Start: 2022-12-22 — End: 2023-04-12

## 2023-01-10 ENCOUNTER — Ambulatory Visit
Admit: 2023-01-10 | Payer: PRIVATE HEALTH INSURANCE | Attending: Rehabilitative and Restorative Service Providers" | Primary: Internal Medicine

## 2023-01-17 ENCOUNTER — Ambulatory Visit
Admit: 2023-01-17 | Payer: PRIVATE HEALTH INSURANCE | Attending: Rehabilitative and Restorative Service Providers" | Primary: Internal Medicine

## 2023-01-18 ENCOUNTER — Inpatient Hospital Stay: Admit: 2023-01-18 | Discharge: 2023-01-18 | Payer: PRIVATE HEALTH INSURANCE | Primary: Internal Medicine

## 2023-01-18 DIAGNOSIS — R222 Localized swelling, mass and lump, trunk: Secondary | ICD-10-CM

## 2023-01-19 ENCOUNTER — Ambulatory Visit
Admit: 2023-01-19 | Payer: PRIVATE HEALTH INSURANCE | Attending: Rehabilitative and Restorative Service Providers" | Primary: Internal Medicine

## 2023-01-22 ENCOUNTER — Ambulatory Visit
Admit: 2023-01-22 | Payer: PRIVATE HEALTH INSURANCE | Attending: Rehabilitative and Restorative Service Providers" | Primary: Internal Medicine

## 2023-01-26 ENCOUNTER — Ambulatory Visit
Admit: 2023-01-26 | Payer: PRIVATE HEALTH INSURANCE | Attending: Rehabilitative and Restorative Service Providers" | Primary: Internal Medicine

## 2023-01-29 ENCOUNTER — Ambulatory Visit
Admit: 2023-01-29 | Payer: PRIVATE HEALTH INSURANCE | Attending: Rehabilitative and Restorative Service Providers" | Primary: Internal Medicine

## 2023-02-02 ENCOUNTER — Ambulatory Visit
Admit: 2023-02-02 | Payer: PRIVATE HEALTH INSURANCE | Attending: Rehabilitative and Restorative Service Providers" | Primary: Internal Medicine

## 2023-02-23 ENCOUNTER — Inpatient Hospital Stay: Admit: 2023-02-23 | Discharge: 2023-02-23 | Payer: PRIVATE HEALTH INSURANCE | Attending: Emergency Medicine

## 2023-02-23 ENCOUNTER — Emergency Department: Admit: 2023-02-23 | Payer: PRIVATE HEALTH INSURANCE | Primary: Internal Medicine

## 2023-02-23 DIAGNOSIS — Z28311 Partially vaccinated for covid-19: Secondary | ICD-10-CM

## 2023-02-23 DIAGNOSIS — M549 Dorsalgia, unspecified: Secondary | ICD-10-CM

## 2023-02-23 DIAGNOSIS — R61 Generalized hyperhidrosis: Secondary | ICD-10-CM

## 2023-02-23 DIAGNOSIS — R079 Chest pain, unspecified: Principal | ICD-10-CM

## 2023-02-23 DIAGNOSIS — Z950 Presence of cardiac pacemaker: Secondary | ICD-10-CM

## 2023-02-23 LAB — COMPREHENSIVE METABOLIC PANEL
BKR A/G RATIO: 1.2 (ref 1.0–2.2)
BKR ALANINE AMINOTRANSFERASE (ALT): 31 U/L (ref 9–59)
BKR ALBUMIN: 4.5 g/dL (ref 3.6–5.1)
BKR ALKALINE PHOSPHATASE: 96 U/L (ref 9–122)
BKR ANION GAP: 11 (ref 7–17)
BKR ASPARTATE AMINOTRANSFERASE (AST): 37 U/L — ABNORMAL HIGH (ref 10–35)
BKR AST/ALT RATIO: 1.2
BKR BILIRUBIN TOTAL: 0.7 mg/dL (ref <=1.2)
BKR BLOOD UREA NITROGEN: 16 mg/dL (ref 8–23)
BKR BUN / CREAT RATIO: 13.3 (ref 8.0–23.0)
BKR CALCIUM: 9.6 mg/dL (ref 8.8–10.2)
BKR CHLORIDE: 100 mmol/L (ref 98–107)
BKR CO2: 26 mmol/L (ref 20–30)
BKR CREATININE DELTA: 0.05
BKR CREATININE: 1.2 mg/dL (ref 0.40–1.30)
BKR EGFR, CREATININE (CKD-EPI 2021): >60 mL/min/{1.73_m2} (ref >=60)
BKR GLOBULIN: 3.8 g/dL (ref 2.0–3.9)
BKR GLUCOSE: 100 mg/dL (ref 70–100)
BKR POTASSIUM: 3.8 mmol/L (ref 3.3–5.3)
BKR PROTEIN TOTAL: 8.3 g/dL (ref 5.9–8.3)
BKR SODIUM: 137 mmol/L (ref 136–144)

## 2023-02-23 LAB — CBC WITH AUTO DIFFERENTIAL
BKR WAM ABSOLUTE IMMATURE GRANULOCYTES.: 0.02 x 1000/ÂµL (ref 0.00–0.30)
BKR WAM ABSOLUTE LYMPHOCYTE COUNT.: 1.13 x 1000/ÂµL (ref 0.60–3.70)
BKR WAM ABSOLUTE NRBC (2 DEC): 0 x 1000/ÂµL (ref 0.00–1.00)
BKR WAM ANC (ABSOLUTE NEUTROPHIL COUNT): 3.25 x 1000/ÂµL (ref 2.00–7.60)
BKR WAM BASOPHIL ABSOLUTE COUNT.: 0.02 x 1000/ÂµL (ref 0.00–1.00)
BKR WAM BASOPHILS: 0.4 % (ref 0.0–1.4)
BKR WAM EOSINOPHIL ABSOLUTE COUNT.: 0.11 x 1000/ÂµL (ref 0.00–1.00)
BKR WAM EOSINOPHILS: 2.2 % (ref 0.0–5.0)
BKR WAM HEMATOCRIT (2 DEC): 47.9 % (ref 38.50–50.00)
BKR WAM HEMOGLOBIN: 16.9 g/dL (ref 13.2–17.1)
BKR WAM IMMATURE GRANULOCYTES: 0.4 % (ref 0.0–1.0)
BKR WAM LYMPHOCYTES: 22.4 % (ref 17.0–50.0)
BKR WAM MCH (PG): 35 pg — ABNORMAL HIGH (ref 27.0–33.0)
BKR WAM MCHC: 35.3 g/dL (ref 31.0–36.0)
BKR WAM MCV: 99.2 fL (ref 80.0–100.0)
BKR WAM MONOCYTE ABSOLUTE COUNT.: 0.51 x 1000/ÂµL (ref 0.00–1.00)
BKR WAM MONOCYTES: 10.1 % (ref 4.0–12.0)
BKR WAM MPV: 10.4 fL (ref 8.0–12.0)
BKR WAM NEUTROPHILS: 64.5 % (ref 39.0–72.0)
BKR WAM NUCLEATED RED BLOOD CELLS: 0 % (ref 0.0–1.0)
BKR WAM PLATELETS: 184 x1000/ÂµL (ref 150–420)
BKR WAM RDW-CV: 12.4 % (ref 11.0–15.0)
BKR WAM RED BLOOD CELL COUNT.: 4.83 M/ÂµL (ref 4.00–6.00)
BKR WAM WHITE BLOOD CELL COUNT: 5 x1000/ÂµL (ref 4.0–11.0)

## 2023-02-23 LAB — TROPONIN T HIGH SENSITIVITY, 0 HOUR BASELINE WITH REFLEX (BH GH LMW YH): BKR TROPONIN T HS 0 HOUR BASELINE: 11 ng/L

## 2023-02-23 LAB — TROPONIN T HIGH SENSITIVITY, 1 HOUR WITH REFLEX (BH GH LMW YH)
BKR TROPONIN T HS 1 HOUR DELTA FROM 0 HOUR: -1 ng/L
BKR TROPONIN T HS 1 HOUR: 10 ng/L

## 2023-02-23 NOTE — ED Provider Notes
 Chief Complaint Patient presents with  Chest Pain   Intermittent left sided chest pain radiating to back for x2 days. +SOB. Intermittent episodes of diaphoresis. Reporting generalized fatigue for past week. HPI/PE:1 WEEK HX C/O EPISODIC L PARASTERNAL sharp chest pain lasting for a second when he walks and resolving with  standing still. States when he is at rest he has no pain with deep inspiration. N cough/uri sx. No known cad. Hx pacer placement for 2nd degree heart block.No recent uri sx. No hx trauma.Chest pain mdm:msk vs acs Doubt pe/aortic dissection/pntx  Physical ExamED Triage Vitals [02/23/23 1326]BP: (!) 139/90Pulse: (!) 94Pulse from  O2 sat: n/aResp: 20Temp: 97 ?F (36.1 ?C)Temp src: TemporalSpO2: 95 % BP (!) 158/92  - Pulse 72  - Temp 97 ?F (36.1 ?C) (Temporal)  - Resp 18  - Wt 83.9 kg (185 lb)  - SpO2 100%  - BMI 25.09 kg/m? Physical ExamConstitutional:     General: He is not in acute distress.   Appearance: He is not ill-appearing or toxic-appearing. HENT:    Head: Normocephalic. Eyes:    Pupils: Pupils are equal, round, and reactive to light. Cardiovascular:    Rate and Rhythm: Normal rate.    Heart sounds:    No systolic murmur is present. Pulmonary:    Effort: Pulmonary effort is normal. Abdominal:    Palpations: Abdomen is soft. Musculoskeletal:       General: Normal range of motion.    Cervical back: Normal range of motion. Skin:   General: Skin is warm.    Findings: No rash. Neurological:    Mental Status: He is alert.  ProceduresAttestation/Critical CareClinical Impressions as of 02/23/23 2207 Chest pain, unspecified type  ED DispositionDischarge  Smitty Knudsen, MD01/24/25 2207

## 2023-02-23 NOTE — ED Notes
 75:73 PM 68 year old male presents to the ED for chest pain and lethargy. Pt reports symptoms started 1 week ago. Pt endorses chest pain on the L side of his chest, endorsing tightness, pt reports that rest seemed to make the pain better. Pt endorses that he's also been having increased lethargy, and bloating. Pt endorses no recent illness. No N/V/D. Pt also reporting blood in his stool as well that started this AM. Pt reports seeing a lot of blood in the toilet. Pt reports increased flatulence and bloating.

## 2023-02-23 NOTE — ED Triage Note
 I evaluated this patient as the Physician in triage and completed a medical screening examination.  Patient will need further care.  Initial care orders will be placed as appropriate.Intermittent CP and diaphoresis worse with activity. Rectal bleeding for past 2 days- h/o hemorrhoids, polyps and rectal varices. NO blood thinners. Last stress was neg 07/2022.Brett [name], MD1:28 PM

## 2023-02-26 ENCOUNTER — Encounter: Admit: 2023-02-26 | Payer: PRIVATE HEALTH INSURANCE | Attending: Internal Medicine | Primary: Internal Medicine

## 2023-02-26 ENCOUNTER — Ambulatory Visit: Admit: 2023-02-26 | Payer: PRIVATE HEALTH INSURANCE | Attending: Internal Medicine | Primary: Internal Medicine

## 2023-02-26 VITALS — BP 139/73 | HR 99 | Temp 98.20000°F | Ht 72.0 in | Wt 184.0 lb

## 2023-02-26 DIAGNOSIS — I10 Essential (primary) hypertension: Principal | ICD-10-CM

## 2023-02-26 DIAGNOSIS — M109 Gout, unspecified: Secondary | ICD-10-CM

## 2023-02-26 DIAGNOSIS — I441 Atrioventricular block, second degree: Secondary | ICD-10-CM

## 2023-02-26 DIAGNOSIS — N529 Male erectile dysfunction, unspecified: Secondary | ICD-10-CM

## 2023-02-26 MED ORDER — ALLOPURINOL 300 MG TABLET
300 | ORAL_TABLET | Freq: Every day | ORAL | 4 refills | Status: AC
Start: 2023-02-26 — End: ?

## 2023-02-26 MED ORDER — CHLORTHALIDONE 25 MG TABLET
25 | ORAL_TABLET | Freq: Every day | ORAL | 4 refills | Status: AC
Start: 2023-02-26 — End: ?

## 2023-02-26 NOTE — Progress Notes
 [facility] PRIMARY CARE MEDICINE CLINICSUBJECTIVE:Brett [name] is a 68 y.o. male presenting today for follow up. He has PMH of HTN, hx of smoking, 2nd degree AV block, gout.Last seen in clinic 11/24- had sciatica referred to PT, [place] for lipoma ordered- shows lipomaInterim historySaw EP cardiologistHad an ER visit for chest pain- discharged as MSK pain. Now improvedHTNBP today:139/73BP at home:120-130S/70-80SCurrently on: Chlorthalidone, allopurinolUrine UJW:JXBJY organ complications: No prior CVA, CADRecent Vitals Readings     02/23/2023 02/23/2023 02/26/2023   BP: 139/90 158/92 139/73   Pulse: 94 72 99   HCMQuit smoking.Declined PCV vaccineReview of Systems All other systems reviewed and are negative.Past Medical History: has a past medical history of 2nd degree AV block, Erectile dysfunction, Gout, and Hypertension (03/2020).Allergies: No Known AllergiesCurrent Outpatient Medications:   allopurinoL, 300 mg, Oral, Daily  atorvastatin, 20 mg, Oral, Daily (Patient not taking: Reported on 02/26/2023)  chlorthalidone, 25 mg, Oral, Daily  Blood Pressure Cuff, BP cuff. Medium. ICD-10: I10. Lifetime use.Depression Screening: PHQ2   OBJECTIVE:BP 139/73 (Site: l a, Position: Sitting, Cuff Size: Medium)  - Pulse (!) 99  - Temp 98.2 ?F (36.8 ?C) (No-touch scanner)  - Ht 6' (1.829 m)  - Wt 83.5 kg  - SpO2 95%  - BMI 24.95 kg/m? Physical ExamConstitutional:     Appearance: He is normal weight. HENT:    Nose: Nose normal.    Mouth/Throat:    Mouth: Mucous membranes are moist. Eyes:    Pupils: Pupils are equal, round, and reactive to light. Cardiovascular:    Rate and Rhythm: Normal rate and regular rhythm.    Pulses: Normal pulses.    Heart sounds: Normal heart sounds. Pulmonary:    Effort: Pulmonary effort is normal.    Breath sounds: Normal breath sounds. Abdominal:    General: Bowel sounds are normal. Palpations: Abdomen is soft. Musculoskeletal:    Right lower leg: No edema.    Left lower leg: No edema. Neurological:    General: No focal deficit present.    Mental Status: He is alert. Foot Exam:LABS:Lab Results Component Value Date  WBC 5.0 02/23/2023  HGB 16.9 02/23/2023  HCT 47.90 02/23/2023  MCV 99.2 02/23/2023  PLT 184 02/23/2023 Lab Results Component Value Date  CREATININE 1.20 02/23/2023  BUN 16 02/23/2023  NA 137 02/23/2023  K 3.8 02/23/2023  CL 100 02/23/2023  CO2 26 02/23/2023 Lab Results Component Value Date  GLU 100 02/23/2023  CREATININE 1.20 02/23/2023 Lab Results Component Value Date  HGBA1C 5.7 (H) 10/06/2022  HGBA1C 5.5 02/01/2021  HGBA1C 5.5 01/19/2020 The ASCVD Risk score (Arnett DK, et al., 2019) failed to calculate for the following reasons:  Risk score cannot be calculated because patient has a medical history suggesting prior/existing ASCVDASSESSMENT & PLAN:Patient Active Problem List Diagnosis SNOMED [place](R)  Right knee pain PAIN OF RIGHT KNEE REGION  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  No complaints to report.  Refills for medications provided.  Blood pressure within goal.  Not willing for pneumococcal shot.Hypertension, unspecified type-     chlorthalidone (HYGROTEN) 25 mg tablet; Take 1 tablet (25 mg total) by mouth daily.Gout, unspecified cause, unspecified chronicity, unspecified site-     allopurinoL (ZYLOPRIM) 300 mg tablet; Take 1 tablet (300 mg total) by  mouth daily.Healthcare maintenance: Health Maintenance Topic Date Due  Shingles vaccine (Shingrix) (1 of 2 - Shingrix (RZV) 2 Dose Standard Series) Never done  Lung Cancer Screening  Never done  RSV Discussion (1 - Risk 60-74 years 1-dose series) Never done  Pneumo Vaccine 65+ (1 of 1 - PCV) Never done  Influenza vaccine  08/31/2022  Covid-19 vaccine series (3 - 2024-25 season) 10/01/2022  Prediabetes Surveillance  10/06/2023  Diabetes screening  02/22/2026  Lipid disorder screening  10/06/2027  Colon cancer screening, Colonoscopy  06/22/2030  Tetanus adult (Td q 10,TDAP once)  12/14/2031  Hepatitis C screening  Completed  HIV screening  Completed  Aortic Aneurysm screening  Completed  Meningococcal Vaccine  Aged Out  Colon cancer screening,FIT DNA (Cologuard)  Discontinued Immunization History Administered Date(s) Administered  Influenza, high dose, quad, 0.7 mL, preservative free 12/13/2021  Influenza, injectable, quadrivalent, preservative free 11/13/2019, 02/01/2021  Tdap 12/13/2021 Follow up: Return in 4 month(s).Next Visit: Check BP log	Address HCMDiscussed plan of care with Attending Physician: Dr. Ronne Binning Brett Vasudevan1/26/2025 1:54 PM

## 2023-02-26 NOTE — Patient Instructions
 No changes in medicationsWill see you in 4 months

## 2023-03-06 ENCOUNTER — Encounter: Admit: 2023-03-06 | Payer: PRIVATE HEALTH INSURANCE | Attending: Cardiovascular Disease | Primary: Internal Medicine

## 2023-03-06 DIAGNOSIS — Z950 Presence of cardiac pacemaker: Secondary | ICD-10-CM

## 2023-04-12 ENCOUNTER — Ambulatory Visit: Admit: 2023-04-12 | Payer: PRIVATE HEALTH INSURANCE | Attending: Internal Medicine | Primary: Internal Medicine

## 2023-04-12 ENCOUNTER — Encounter: Admit: 2023-04-12 | Payer: PRIVATE HEALTH INSURANCE | Attending: Internal Medicine | Primary: Internal Medicine

## 2023-04-12 VITALS — BP 124/75 | HR 89 | Temp 97.60000°F | Ht 72.0 in | Wt 186.0 lb

## 2023-04-12 DIAGNOSIS — I10 Essential (primary) hypertension: Secondary | ICD-10-CM

## 2023-04-12 DIAGNOSIS — M109 Gout, unspecified: Secondary | ICD-10-CM

## 2023-04-12 DIAGNOSIS — Z122 Encounter for screening for malignant neoplasm of respiratory organs: Secondary | ICD-10-CM

## 2023-04-12 DIAGNOSIS — N529 Male erectile dysfunction, unspecified: Secondary | ICD-10-CM

## 2023-04-12 DIAGNOSIS — I441 Atrioventricular block, second degree: Secondary | ICD-10-CM

## 2023-04-12 DIAGNOSIS — E785 Hyperlipidemia, unspecified: Secondary | ICD-10-CM

## 2023-04-12 MED ORDER — CHLORTHALIDONE 25 MG TABLET
25 | ORAL_TABLET | Freq: Every day | ORAL | 4 refills | Status: AC
Start: 2023-04-12 — End: ?

## 2023-04-12 MED ORDER — ALLOPURINOL 300 MG TABLET
300 | ORAL_TABLET | Freq: Every day | ORAL | 4 refills | Status: AC
Start: 2023-04-12 — End: ?

## 2023-04-12 MED ORDER — ATORVASTATIN 20 MG TABLET
20 | ORAL_TABLET | Freq: Every day | ORAL | 4 refills | Status: AC
Start: 2023-04-12 — End: ?

## 2023-04-12 NOTE — Progress Notes
 [facility] PRIMARY CARE MEDICINE CLINICSUBJECTIVE:Brett [name] is a 68 y.o. male presenting today for follow up. He has PMH of HTN, hx of smoking, 2nd degree AV block, gout.Interim historyFeels well, no complaintsHTNBP today:125/75BP at home:120-130/70-80SCurrently on: ChlorthalidoneUrine GNF:AOZHY organ complications: No prior CVA, CADRecent Vitals Readings     02/23/2023 02/23/2023 02/26/2023 04/12/2023  BP: 139/90 158/92 139/73 124/75  Pulse: 94 72 99 89  HCMQuit smoking.Declined vaccines                                                        LDCT Lung Cancer Screening Eligibility and Shared Decision discussionThis patient has participated in a shared decision making session (using shared decision making aid) during which potential risks and benefits of [place] lung screening were explained including the possibility of finding abnormalities other than cancer that may require additional testing and/or procedures.	The patient was informed that low dose lung screening [place] scans expose patients to about ? of the radiation of a normal [place] scan.	The patient was informed of the importance of adherence to annual screening, impact of comorbidities, and ability/willingness to undergo treatment.	The patient was informed of the importance of smoking cessation and/or maintaining smoking abstinence.	Patient has at least a 30 pack year smoking history	The patient has no signs or symptoms of lung cancer (such as fever, chest pain, new shortness of breath, new or changing cough, coughing up blood, or unexplained significant weight loss).Review of Systems All other systems reviewed and are negative.Past Medical History: has a past medical history of 2nd degree AV block, Erectile dysfunction, Gout, and Hypertension (03/2020).Allergies: No Known AllergiesCurrent Outpatient Medications:   allopurinoL, 300 mg, Oral, Daily  atorvastatin, 20 mg, Oral, Daily  chlorthalidone, 25 mg, Oral, Daily  Blood Pressure Cuff, BP cuff. Medium. ICD-10: I10. Lifetime use.Depression Screening: PHQ2 PHQ-2 Total Score: 0 OBJECTIVE:BP 124/75 (Site: l a, Position: Sitting, Cuff Size: Medium)  - Pulse 89  - Temp 97.6 ?F (36.4 ?C) (No-touch scanner)  - Ht 6' (1.829 m)  - Wt 84.4 kg  - SpO2 97%  - BMI 25.23 kg/m? Physical ExamConstitutional:     Appearance: He is normal weight. HENT:    Nose: Nose normal.    Mouth/Throat:    Mouth: Mucous membranes are moist. Eyes:    Pupils: Pupils are equal, round, and reactive to light. Cardiovascular:    Rate and Rhythm: Normal rate and regular rhythm.    Pulses: Normal pulses.    Heart sounds: Normal heart sounds. Pulmonary:    Effort: Pulmonary effort is normal.    Breath sounds: Normal breath sounds. Abdominal:    General: Bowel sounds are normal.    Palpations: Abdomen is soft. Musculoskeletal:    Right lower leg: No edema.    Left lower leg: No edema. Neurological:    General: No focal deficit present.    Mental Status: He is alert. Foot Exam:LABS:Lab Results Component Value Date  WBC 5.0 02/23/2023  HGB 16.9 02/23/2023  HCT 47.90 02/23/2023  MCV 99.2 02/23/2023  PLT 184 02/23/2023 Lab Results Component Value Date  CREATININE 1.20 02/23/2023  BUN 16 02/23/2023  NA 137 02/23/2023  K 3.8 02/23/2023  CL 100 02/23/2023  CO2 26 02/23/2023 Lab Results Component Value Date  GLU 100 02/23/2023  CREATININE 1.20 02/23/2023 Lab Results Component Value Date  HGBA1C 5.7 (H) 10/06/2022  HGBA1C 5.5  02/01/2021  HGBA1C 5.5 01/19/2020 The ASCVD Risk score (Arnett DK, et al., 2019) failed to calculate for the following reasons:  Risk score cannot be calculated because patient has a medical history suggesting prior/existing ASCVDASSESSMENT & PLAN:Patient Active Problem List Diagnosis SNOMED [place](R)  Right knee pain PAIN OF RIGHT KNEE REGION  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 Gout, unspecified cause, unspecified chronicity, unspecified site- allopurinoL (ZYLOPRIM) 300 mg tablet; Take 1 tablet (300 mg total) by mouth daily.  Dispense: 30 tablet; Refill: 32. Hypertension, unspecified type- [place] Initial Lung Cancer Screening; Future- chlorthalidone (HYGROTEN) 25 mg tablet; Take 1 tablet (25 mg total) by mouth daily.  Dispense: 30 tablet; Refill: 33. Screening for lung cancer-  Initial Lung Cancer Screening; Future4. Hyperlipidemia, unspecified hyperlipidemia type- atorvastatin (LIPITOR) 20 mg tablet; Take 1 tablet (20 mg total) by mouth daily.  Dispense: 90 tablet; Refill: 3 Healthcare maintenance: Health Maintenance Topic Date Due  Shingles vaccine (Shingrix) (1 of 2 - Shingrix (RZV) 2 Dose Standard Series) Never done  Lung Cancer Screening  Never done  RSV Discussion (1 - Risk 60-74 years 1-dose series) Never done  Pneumococcal Vaccine (50+ years) (1 of 1 - PCV) Never done  Influenza vaccine  08/31/2022  Covid-19 vaccine series (3 - 2024-25 season) 10/01/2022  Prediabetes Surveillance  10/06/2023  Diabetes screening  02/22/2026  Lipid disorder screening  10/06/2027  Colon cancer screening, Colonoscopy  06/22/2030  Tetanus adult (Td q 10,TDAP once)  12/14/2031  Hepatitis C screening  Completed  HIV screening  Completed  Aortic Aneurysm screening  Completed  Meningococcal Vaccine  Aged Out  Colon cancer screening,FIT DNA (Cologuard)  Discontinued Immunization History Administered Date(s) Administered  Influenza, high dose, quad, 0.7 mL, preservative free 12/13/2021  Influenza, injectable, quadrivalent, preservative free 11/13/2019, 02/01/2021 Tdap 12/13/2021 Follow up: Return in about 2 months (around 06/12/2023).Next Visit: Check BP log	Address HCMDiscussed plan of care with Attending Physician: Dr. Sung Amabile, MD PGY-3, Internal Medicine3/13/20251:23 PM

## 2023-05-12 ENCOUNTER — Encounter: Admit: 2023-05-12 | Payer: PRIVATE HEALTH INSURANCE | Attending: Internal Medicine | Primary: Internal Medicine

## 2023-05-12 DIAGNOSIS — I10 Essential (primary) hypertension: Secondary | ICD-10-CM

## 2023-05-15 ENCOUNTER — Encounter: Admit: 2023-05-15 | Payer: PRIVATE HEALTH INSURANCE | Attending: Internal Medicine | Primary: Internal Medicine

## 2023-05-15 DIAGNOSIS — I10 Essential (primary) hypertension: Secondary | ICD-10-CM

## 2023-06-03 ENCOUNTER — Encounter: Admit: 2023-06-03 | Payer: PRIVATE HEALTH INSURANCE | Primary: Internal Medicine

## 2023-06-08 ENCOUNTER — Ambulatory Visit: Admit: 2023-06-08 | Payer: PRIVATE HEALTH INSURANCE | Attending: Internal Medicine | Primary: Internal Medicine

## 2023-06-12 ENCOUNTER — Ambulatory Visit: Admit: 2023-06-12 | Payer: PRIVATE HEALTH INSURANCE | Attending: Internal Medicine | Primary: Internal Medicine

## 2023-06-12 ENCOUNTER — Encounter: Admit: 2023-06-12 | Payer: PRIVATE HEALTH INSURANCE | Attending: Internal Medicine | Primary: Internal Medicine

## 2023-06-12 VITALS — BP 122/78 | HR 82 | Temp 97.20000°F | Ht 72.0 in | Wt 182.0 lb

## 2023-06-12 DIAGNOSIS — N529 Male erectile dysfunction, unspecified: Secondary | ICD-10-CM

## 2023-06-12 DIAGNOSIS — I10 Essential (primary) hypertension: Secondary | ICD-10-CM

## 2023-06-12 DIAGNOSIS — M109 Gout, unspecified: Secondary | ICD-10-CM

## 2023-06-12 DIAGNOSIS — I441 Atrioventricular block, second degree: Secondary | ICD-10-CM

## 2023-06-12 DIAGNOSIS — E785 Hyperlipidemia, unspecified: Secondary | ICD-10-CM

## 2023-06-12 MED ORDER — ALLOPURINOL 300 MG TABLET
300 | ORAL_TABLET | Freq: Every day | ORAL | 4 refills | 14.00000 days | Status: AC
Start: 2023-06-12 — End: ?

## 2023-06-12 MED ORDER — CHLORTHALIDONE 25 MG TABLET
25 | ORAL_TABLET | Freq: Every day | ORAL | 4 refills | 30.00000 days | Status: AC
Start: 2023-06-12 — End: ?

## 2023-06-12 MED ORDER — ATORVASTATIN 20 MG TABLET
20 | ORAL_TABLET | Freq: Every day | ORAL | 4 refills | 75.00000 days | Status: AC
Start: 2023-06-12 — End: ?

## 2023-06-13 NOTE — Progress Notes
 [facility]IC[facility]IC[facility] provided a concise overview of the ambient note generation solution. Brett [name] or their legally authorized representative verbally consented to a temporary audio recording of their visit to assist with completing the visit documentation using an AI-powered solution. This note was reviewed for accuracy by Brett Lias, MD who performed the clinical service. SUBJECTIVE:Brett [name] is a 68 y.o. male presenting today for follow up. He has PMH of HTN, hx of smoking, 2nd degree AV block, gout.History of Present IllnessDarrell Wayne [name] presents with acute concern of back pain following a fall from a ladder from approximately 3 ft high.He experiences back pain characterized by tightness and stiffness, particularly in the area where he landed on the grass after falling from a ladder. The fall occurred from a low height, and initially, the pain was not bothersome. However, five to six days post-fall, the back started tightening up, leading to a slow walking pace due to fatigue. No bowel or bladder incontinence. Denies any numbness, tingling, or muscle weakness. He does not experience pain that wakes him up at night.He is currently working and engages in stretching exercises as part of his job. He takes three medications regularly, though specific names and dosages are not mentioned. His blood pressure is 122/73 mmHg and has been good at home, although he occasionally forgets to take his medication.He quit smoking approximately six months ago and reports no cravings since quitting.He reports a bowel movement yesterday, which was hard and caused discomfort, but he is not currently using any treatment for this. Wants to manage constipation on his own with diet. HCMQuit smoking.Declined vaccinesLDCT pending Review of Systems All other systems reviewed and are negative.Past Medical History: has a past medical history of 2nd degree AV block, Erectile dysfunction, Gout, and Hypertension (03/2020).Allergies: No Known AllergiesCurrent Outpatient Medications:   allopurinoL , 300 mg, Oral, Daily  atorvastatin , 20 mg, Oral, Daily  chlorthalidone , 25 mg, Oral, Daily  Blood Pressure Cuff, BP cuff. Medium. ICD-10: I10. Lifetime use.Depression Screening: PHQ2 PHQ-2 Total Score: 0 OBJECTIVE:BP 122/78 (Site: l a, Position: Sitting, Cuff Size: Medium)  - Pulse 82  - Temp 97.2 ?F (36.2 ?C) (Temporal)  - Ht 6' (1.829 m)  - Wt 82.6 kg  - SpO2 98%  - BMI 24.68 kg/m? Physical ExamConstitutional:     Appearance: He is normal weight. HENT:    Nose: Nose normal.    Mouth/Throat:    Mouth: Mucous membranes are moist. Eyes:    Pupils: Pupils are equal, round, and reactive to light. Cardiovascular:    Rate and Rhythm: Normal rate and regular rhythm.    Pulses: Normal pulses.    Heart sounds: Normal heart sounds. Pulmonary:    Effort: Pulmonary effort is normal.    Breath sounds: Normal breath sounds. Abdominal:    General: Bowel sounds are normal.    Palpations: Abdomen is soft. Musculoskeletal:      Back:   Right lower leg: No edema.    Left lower leg: No edema.    Comments: Muscle stiffness and mild discomfort on palpation. No spasms.  Neurological:    General: No focal deficit present.    Mental Status: He is alert. LABS:Lab Results Component Value Date  WBC 5.0 02/23/2023  HGB 16.9 02/23/2023  HCT 47.90 02/23/2023  MCV 99.2 02/23/2023  PLT 184 02/23/2023 Lab Results Component Value Date  CREATININE 1.20 02/23/2023  BUN 16 02/23/2023  NA 137 02/23/2023  K 3.8 02/23/2023  CL 100 02/23/2023  CO2 26 02/23/2023  Lab Results Component Value Date  GLU 100 02/23/2023  CREATININE 1.20 02/23/2023 Lab Results Component Value Date  HGBA1C 5.7 (H) 10/06/2022  HGBA1C 5.5 02/01/2021  HGBA1C 5.5 01/19/2020 ASSESSMENT & PLAN:Patient Active Problem List Diagnosis SNOMED [place](R)  Right knee pain PAIN OF RIGHT KNEE REGION  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 Gout, unspecified cause, unspecified chronicity, unspecified site- allopurinoL  (ZYLOPRIM ) 300 mg tablet; Take 1 tablet (300 mg total) by mouth daily.  Dispense: 30 tablet; Refill: 32. Hyperlipidemia, unspecified hyperlipidemia type- atorvastatin  (LIPITOR) 20 mg tablet; Take 1 tablet (20 mg total) by mouth daily.  Dispense: 90 tablet; Refill: 33. Hypertension, unspecified type- chlorthalidone  (HYGROTEN) 25 mg tablet; Take 1 tablet (25 mg total) by mouth daily.  Dispense: 30 tablet; Refill: 34. Back painWould like to manage conservatively on his own without assistance for now. Discussed red flags and indications for immediate evaluation/treatment.  Healthcare maintenance: Health Maintenance Topic Date Due  Shingles vaccine (Shingrix) (1 of 2 - Shingrix (RZV) 2 Dose Standard Series) Never done  Lung Cancer Screening  Never done  Pneumococcal Vaccine (50+ years) (1 of 1 - PCV) Never done  RSV Immunization (1 - Risk 60-74 years 1-dose series) Never done  Covid-19 vaccine series (3 - 2024-25 season) 10/01/2022  Influenza vaccine  10/01/2023  Prediabetes Surveillance  10/06/2023  Diabetes screening  02/22/2026  Lipid disorder screening  10/06/2027  Colon cancer screening, Colonoscopy  06/22/2030  Tetanus adult (Td q 10,TDAP once)  12/14/2031  Hepatitis C screening  Completed  HIV screening  Completed  Aortic Aneurysm screening  Completed  Meningococcal Vaccine  Aged Out  Colon cancer screening,FIT DNA (Cologuard)  Discontinued Immunization History Administered Date(s) Administered  Influenza, high dose, quad, 0.7 mL, preservative free 12/13/2021  Influenza, injectable, quadrivalent, preservative free 11/13/2019, 02/01/2021  Tdap 12/13/2021 Follow up: Return in about 4 months (around 10/13/2023).Next Visit: Check BP log	Address HCM, LDCT should be completed by next visitDiscussed plan of care with Attending Physician: Dr. Harlene Lie, MD PGY-3, Internal Medicine5/13/20252:36 PM

## 2023-06-22 ENCOUNTER — Encounter: Admit: 2023-06-22 | Payer: PRIVATE HEALTH INSURANCE | Attending: Cardiovascular Disease | Primary: Internal Medicine

## 2023-08-02 ENCOUNTER — Encounter: Admit: 2023-08-02 | Payer: PRIVATE HEALTH INSURANCE | Attending: Internal Medicine | Primary: Internal Medicine

## 2023-08-02 ENCOUNTER — Ambulatory Visit: Admit: 2023-08-02 | Payer: Managed Care (Private) | Attending: Internal Medicine | Primary: Internal Medicine

## 2023-08-02 VITALS — BP 118/71 | HR 82 | Ht 72.0 in | Wt 179.4 lb

## 2023-08-02 DIAGNOSIS — H9190 Unspecified hearing loss, unspecified ear: Principal | ICD-10-CM

## 2023-08-02 DIAGNOSIS — M109 Gout, unspecified: Principal | ICD-10-CM

## 2023-08-02 DIAGNOSIS — I441 Atrioventricular block, second degree: Secondary | ICD-10-CM

## 2023-08-02 DIAGNOSIS — N529 Male erectile dysfunction, unspecified: Secondary | ICD-10-CM

## 2023-08-02 DIAGNOSIS — H9012 Conductive hearing loss, unilateral, left ear, with unrestricted hearing on the contralateral side: Secondary | ICD-10-CM

## 2023-08-02 DIAGNOSIS — H6122 Impacted cerumen, left ear: Secondary | ICD-10-CM

## 2023-08-02 DIAGNOSIS — I10 Essential (primary) hypertension: Secondary | ICD-10-CM

## 2023-08-02 MED ORDER — CARBAMIDE PEROXIDE 6.5 % EAR DROPS
6.5 | Freq: Two times a day (BID) | OTIC | 3 refills | 15.00000 days | Status: AC
Start: 2023-08-02 — End: ?

## 2023-08-02 NOTE — Progress Notes
 [facility] PRIMARY CARE MEDICINE CLINICSUBJECTIVE:Brett [name] is a 68 y.o. male PMH of HTN, hx of smoking, 2nd degree AV block, gout presenting today for acute visit.He has experienced a muffled sensation in his left ear for the past three to four weeks, which began suddenly, akin to the sensation of an ear popping and then becoming blocked. He describes the sensation as 'muffled' and feels like the ear is blocked. He has been using earwax drops for about two weeks, applying them four times a week, typically at night, but has not noticed significant improvement. He does not use Q-tips and has not experienced any pain, discharge, or significant itching, though he mentions a slight itch or tickle sensation. No recent fever, cough, nasal congestion, or other respiratory symptoms. Review of SystemsAs outlined above.Past Medical History: has a past medical history of 2nd degree AV block, Erectile dysfunction, Gout, and Hypertension (03/2020).Allergies: Allergies[1]Current Outpatient Medications:   allopurinoL , 300 mg, Oral, Daily  atorvastatin , 20 mg, Oral, Daily  carbamide peroxide, 5 drop, Left Ear, BID  chlorthalidone , 25 mg, Oral, Daily  Blood Pressure Cuff, BP cuff. Medium. ICD-10: I10. Lifetime use.Depression Screening: PHQ2 0PHQ-2 Total Score: 0 OBJECTIVE:BP 118/71 (Site: l a, Position: Sitting, Cuff Size: Large)  - Pulse 82  - Ht 6' (1.829 m)  - Wt 81.4 kg  - SpO2 97%  - BMI 24.33 kg/m? Physical ExamConstitutional:     Appearance: Normal appearance. HENT:    Head: Normocephalic and atraumatic.    Comments: Renee's test indicating conductive hearing loss in the left ear   Left Ear: There is impacted cerumen.    Nose: Nose normal.    Mouth/Throat:    Mouth: Mucous membranes are moist.    Pharynx: Oropharynx is clear. Cardiovascular:    Rate and Rhythm: Normal rate and regular rhythm.    Pulses: Normal pulses.    Heart sounds: Normal heart sounds. Pulmonary:    Effort: Pulmonary effort is normal.    Breath sounds: Normal breath sounds. Abdominal:    General: Abdomen is flat. Bowel sounds are normal.    Palpations: Abdomen is soft. Musculoskeletal:       General: Normal range of motion. Skin:   General: Skin is warm and dry. Neurological:    General: No focal deficit present.    Mental Status: He is alert and oriented to person, place, and time. Mental status is at baseline. LABS:Lab Results Component Value Date  WBC 5.0 02/23/2023  HGB 16.9 02/23/2023  HCT 47.90 02/23/2023  MCV 99.2 02/23/2023  PLT 184 02/23/2023 Lab Results Component Value Date  CREATININE 1.20 02/23/2023  BUN 16 02/23/2023  NA 137 02/23/2023  K 3.8 02/23/2023  CL 100 02/23/2023  CO2 26 02/23/2023 Lab Results Component Value Date  GLU 100 02/23/2023  CREATININE 1.20 02/23/2023 Lab Results Component Value Date  HGBA1C 5.7 (H) 10/06/2022  HGBA1C 5.5 02/01/2021  HGBA1C 5.5 01/19/2020 ASSESSMENT & PLAN:Patient Active Problem List Diagnosis SNOMED [place](R)  Right knee pain PAIN OF RIGHT KNEE REGION  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  Gout GOUT  Hyperuricemia HYPERURICEMIA  Hypertension HYPERTENSIVE DISORDER  Erectile dysfunction ERECTILE DYSFUNCTION  Tobacco use disorder TOBACCO USER  Hypokalemia HYPOKALEMIA Hearing loss due to cerumen impactionConductive hearing loss in left ear due to significant cerumen obstruction.-    left ear irrigation performed in the clinic, with cerumen removed, however patient still having some cerumen in the ear that could not be removed with residual hearing loss.- patient advised to continue using Debrox  eardrops-  ENT referral placedHealthcare maintenance: Health Maintenance Topic Date Due  Shingles vaccine (Shingrix) (1 of 2 - Shingrix (RZV) 2 Dose Standard Series) Never done  Lung Cancer Screening  Never done  Pneumococcal Vaccine (50+ years) (1 of 1 - PCV) Never done  Covid-19 vaccine series (3 - 2024-25 season) 10/01/2022  Influenza vaccine  10/01/2023  Prediabetes Surveillance  10/06/2023  Diabetes screening  02/22/2026  Lipid disorder screening  10/06/2027  Colon cancer screening, Colonoscopy  06/22/2030  RSV Immunization (1 - 1-dose 75+ series) 06/18/2031  Tetanus adult (Td q 10,TDAP once)  12/14/2031  Hepatitis C screening  Completed  HIV screening  Completed  Aortic Aneurysm screening  Completed  Meningococcal Vaccine  Aged Out  Colon cancer screening,FIT DNA (Cologuard)  Discontinued Immunization History Administered Date(s) Administered  Influenza, high dose, quad, 0.7 mL, preservative free 12/13/2021  Influenza, injectable, quadrivalent, preservative free 11/13/2019, 02/01/2021  Tdap 12/13/2021 Follow up: Return in 3 month(s).Next Visit: Follow up on hearing lossDiscussed plan of care with Attending Physician: Dr. SharmaSIGNED:Payton Prinsen K Iyengar7/03/2023 9:39 AMCONSENT I provided a concise overview of the ambient note generation solution. Brett [name] or their legally authorized representative verbally consented to a temporary audio recording of their visit to assist with completing the visit documentation using an AI-powered solution. This note was reviewed for accuracy by Brett MARLA Lecher, MD who performed the clinical service.  [1] No Known Allergies

## 2023-10-02 ENCOUNTER — Encounter: Admit: 2023-10-02 | Payer: PRIVATE HEALTH INSURANCE | Attending: Internal Medicine

## 2023-10-03 ENCOUNTER — Encounter: Admit: 2023-10-03 | Payer: PRIVATE HEALTH INSURANCE

## 2023-10-15 ENCOUNTER — Ambulatory Visit: Admit: 2023-10-15 | Payer: Managed Care (Private)

## 2023-10-15 DIAGNOSIS — Z5321 Procedure and treatment not carried out due to patient leaving prior to being seen by health care provider: Secondary | ICD-10-CM

## 2023-10-17 NOTE — Progress Notes
 Patient walks into clinic c/o  right leg cramping sensation and left shoulder pain that been going on for 2 weeks. Stated he moves at work a lot can not have this problem.  Patient thought he was going to see a provider today. However, there is no appt available at the moment. Trying to get more informations to offer an appt, patient walks out  the room didn't want to wait. Stated  I can't do anything for him right now

## 2023-10-18 ENCOUNTER — Encounter: Admit: 2023-10-18 | Payer: PRIVATE HEALTH INSURANCE

## 2023-10-18 ENCOUNTER — Emergency Department: Admit: 2023-10-18 | Payer: Managed Care (Private)

## 2023-10-18 ENCOUNTER — Inpatient Hospital Stay: Admit: 2023-10-18 | Discharge: 2023-10-18 | Payer: Managed Care (Private)

## 2023-10-18 DIAGNOSIS — K056 Periodontal disease, unspecified: Secondary | ICD-10-CM

## 2023-10-18 DIAGNOSIS — M109 Gout, unspecified: Principal | ICD-10-CM

## 2023-10-18 DIAGNOSIS — K0889 Other specified disorders of teeth and supporting structures: Secondary | ICD-10-CM

## 2023-10-18 DIAGNOSIS — I441 Atrioventricular block, second degree: Secondary | ICD-10-CM

## 2023-10-18 DIAGNOSIS — I10 Essential (primary) hypertension: Secondary | ICD-10-CM

## 2023-10-18 DIAGNOSIS — N529 Male erectile dysfunction, unspecified: Secondary | ICD-10-CM

## 2023-10-18 DIAGNOSIS — M26609 Unspecified temporomandibular joint disorder, unspecified side: Principal | ICD-10-CM

## 2023-10-18 DIAGNOSIS — J029 Acute pharyngitis, unspecified: Secondary | ICD-10-CM

## 2023-10-18 DIAGNOSIS — R519 Headache, unspecified: Secondary | ICD-10-CM

## 2023-10-18 MED ORDER — CHLORHEXIDINE GLUCONATE 0.12 % MOUTHWASH
0.12 | Freq: Two times a day (BID) | ORAL | 1 refills | 30.00000 days | Status: AC
Start: 2023-10-18 — End: ?

## 2023-10-18 MED ORDER — CYCLOBENZAPRINE 10 MG TABLET
10 | ORAL_TABLET | Freq: Two times a day (BID) | ORAL | 1 refills | 30.00000 days | Status: AC | PRN
Start: 2023-10-18 — End: ?

## 2023-10-18 MED ORDER — DEXAMETHASONE 1 MG/ML DROPS (CONCENTRATE)
1 | Freq: Once | ORAL | Status: DC
Start: 2023-10-18 — End: 2023-10-18

## 2023-10-18 MED ORDER — ZZ IMS TEMPLATE
Freq: Once | ORAL | Status: CP
Start: 2023-10-18 — End: ?
  Administered 2023-10-18: 10:00:00 0.5 mg via ORAL

## 2023-10-18 MED ORDER — PROCHLORPERAZINE EDISYLATE 10 MG/2 ML (5 MG/ML) INJECTION SOLUTION
10 | Freq: Once | INTRAMUSCULAR | Status: CP
Start: 2023-10-18 — End: ?
  Administered 2023-10-18: 09:00:00 10 mL via INTRAMUSCULAR

## 2023-10-18 MED ORDER — IBUPROFEN 400 MG TABLET
400 | Freq: Once | ORAL | Status: CP
Start: 2023-10-18 — End: ?
  Administered 2023-10-18: 09:00:00 400 mg via ORAL

## 2023-10-18 MED ORDER — IBUPROFEN 400 MG TABLET
400 | ORAL_TABLET | Freq: Four times a day (QID) | ORAL | 1 refills | 14.00000 days | Status: AC | PRN
Start: 2023-10-18 — End: ?

## 2023-10-18 MED ORDER — ACETAMINOPHEN 500 MG TABLET
500 | ORAL_TABLET | Freq: Four times a day (QID) | ORAL | 1 refills | 5.00000 days | Status: AC | PRN
Start: 2023-10-18 — End: ?

## 2023-10-18 NOTE — Discharge Instructions
 Buy occlusal splint Ibuprofen  400 mg every 6 hours Tyelnol 1000 mg every 6 hours (avoid any other product with acetaminophen /tylenol ) See Dentist Flexeril 10 mg if needed at night

## 2023-10-18 NOTE — ED Provider Notes
 Chief Complaint Patient presents with  Headache- New Onset Or New Symptoms   Took tylenol  at 430am not helpng  Dental Pain   Seen Tuesday at walkin dx strep started lido rinse and antibiotics but continued headache and tooth/mouth pain upper mouth       HPI/PE:68 year old male , past medical history of hypertension, secondary heart block status post permanent pacemaker, for chief complaint of headache, dental pain.  Patient reports that Monday morning, woke up with a headache.  Headache was left-sided temporal region, radiating to the frontal region.  Since then intermittently responsive to Tylenol  and ibuprofen  however headache is progressing, worsening.  Had gone to the urgent Care, walking after also developing a sore throat, pain with swallowing, subjective fevers where he was tested for strep throat however negative, still prescribed antibiotics due to high risk, amoxicillin 500 mg b.i.d. along with a lidocaine  mouth rinse.  Patient reports that the pain was somewhat improving however the headache is now worsening, reoccurring, unresponsive to Tylenol  ibuprofen .  Also reports that over the last 24-48 hours, developing gum pain, localized to the left upper gums above the central incisor stenting to the left upper canine.  There is no purulent discharge or gum erythema.  Reports that his throat discomfort is improving overall.  No change in his voice or difficulty opening his mouth.  Was told in the past that after dental removal, may develop the tendency to increase chewing on 1 side of the jaw.  No facial droop, no slurring of his speech, no unilateral weakness, no acute vision changes, no chest pain shortness of breath or neck pain.FIF:Cpujod on arrival here within normal limits.  On exam patient well-appearing.  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  I considered giant cell arteritis however patient has no jaw claudication, and does not have any temporal artery or temporal tenderness.  Palpation temporal region relieved with the patient's pain which is inconsistent therefore does not warrant ESR CRP testing.  I considered glaucoma however patient does not have fixed and dilated pupils, globes are soft, no visual complaints.  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  No cranial nerve deficits suggest intracranial pathology however given new onset headache, persistent and worsening, we will acquire a head Annabella.  As per gum exam, there does not appear to be any dental abscesses or peritonsillar abscess or swelling of the floor of the mouth.  Receding gums with dental exposure, possibly increased sensitivity and likely etiology for the patient's pain.  Periodontal disease.  Aspirated posterior oropharynx, erythematous, appears to be healing with no exudates, no posterior tonsillar abscess, no uvular deviation.  Patient would benefit from steroids for increased pain relief.  Physical ExamED Triage Vitals [10/18/23 0825]BP: 134/82Pulse: 74Pulse from  O2 sat: n/aResp: 16Temp: 98.5 ?F (36.9 ?C)Temp src: OralSpO2: 99 % BP 134/82  - Pulse 74  - Temp 98.5 ?F (36.9 ?C) (Oral)  - Resp 16  - Wt 79.2 kg (174 lb 9.7 oz)  - SpO2 99%  - BMI 23.68 kg/m? Physical ExamHENT:    Head: Normocephalic and atraumatic.    Comments: When palpating in the temporal region, patient reports that it relieves his pain.  There is no temporal artery tenderness.  Right Ear: Ear canal and external ear normal.    Left Ear: Ear canal and external ear normal.    Ears:    Comments: No mastoid tenderness bilaterally.  No posterior ear swelling.  No narrowing of the external auditory canal.   Nose: Nose normal.    Mouth/Throat:    Mouth: Mucous membranes are moist.    Pharynx: Posterior oropharyngeal erythema present. No oropharyngeal exudate.    Comments: Posterior oropharynx erythema.  Uvula is midline.  No peritonsillar exudates.  No tongue swelling or elevation of the tongue.  Missing right central upper incisor along with right lateral upper incisor, right upper premolar.  Area above the left upper central incisor extending to the left canine have receding gums with proximal tooth exposure.  There is no purulent discharge.  There is no foul odor.  When pressing the gums, palpation relieves the discomfort and pain.  Gums without  bleeding.  No stridor.  No trismus.  Malalignment of upper and lower jaw.Eyes:    Extraocular Movements: Extraocular movements intact.    Conjunctiva/sclera: Conjunctivae normal.    Pupils: Pupils are equal, round, and reactive to light.    Comments: Pupils are equal round reactive to light, no fixed and dilated pupil, no hazy cornea, globes are soft Neck:    Vascular: No carotid bruit. Cardiovascular:    Rate and Rhythm: Normal rate and regular rhythm.    Pulses: Normal pulses.         Carotid pulses are 2+ on the right side and 2+ on the left side.     Radial pulses are 2+ on the right side and 2+ on the left side.      Dorsalis pedis pulses are 2+ on the right side and 2+ on the left side.      Posterior tibial pulses are 2+ on the right side and 2+ on the left side.    Heart sounds: Normal heart sounds. Pulmonary:    Effort: Pulmonary effort is normal.    Breath sounds: Normal breath sounds. Abdominal:    Palpations: Abdomen is soft. Musculoskeletal:       General: No swelling.    Cervical back: Normal range of motion and neck supple. Skin:   General: Skin is warm.    Capillary Refill: Capillary refill takes less than 2 seconds. Neurological:    General: No focal deficit present. Mental Status: He is alert and oriented to person, place, and time. Mental status is at baseline.    Cranial Nerves: No cranial nerve deficit.    Sensory: No sensory deficit.    Motor: No weakness.    Coordination: Coordination normal.    Gait: Gait normal. Psychiatric:       Mood and Affect: Mood normal.        ProceduresAttestation/Critical CarePatient Reevaluation: Cale head negative for acute intracranial pathology.  Throughout ED course, resting comfortably, no acute distress.  Given pain along the left temporal region, malalignment of jaw, missing teeth, preferences chewing on 1 side, likely TMJ.  Made aware of the importance of occlusive splint, ibuprofen  Tylenol , muscle relaxant night.  Importance of following up dental clinic.  Also due to the receding gums, dental pain, periodontal disease, chlorhexidine  mouthwash prescribed.  Continue with the amoxicillin.  Also status post Decadron  to assist with throat pain in the setting of viral pharyngitis.  Given the above, deemed appropriate for outpatient follow up.Given the above, patient deemed appropriate for outpatient workup and at this time is medically cleared for  discharge.  All vitals, labs, imaging have been reviewed and deemed appropriate for further management on the outpatient basis.  Patient is strongly encouraged to follow with the primary care team.  Also given strict return precautions and significant emphasis placed on the importance of returning to the emergency room if symptoms worsen for re-evaluation.  Appropriate time spent at the bedside addressing all questions or concerns.  Patient to be discharged.  Headache/jaw pain/dental pain also since greatly improvedCritical care provided by attending: no critical carePatient progress: improvedStroke DocumentationStroke/TIA: NoClinical Impressions as of 10/18/23 1355 TMJ (temporomandibular joint disorder) Periodontal disease Pharyngitis, unspecified etiology  ED DispositionNo disposition selected since last refresh of note.  Belynda Ahr, MD09/18/25 [phone]

## 2023-10-18 NOTE — ED Notes
 8:33 AM received pt to [facility] upper tooth pain  x 5 days with headache x 4 days [facility] and [facility] with strept and on abx but ha continues   provider in to eval

## 2023-10-27 ENCOUNTER — Encounter: Admit: 2023-10-27 | Payer: PRIVATE HEALTH INSURANCE

## 2023-11-05 ENCOUNTER — Encounter: Admit: 2023-11-05 | Payer: PRIVATE HEALTH INSURANCE | Attending: Internal Medicine

## 2023-11-05 ENCOUNTER — Ambulatory Visit: Admit: 2023-11-05 | Payer: PRIVATE HEALTH INSURANCE | Attending: Internal Medicine

## 2023-11-05 VITALS — BP 114/71 | HR 82 | Ht 72.0 in | Wt 175.0 lb

## 2023-11-05 DIAGNOSIS — I10 Essential (primary) hypertension: Secondary | ICD-10-CM

## 2023-11-05 DIAGNOSIS — I441 Atrioventricular block, second degree: Secondary | ICD-10-CM

## 2023-11-05 DIAGNOSIS — N529 Male erectile dysfunction, unspecified: Secondary | ICD-10-CM

## 2023-11-05 DIAGNOSIS — Z0000 Encounter for general adult medical examination without abnormal findings: Secondary | ICD-10-CM

## 2023-11-05 DIAGNOSIS — M1000 Idiopathic gout, unspecified site: Secondary | ICD-10-CM

## 2023-11-05 DIAGNOSIS — M109 Gout, unspecified: Principal | ICD-10-CM

## 2023-11-05 DIAGNOSIS — E785 Hyperlipidemia, unspecified: Principal | ICD-10-CM

## 2023-11-14 NOTE — Progress Notes
 [facility] PRIMARY CARE MEDICINE CLINICChart reviewed and case discussed with the resident.  Agree with the resident's plan with the following additions: 68 y.o. male with PMHx of prior smoking, 2nd degree AV block, gout here for follow up.  HTN- well controlled  Gout- prophylactic allopurinol ; notably pt is on chlorthalidone  which can increase risk for gout attacks. However, patient without gout flare for more than 4 years and BP is finally well controlled (has switched around BP meds in the past and this regimen works, has been on chlorthalidone  since 2022), will not change for now unless high uric acid or symptoms develop HLD- PREVENT 12.8 for CVD, 7.1 for ASCVD HCM: CBC, CMP, A1c, Lipid panel, uric acid FU: 3 months Brett Isle, MD Chief Resident, Internal Medicine10/6/20259:49 AM SUBJECTIVE:Brett [name] is a 68 y.o. male with PMHx of prior smoking, 2nd degree AV block, gout  who presents today for follow up. Brett [name] does not have any acute complaints today. He reports an episode of pharyngitis 2 weeks ago that has resolved. GoutNo recent flare ups in the last yearOn allopurinol  300 dailyHTNOffice reading of 114/71 today, no On chlorthalidone  25HLDDoes not take Lipitor. Prevent 12.8%. LDL 89 10/06/22. SmokingQuit 6-8 months agoHCMFlu - Declined today Lung cancer screen - Declines Review of SystemsPast Medical History: has a past medical history of 2nd degree AV block, Erectile dysfunction, Gout, and Hypertension (03/2020).Past Surgical History Patient  has a past surgical history that includes Pacemaker insertion; Hernia repair; and Colonoscopy w/ or w/o biopsy (06/21/2020).Family History Patient family history is not on file.Social History Patient  reports that he has quit smoking. His smoking use included cigarettes. He has a 12.8 pack-year smoking history. He has never used smokeless tobacco. He reports current alcohol use of about 5.0 standard drinks of alcohol per week. He reports that he does not use drugs.Allergies: Allergies[1]Current Outpatient Medications:   allopurinoL , 300 mg, Oral, Daily  atorvastatin , 20 mg, Oral, Daily  chlorthalidone , 25 mg, Oral, Daily  Blood Pressure Cuff, BP cuff. Medium. ICD-10: I10. Lifetime use.Depression Screening: PHQ2 PHQ-2 Total Score: 0 OBJECTIVE:BP 114/71 (Site: l a, Position: Sitting, Cuff Size: Large)  - Pulse 82  - Ht 6' (1.829 m)  - Wt 79.4 kg  - SpO2 98%  - BMI 23.73 kg/m? Physical ExamCardiovascular:    Rate and Rhythm: Normal rate and regular rhythm. Pulmonary:    Effort: Pulmonary effort is normal.    Breath sounds: Normal breath sounds. No wheezing or rales. Musculoskeletal:    Right lower leg: No edema.    Left lower leg: No edema. Neurological:    Mental Status: He is alert. LABS:Lab Results Component Value Date  WBC 5.0 02/23/2023  HGB 16.9 02/23/2023  HCT 47.90 02/23/2023  MCV 99.2 02/23/2023  PLT 184 02/23/2023 Lab Results Component Value Date  CREATININE 1.20 02/23/2023  BUN 16 02/23/2023  NA 137 02/23/2023  K 3.8 02/23/2023  CL 100 02/23/2023  CO2 26 02/23/2023 Lab Results Component Value Date  GLU 100 02/23/2023  CREATININE 1.20 02/23/2023 Lab Results Component Value Date  HGBA1C 5.7 (H) 10/06/2022  HGBA1C 5.5 02/01/2021  HGBA1C 5.5 01/19/2020 ASSESSMENT & PLAN:Patient Active Problem List Diagnosis SNOMED [place](R)  Right knee pain PAIN OF RIGHT KNEE REGION  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 Prevent 12.8%. LDL 89 10/06/22. - Check lipid panel- May require re initiation of LipitorHealthcare maintenance-     CBC and differential; Future-     Comprehensive metabolic panel; Future-     Hemoglobin A1C; FutureHealthcare maintenance: Health Maintenance Topic Date Due  Shingles vaccine (Shingrix) (1 of 2 - Shingrix (RZV) 2 Dose Standard Series) Never done  Lung Cancer Screening  Never done  Pneumococcal Vaccine (50+ years) (1 of 1 - PCV) Never done  Influenza vaccine  08/31/2023  Covid-19 vaccine series (3 - 2025-26 season) 10/01/2023  Prediabetes Surveillance  10/06/2023  Diabetes screening  02/22/2026  Lipid disorder screening  10/06/2027  Colon cancer screening, Colonoscopy  06/22/2030  RSV Immunization (1 - 1-dose 75+ series) 06/18/2031  Tetanus adult (Td q 10,TDAP once)  12/14/2031  Hepatitis C screening  Completed  HIV screening  Completed  Aortic Aneurysm screening  Completed  Meningococcal Vaccine  Aged Out  Meningococcal B Vaccine  Aged Out  Colon cancer screening,FIT DNA (Cologuard)  Discontinued Immunization History Administered Date(s) Administered  Influenza, high dose, quad, 0.7 mL, preservative free 12/13/2021  Influenza, injectable, quadrivalent, preservative free 11/13/2019, 02/01/2021  Tdap 12/13/2021 Follow up: Return in 3 month(s).Next Visit: Review results from this visitConsider re-starting LipitorDiscussed plan of care with Attending Physician: Dr. Arlee Pais, MDPGY-3, Internal Medicine10/07/2023   [1] No Known Allergies

## 2023-11-20 ENCOUNTER — Encounter: Admit: 2023-11-20 | Payer: PRIVATE HEALTH INSURANCE | Attending: Internal Medicine

## 2023-11-20 DIAGNOSIS — I10 Essential (primary) hypertension: Principal | ICD-10-CM

## 2023-11-22 MED ORDER — CHLORTHALIDONE 25 MG TABLET
25 | ORAL_TABLET | Freq: Every day | ORAL | 4 refills | 90.00000 days | Status: AC
Start: 2023-11-22 — End: ?

## 2023-12-03 ENCOUNTER — Encounter: Admit: 2023-12-03 | Payer: PRIVATE HEALTH INSURANCE

## 2023-12-03 ENCOUNTER — Encounter: Admit: 2023-12-03 | Payer: PRIVATE HEALTH INSURANCE | Attending: Internal Medicine

## 2023-12-03 ENCOUNTER — Ambulatory Visit: Admit: 2023-12-03 | Payer: PRIVATE HEALTH INSURANCE | Attending: Internal Medicine

## 2023-12-03 VITALS — BP 123/82 | HR 93 | Temp 98.00000°F | Resp 18 | Ht 72.0 in | Wt 175.0 lb

## 2023-12-03 DIAGNOSIS — M109 Gout, unspecified: Principal | ICD-10-CM

## 2023-12-03 DIAGNOSIS — M545 Low back pain, unspecified: Principal | ICD-10-CM

## 2023-12-03 DIAGNOSIS — I441 Atrioventricular block, second degree: Secondary | ICD-10-CM

## 2023-12-03 DIAGNOSIS — I10 Essential (primary) hypertension: Secondary | ICD-10-CM

## 2023-12-03 DIAGNOSIS — K921 Melena: Secondary | ICD-10-CM

## 2023-12-03 DIAGNOSIS — N529 Male erectile dysfunction, unspecified: Secondary | ICD-10-CM

## 2023-12-03 MED ORDER — CYCLOBENZAPRINE 5 MG TABLET
5 | ORAL_TABLET | Freq: Every evening | ORAL | 1 refills | 10.00000 days | Status: AC | PRN
Start: 2023-12-03 — End: ?

## 2023-12-03 NOTE — Patient Instructions [37]
 Please avoid ibuprofen jeronimo given your blood in stoolUse lidocaine  patch/tylenol  and the muscle relaxant for pain controlPlease follow up with the GI doctor for repeat colonoscopy

## 2023-12-05 NOTE — Progress Notes [1]
 [facility] PRIMARY CARE MEDICINE CLINICSUBJECTIVE:Brett [name] is a 68 y.o. male with PMHx of prior smoking, 2nd degree AV block, gout  who presents today for follow up. Subacute low back painHe has been experiencing lower back pain since Sunday, November 25, 2023. The pain is described as a 'pulling feeling' and resembles a pinched nerve, causing sharp pain with each step. Initially, the pain was rated as 9 out of 10 but has decreased to 5 or 6 out of 10 over the past week. There is no radiation of pain down the legs, and the pain is localized to the lower back. He recalls a previous fall from a four-foot ladder, resulting in a bruise on his back, but the pain from that incident resolved completely. He denies any recent trauma or specific incident that triggered the current back pain, noting that it began while getting up from watching TV. For pain management, he has been taking Aleve and ibuprofen , initially twice a day but now reduced to once a day as the pain has eased. 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 He reports no issues with urination or bowel movements, although he occasionally notices blood in his stool, which he attributes to straining.Review of SystemsNegative except as per HPIAllergies: Allergies[1]Current Outpatient Medications:   allopurinoL , 300 mg, Oral, Daily  atorvastatin , 20 mg, Oral, Daily  chlorthalidone , 25 mg, Oral, Daily  Blood Pressure Cuff, BP cuff. Medium. ICD-10: I10. Lifetime use.  cyclobenzaprine , 5 mg, Oral, Nightly PRNDepression Screening: PHQ2 PHQ-2 Total Score: 0 OBJECTIVE:BP 123/82 (Site: r a, Position: Sitting, Cuff Size: Medium)  - Pulse (!) 93  - Temp 98 ?F (36.7 ?C) (No-touch scanner)  - Resp 18  - Ht 6' (1.829 m)  - Wt 79.4 kg  - SpO2 99%  - BMI 23.73 kg/m? Physical ExamVitals reviewed. HENT:    Right Ear: Ear canal and external ear normal. There is no impacted cerumen.    Left Ear: Ear canal and external ear normal. There is no impacted cerumen.    Ears:    Comments: Cerumen b/l, nonimpacted   Mouth/Throat:    Mouth: Mucous membranes are moist.    Pharynx: No oropharyngeal exudate or posterior oropharyngeal erythema. Eyes:    General: No scleral icterus.   Pupils: Pupils are equal, round, and reactive to light. Cardiovascular:    Rate and Rhythm: Normal rate and regular rhythm.    Heart sounds: Normal heart sounds. No murmur heard.   No friction rub. No gallop. Pulmonary:    Effort: Pulmonary effort is normal. No respiratory distress.    Breath sounds: Normal breath sounds. No wheezing or rales. Musculoskeletal:    Right lower leg: No edema.    Left lower leg: No edema. Neurological:    Mental Status: He is alert and oriented to person, place, and time.    Cranial Nerves: No cranial nerve deficit.    Motor: No weakness.    Comments: Negative SLR b/l LABS:Lab Results Component Value Date  WBC 5.0 02/23/2023  HGB 16.9 02/23/2023  HCT 47.90 02/23/2023  MCV 99.2 02/23/2023  PLT 184 02/23/2023 Lab Results Component Value Date  CREATININE 1.20 02/23/2023  BUN 16 02/23/2023  NA 137 02/23/2023  K 3.8 02/23/2023  CL 100 02/23/2023  CO2 26 02/23/2023 Lab Results Component Value Date  GLU 100 02/23/2023  CREATININE  1.20 02/23/2023 Lab Results Component Value Date  HGBA1C 5.7 (H) 10/06/2022  HGBA1C 5.5 02/01/2021  HGBA1C 5.5 01/19/2020 ASSESSMENT & PLAN:Patient Active Problem List Diagnosis SNOMED [place](R)  Right knee pain PAIN OF RIGHT KNEE REGION  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 Pain is localized to the lower back, exacerbated by walking, and described as a pulling sensation. No radiation to legs, no red flags such as incontinence or neurological deficits. Pain improved with Aleve and ibuprofen , and muscle relaxants provided some relief. No recent trauma except a fall from a ladder last year, which resolved as a bruise. No current use of crutches or significant functional impairment, though climbing ladders is difficult.- Continue ibuprofen  as needed for pain management.- Referred to physical therapy for rehabilitation.- Will consider muscle relaxants if pain becomes more bothersome.Nonimpacted cerumen, bilateralBilateral cerumen impaction with wax present in both ears, not completely blocking the airway. No significant hearing impairment noted.- Continue using OTC ear wax remover.- Will consider ear flushing if no relief is achieved.Rectal bleeding, intermittentIntermittent rectal bleeding, possibly related to straining. No persistent symptoms or hemorrhoids noted in previous colonoscopy. Due for repeat colonoscopy as it has been three years since the last one.- Referred for colonoscopy to evaluate rectal bleeding.   - In-basket sent to scheduling team for urgent colonoscopyHealthcare maintenance: Health Maintenance Topic Date Due  Shingles vaccine (Shingrix) (1 of 2 - Shingrix (RZV) 2 Dose Standard Series) Never done  Lung Cancer Screening  Never done  Pneumococcal Vaccine (50+ years) (1 of 1 - PCV) Never done  Influenza vaccine  08/31/2023  Covid-19 vaccine series (3 - 2025-26 season) 10/01/2023  Prediabetes Surveillance  10/06/2023  Diabetes screening  02/22/2026  Lipid disorder screening  10/06/2027 Colon cancer screening, Colonoscopy  06/22/2030  RSV Immunization (1 - 1-dose 75+ series) 06/18/2031  Tetanus adult (Td q 10,TDAP once)  12/14/2031  Hepatitis C screening  Completed  HIV screening  Completed  Aortic Aneurysm screening  Completed  Meningococcal Vaccine  Aged Out  Meningococcal B Vaccine  Aged Out  Colon cancer screening,FIT DNA (Cologuard)  Discontinued Immunization History Administered Date(s) Administered  Influenza, high dose, quad, 0.7 mL, preservative free 12/13/2021  Influenza, injectable, quadrivalent, preservative free 11/13/2019, 02/01/2021  Tdap 12/13/2021 Follow up: Return in 3 month(s).Next Visit: Review pain controlEnsure colonoscopy completedHealthcare maintenanceDiscussed plan of care with Attending Physician: Dr. Garlon Gable Odonohue, MDPGY-2, Internal Medicine11/03/2023   [1] No Known Allergies

## 2023-12-10 ENCOUNTER — Ambulatory Visit: Admit: 2023-12-10 | Payer: PRIVATE HEALTH INSURANCE

## 2023-12-10 ENCOUNTER — Encounter: Admit: 2023-12-10 | Payer: PRIVATE HEALTH INSURANCE

## 2023-12-10 ENCOUNTER — Telehealth
Admit: 2023-12-10 | Payer: PRIVATE HEALTH INSURANCE | Attending: Student in an Organized Health Care Education/Training Program

## 2023-12-10 VITALS — BP 121/85 | HR 89 | Resp 20 | Ht 72.0 in | Wt 189.0 lb

## 2023-12-10 DIAGNOSIS — K5900 Constipation, unspecified: Secondary | ICD-10-CM

## 2023-12-10 DIAGNOSIS — K625 Hemorrhage of anus and rectum: Principal | ICD-10-CM

## 2023-12-10 DIAGNOSIS — K921 Melena: Secondary | ICD-10-CM

## 2023-12-10 DIAGNOSIS — I441 Atrioventricular block, second degree: Secondary | ICD-10-CM

## 2023-12-10 DIAGNOSIS — N529 Male erectile dysfunction, unspecified: Secondary | ICD-10-CM

## 2023-12-10 DIAGNOSIS — I10 Essential (primary) hypertension: Secondary | ICD-10-CM

## 2023-12-10 DIAGNOSIS — M109 Gout, unspecified: Principal | ICD-10-CM

## 2023-12-10 DIAGNOSIS — K648 Other hemorrhoids: Secondary | ICD-10-CM

## 2023-12-10 MED ORDER — PEG 3350-ELECTROLYTES 236 GRAM-22.74 GRAM-6.74 GRAM-5.86 GRAM SOLUTION
236-22.74-6.74 | Freq: Once | ORAL | 1 refills | 1.00000 days | Status: AC
Start: 2023-12-10 — End: ?

## 2023-12-10 NOTE — Patient Instructions [37]
 Prepare for your ColonoscopyWith GoLytely ? PrepSeven days before the colonoscopy- Stop taking iron supplements, fiber supplements, and anti-diarrheal medications. Aspirin , NSAIDs, and fish oil are okay to continue taking.- If you are taking any blood thinning medication, (Coumadin, Plavix?, Eliquis, etc.) please let our team know when they call you. They will give you clear instructions.- Avoid high fiber foods such as whole grain breads, quinoa, cereals, granola, nuts, oatmeal, corn, seeded foods, pepper, beans, vegetables, fruits and juice with pulp.- Review the diet you will need to follow for the week, which consists of foods low in fiber such as white rice, fish, white bread and chicken.- Arrange for someone to come with you to the procedure. We will give you medicine that will make you sleepy, so you cannot drive or take a bus home.- Please be sure you have received your prep medication.One day before the colonoscopyStart a clear liquid diet:A clear diet consists only clear liquids (liquids that you can see through) like:- Apple juice, seltzer, white cranberry juice or white grape juice- Clear broth- Coffee and tea (without milk or cream)- Jell-o? (not red, purple or orange)- Popsicles or [place] Ice (not red, purple or orange)Avoid juices with pulp, milk, cream and any solid foods. Do not have anything red, purple or orange.1. Before 5 pm drink two 8-ounce glasses every 15 minutes until half the GoLytely ? bottle is finished. Finish within 1 hour.2. Refrigerate remaining GoLytely ?.GoLytely ? is a laxative that will cause you to have many bowel movements. This is necessary so that you have a clean colon for the procedure. If you are having problems drinking all the GoLytely ?, please call your endoscopy center.The day of the colonoscopy- Six hours prior to your arrival time finish the GoLytley? solution. Drink two 8-ounce glasses every 15 minutes, and finish within one hour.- You need to finish drinking your prep and any liquids at least 4 - 5 hours prior to your arrival time.- For example, if your procedure is scheduled for 10 am, your scheduled arrival time is 9 am and you should refrain from drinking anything after 4 - 5 am. You can still take your morning medicines with a sip of water.- Take your regular medicines with a sip of water.- Bring your escort/driver with you to the appointment. If you do not arrive with an escort, your appointment will need to be rescheduled.If you develop any moderate to severe cold symptoms (cough, sore throat, runny nose, etc.), a fever, or experience any other changes in your health before your procedure, please call [phone] informationIf you need to speak to someone please call [phone]

## 2023-12-10 NOTE — Telephone Encounter [36]
 Called patient to schedule Colonoscopy  Unable to reach patient, left a voicemail to return call to [phone] ask for Apolinar to schedule. '

## 2023-12-10 NOTE — Progress Notes [1]
 [facility] Gastroenterology Consult Note 12/10/2023 Referred by: Maryetta Mai, MDReason for referral: HematocheziaReferral to Gastroenterology for Hematochezia (12/03/2023) Source of information: Patient, EMRSubjective: Brett [name] is a 68 y.o. male with a PMH of HTN, 2nd degree AVB and R BBB s/p PPM who presents for evaluation of hematochezia. Pt. presents w/a few episodes of minimal BRBPR and new onset constipation. Usually he has a BM every day, but recently has been constipated. He had 2 small, hard BMs over the past 2 days. Blood occurs after straining; small amounts on toilet paper and streaking the toilet bowl. His stools are brown in color. He denies abdominal pain, tenesmus, hematochezia, melena. Past Medical History[1]Past Surgical History[2]Social History:-Former smoker. 13 pack years.-5 standard drinks alcohol/week -No recreational drug useFamily History: no GI malignancy, Celiac, or IBD in first degree relatives Current Medications[3]Allergies[4]ROS: CONSTITUTIONAL: negative for fever, chills, weight loss, anorexia, fatigue, night sweats CV: negative for dyspnea, chest painMSK: positive for back painObjective: Vital SignPulse: 89Resp: 20BP: 121/85Current height, weight & BMIHeight: 6' (182.9 cm)Weight: 85.7 kgBMI (Calculated): 25.6 GENERAL: Awake, not in acute distressEYES: No scleral icterus, no conjunctival pallorENT: Moist mucous membranes, no oropharyngeal lesions, neck symmetric and suppleCARDIOVASCULAR: Regular rate and rhythm, no murmurs/rubs/gallopsRESPIRATORY: Clear to auscultation bilaterally, no wheezes/cracklesGASTROINTESTINAL: Abdomen soft, non-tender, non-distended. No guarding or rebound tenderness. Bowel sounds present.EXTREMITIES: No lower extremity edema.SKIN: Warm and dry. No jaundice. No skin lesions on visualized skin. NEURO: Alert and Oriented x 3, no gross focal deficitsLab Results Component Value Date  WBC 5.0 02/23/2023  RBC 4.83 02/23/2023  HGB 16.9 02/23/2023  HCT 47.90 02/23/2023  MCV 99.2 02/23/2023  PLT 184 02/23/2023  AST 37 (H) 02/23/2023  ALT 31 02/23/2023  ASALR 1.2 02/23/2023  BILITOT 0.7 02/23/2023  ALKPHOS 96 02/23/2023  ALBUMIN 4.5 02/23/2023  INR 1.00 05/21/2019  EGFRCREBLD >60 02/23/2023  BUN 16 02/23/2023  CREATININE 1.20 02/23/2023  BCR 13.3 02/23/2023  K 3.8 02/23/2023  CALCIUM 9.6 02/23/2023  TSH 1.820 02/01/2021  HGBA1C 5.7 (H) 10/06/2022 COLONOSCOPY (IMAGES) (06/21/2020 1:52 PM) Surgical pathology ([facility]) (06/21/2020 2:31 PM) Assessment: Brett [name] is a 68 y.o. male with a PMH of HTN who presents with minimal BRBPR and constipation. Overdue for surveillance colonoscopy; previous 05/2021 w/7 TAs, IH, & rectal varices. Pt purchased OTC constipation treatment. Follow up w/me after colonoscopy.Plan: -ColonoscopyBowel prep w/GoLytelyNo peri-procedural med changes Follow up: after colonoscopyThank you for allowing us  to participate in the continued care of Brett [name]. We will continue to follow along with you. Signed:Lebron Nauert Sammie, APRNGastroenterology DepartmentYale Eye Surgery Center [facility], [facility] 12/10/2023 [1] Past Medical History:Diagnosis Date  2nd degree AV block   Erectile dysfunction   Gout   Hypertension 03/2020 [2] Past Surgical History:Procedure Laterality Date  COLONOSCOPY W/ OR W/O BIOPSY  06/21/2020  Procedure: COLONOSCOPY, FLEXIBLE; W/BX, SINGLE/MULTIPLE;  Surgeon: Darice Aleck Brink, MD;  Location: [facility] ENDOSCOPY;  Service: Gastroenterology;;  HERNIA REPAIR    PACEMAKER INSERTION   [3] Current Outpatient Medications:   allopurinoL  (ZYLOPRIM ) 300 mg tablet, Take 1 tablet (300 mg total) by mouth daily., Disp: 30 tablet, Rfl: 3  atorvastatin  (LIPITOR) 20 mg tablet, Take 1 tablet (20 mg total) by mouth daily., Disp: 90 tablet, Rfl: 3  chlorthalidone  (HYGROTEN) 25 mg tablet, TAKE ONE TABLET BY MOUTH EVERY DAY, Disp: 30 tablet, Rfl: 3  cyclobenzaprine  (FLEXERIL ) 5 mg tablet, Take 1 tablet (5 mg total) by mouth nightly as needed for muscle spasms., Disp: 30 tablet, Rfl: 0  Miscellaneous Medical Supply (BLOOD PRESSURE CUFF), BP cuff. Medium. ICD-10:  I10. Lifetime use., Disp: 1 each, Rfl: 0[4] No Known Allergies

## 2023-12-11 ENCOUNTER — Encounter: Admit: 2023-12-11 | Payer: PRIVATE HEALTH INSURANCE

## 2023-12-11 ENCOUNTER — Encounter: Admit: 2023-12-11 | Payer: PRIVATE HEALTH INSURANCE | Attending: Cardiovascular Disease

## 2023-12-11 DIAGNOSIS — Z950 Presence of cardiac pacemaker: Principal | ICD-10-CM

## 2023-12-12 ENCOUNTER — Encounter: Admit: 2023-12-12 | Payer: PRIVATE HEALTH INSURANCE

## 2023-12-12 ENCOUNTER — Ambulatory Visit: Admit: 2023-12-12 | Payer: PRIVATE HEALTH INSURANCE | Attending: Internal Medicine

## 2023-12-12 ENCOUNTER — Encounter: Admit: 2023-12-12 | Payer: PRIVATE HEALTH INSURANCE | Attending: Cardiovascular Disease

## 2023-12-12 DIAGNOSIS — Z950 Presence of cardiac pacemaker: Principal | ICD-10-CM

## 2023-12-13 ENCOUNTER — Telehealth: Admit: 2023-12-13 | Payer: PRIVATE HEALTH INSURANCE | Attending: Gastroenterology

## 2023-12-13 NOTE — Telephone Encounter [36]
 Scheduled Appointment Called and spoke with patient to schedule Colonoscopy  Procedure is scheduled 01/07/2024 arrival time 07:30 am. Patient is aware he will receive a call from nursing for Colonoscopy Education.

## 2023-12-13 NOTE — Telephone Encounter [36]
 Scheduled Appointment Called and spoke with patient to schedule Colonoscopy  Procedure is scheduled 01/07/2024 arrival time 07:30 am. Patient is aware he will receive a call from nursing for Colonoscopy Education.  Went over GoLytely Split-Dose preparation instructions form in detail with patient. Patient is aware of clear liquid diet. Will mail out instructions in English to patients home.  Patient advised he will have a family member or friend bring him to [facility] the day of the procedure and pick him up.  Patient is aware GoLytely will be sent to the pharmacy and patient advised he will pick up prep and use the instructions given verbally and the form he will receive. Patient was advised any questions or concerns to call [phone] and ask for [facility].  Patient verbalized understanding.

## 2023-12-14 ENCOUNTER — Telehealth: Admit: 2023-12-14 | Payer: PRIVATE HEALTH INSURANCE | Attending: Cardiovascular Disease

## 2023-12-21 ENCOUNTER — Encounter: Admit: 2023-12-21 | Payer: PRIVATE HEALTH INSURANCE

## 2023-12-28 ENCOUNTER — Encounter: Admit: 2023-12-28 | Payer: PRIVATE HEALTH INSURANCE | Attending: Cardiovascular Disease

## 2023-12-30 ENCOUNTER — Encounter: Admit: 2023-12-30 | Payer: PRIVATE HEALTH INSURANCE

## 2023-12-31 NOTE — Telephone Encounter [36]
 Spoke with patient and confirmed his upcoming colonoscopy on 01/07/24 at 7:30am [facility]. Patient did mention that he has been bleeding through his rectum for the past week but today it cleared up. Don't know if patient can't wait for colo results on Monday or if he needs medical attention before. Please advise.

## 2024-01-01 ENCOUNTER — Encounter: Admit: 2024-01-01 | Payer: PRIVATE HEALTH INSURANCE | Attending: Cardiovascular Disease

## 2024-01-06 ENCOUNTER — Ambulatory Visit
Admit: 2024-01-06 | Payer: Managed Care (Private) | Attending: Student in an Organized Health Care Education/Training Program

## 2024-01-06 DIAGNOSIS — Z1211 Encounter for screening for malignant neoplasm of colon: Principal | ICD-10-CM

## 2024-01-07 ENCOUNTER — Telehealth: Admit: 2024-01-07 | Payer: PRIVATE HEALTH INSURANCE

## 2024-01-07 ENCOUNTER — Ambulatory Visit
Admit: 2024-01-07 | Payer: Managed Care (Private) | Attending: Student in an Organized Health Care Education/Training Program

## 2024-01-07 ENCOUNTER — Inpatient Hospital Stay: Admit: 2024-01-07 | Discharge: 2024-01-07 | Payer: PRIVATE HEALTH INSURANCE | Attending: Gastroenterology

## 2024-01-07 ENCOUNTER — Encounter: Admit: 2024-01-07 | Payer: PRIVATE HEALTH INSURANCE | Attending: Gastroenterology

## 2024-01-07 DIAGNOSIS — D123 Benign neoplasm of transverse colon: Secondary | ICD-10-CM

## 2024-01-07 DIAGNOSIS — K6289 Other specified diseases of anus and rectum: Secondary | ICD-10-CM

## 2024-01-07 DIAGNOSIS — K640 First degree hemorrhoids: Secondary | ICD-10-CM

## 2024-01-07 DIAGNOSIS — I10 Essential (primary) hypertension: Secondary | ICD-10-CM

## 2024-01-07 DIAGNOSIS — D120 Benign neoplasm of cecum: Secondary | ICD-10-CM

## 2024-01-07 DIAGNOSIS — M109 Gout, unspecified: Principal | ICD-10-CM

## 2024-01-07 DIAGNOSIS — Z950 Presence of cardiac pacemaker: Secondary | ICD-10-CM

## 2024-01-07 DIAGNOSIS — Z87891 Personal history of nicotine dependence: Secondary | ICD-10-CM

## 2024-01-07 DIAGNOSIS — Z79899 Other long term (current) drug therapy: Secondary | ICD-10-CM

## 2024-01-07 DIAGNOSIS — N529 Male erectile dysfunction, unspecified: Secondary | ICD-10-CM

## 2024-01-07 DIAGNOSIS — Z860100 Personal history of colon polyps, unspecified: Secondary | ICD-10-CM

## 2024-01-07 DIAGNOSIS — Z1211 Encounter for screening for malignant neoplasm of colon: Secondary | ICD-10-CM

## 2024-01-07 DIAGNOSIS — I441 Atrioventricular block, second degree: Secondary | ICD-10-CM

## 2024-01-07 MED ORDER — SPOT ENDOSCOPIC MARKER
Status: DC | PRN
Start: 2024-01-07 — End: 2024-01-07
  Administered 2024-01-07: 09:00:00

## 2024-01-07 MED ORDER — LACTATED RINGERS INTRAVENOUS SOLUTION
INTRAVENOUS | Status: DC
Start: 2024-01-07 — End: 2024-01-07

## 2024-01-07 MED ORDER — LACTATED RINGERS INTRAVENOUS SOLUTION
INTRAVENOUS | Status: DC | PRN
Start: 2024-01-07 — End: 2024-01-07
  Administered 2024-01-07: 09:00:00 via INTRAVENOUS

## 2024-01-07 MED ORDER — LACTATED RINGERS INTRAVENOUS SOLUTION
INTRAVENOUS | Status: DC
Start: 2024-01-07 — End: 2024-01-07
  Administered 2024-01-07: 08:00:00 1000.000 mL/h via INTRAVENOUS

## 2024-01-07 MED ORDER — LIDOCAINE (PF) 20 MG/ML (2 %) INTRAVENOUS SOLUTION
20 | INTRAVENOUS | Status: DC | PRN
Start: 2024-01-07 — End: 2024-01-07
  Administered 2024-01-07: 09:00:00 20 mg/mL (2 %) via INTRAVENOUS

## 2024-01-07 MED ORDER — PROPOFOL 10 MG/ML INTRAVENOUS EMULSION
10 | INTRAVENOUS | Status: DC | PRN
Start: 2024-01-07 — End: 2024-01-07
  Administered 2024-01-07 (×2): 10 mg/mL via INTRAVENOUS

## 2024-01-07 MED ORDER — PROPOFOL 10 MG/ML INTRAVENOUS EMULSION
10 | INTRAVENOUS | Status: DC | PRN
Start: 2024-01-07 — End: 2024-01-07
  Administered 2024-01-07: 09:00:00 10 mL/h via INTRAVENOUS

## 2024-01-07 NOTE — PACU Transfer of Care [100004]
 Post Anesthesia Transfer of Care NotePatient: Brett Wayne SladeProcedure(s) Performed: Procedure(s) (LRB):COLONOSCOPY, FLEXIBLE; W/REMOVAL, LESION, SNARE (N/A)COLONOSCOPY, FLEXIBLE; W/DIRECTED SUBMUCOSA INJECTION(S), ANY SUBSTANCELast Vitals: I have reviewed the post-operative vital signs during the handoff as noted in the Epic chart.POSTOP HANDOFF :      Patient Location:  PACU     Level of Consciousness:  Sedated     VS stable since last recorded intra-op set? Yes       Oxygen source: maskPatient co-morbidities, intra-operative course, intake & output and antibiotics as per Anesthesia record were discussed with the RN.

## 2024-01-07 NOTE — Anesthesia Preprocedure Evaluation [24]
 This is a 68 y.o. male scheduled for COLONOSCOPY, FLEXIBLE; DX, W/WO SPECIMENS/COLON DECOMP (SEP PROC).Review of Systems/ Medical History Patient summary, nursing notes, EKG/Cardiac Studies , Labs, pre-procedure vitals, height, weight and NPO status reviewed.No previous anesthesia concernsAnesthesia Evaluation:   Estimated body mass index.01/07/24 : 23.73 kg/m? Last patient weight recorded. 01/07/24 : 79.4 kg Last patient height recorded. 01/07/24 : 6' (1.829 m) CC/HPI: Past Medical HistoryNo date: 2nd degree AV blockNo date: Erectile dysfunctionNo date: Gout02/2022: HypertensionPast Surgical History:  Past Surgical History:06/21/2020: COLONOSCOPY W/ OR W/O BIOPSY    Comment:  Procedure: COLONOSCOPY, FLEXIBLE; W/BX, SINGLE/MULTIPLE;             Surgeon: Darice Aleck Brink, MD;  Location: [facility]              ENDOSCOPY;  Service: Gastroenterology;;No date: HERNIA REPAIRNo date: PACEMAKER INSERTIONCardiovascular: Patient has a history of: hypertension.   -Exercise tolerance: >4 METS -Dysrhythmia(s): yes-Device(s): pacemaker-Vascular Disease:  Negative    Respiratory:  Negative.HEENT: Negative.Neuromuscular: NegativeSkeletal/Skin:  NegativeGastrointestinal/Genitourinary:  Negative Hematological/Lymphatic: NegativeEndocrine/Metabolic:  Negative.Additional Findings: No results for input(s): WBC, HGB, HCT, PLT in the last 168 hours.No results for input(s): NA, K, CL, CO2, BUN, CREATININE, GLU, ANIONGAP in the last 168 hours.No results for input(s): CALCIUM, MG, PHOS in the last 168 hours.No results for input(s): ALT, AST, ALKPHOS, BILITOT, BILIDIR in the last 168 hours.No results for input(s): PTT, LABPROT, INR in the last 168 hours.Diagnostics:No results found.No results found.  Physical ExamCardiovascular:    normal exam  Rhythm: regularHeart Sounds: S1 present and S2 present.Pulmonary:  normal exam  Patient's breath sounds clear to auscultationAirway:  Mallampati: IITM distance: >3 FBNeck ROM: fullDental:  unremarkable  Anesthesia PlanASA 3 The primary anesthesia plan is  general. Anesthesia informed consent obtained.  Type of Anesthesia informed consent obtained:  E-consentThe post operative pain plan is per surgeon management.Plan discussed with CRNA.Anesthesiologist's Pre Op NoteI personally evaluated and examined the patient prior to the intra-operative phase of care on the day of the procedure.SABRA

## 2024-01-07 NOTE — Anesthesia Postprocedure Evaluation [25]
 Anesthesia Post-op NotePatient: Brett Wayne SladeProcedure(s):  Procedure(s) (LRB):COLONOSCOPY, FLEXIBLE; W/REMOVAL, LESION, SNARE (N/A)COLONOSCOPY, FLEXIBLE; W/DIRECTED SUBMUCOSA INJECTION(S), ANY SUBSTANCE Last Vitals:  I have reviewed the post-operative vital signs as noted in the Epic chart.POSTOP EVALUATION:      Patient Recovery Location:  PACU     Vital Signs Status:  Stable     Patient Participation:  Patient participated     Mental Status:  Awake     Respiratory Status:  Acceptable     Airway Patency:  Patent     Cardiovascular/Hydration Status:  Stable     Pain Management:  Satisfactory to patient     Nausea/Vomiting Status:  Satisfactory to patientNo notable events documented.

## 2024-01-07 NOTE — Anesthesia Procedure/Diagnosis Confirmation Note [[id number]]
 Operative Diagnosis:Pre-op:   * No pre-op diagnosis entered * Patient Coded Diagnosis   Pre-op diagnosis: Bright red blood per rectum, Internal hemorrhoids, Constipation  Post-op diagnosis: Polyp of colon, unspecified part of colon, unspecified type, Hemorrhoids, unspecified hemorrhoid type  Patient Diagnosis   Pre-op diagnosis:   Post-op diagnosis: LARGE RECTAL VEINS    Post-op diagnosis:   * Polyp of colon, unspecified part of colon, unspecified type [K63.5]   * Hemorrhoids, unspecified hemorrhoid type [K64.9]Operative Procedure(s) :Procedure(s) (LRB):COLONOSCOPY, FLEXIBLE; W/REMOVAL, LESION, SNARE (N/A)COLONOSCOPY, FLEXIBLE; W/DIRECTED SUBMUCOSA INJECTION(S), ANY SUBSTANCEPost-op Procedure & Diagnosis ConfirmationPost-op Diagnosis: Post-op Diagnosis updated (see notes)     -  Polyp of colon, unspecified part of colon, unspecified type [K63.5]     Hemorrhoids, unspecified hemorrhoid type [K64.9] Post-op Procedure: Post-op Procedure updated (see notes)     - COLONOSCOPY, FLEXIBLE; W/REMOVAL, LESION, SNARE     COLONOSCOPY, FLEXIBLE; W/DIRECTED SUBMUCOSA INJECTION(S), ANY SUBSTANCE - 4.2 ML SPOT INJECTED INTO TRANSVERSE COLON POLYP SITE TISSUE; 0.4 ML SPOT INJECTED Anesthesia ClarifiersGI/Endoscopy: Planned Screening Colonoscopy - Procedure Performed (see above)

## 2024-01-07 NOTE — Telephone Encounter [36]
 lvm for patient advising in GI clinic has been scheduled for 12/22 at 2:20pm. Left address and call back number if needed.

## 2024-01-07 NOTE — Post-Op Note [[id number]]
 Uneventful recovery s/p colonoscopy. PO fluids taken with no problem. Dr. Darice came at bedside and spoke with pt. Discharge instructions provided and pt verbalized understanding.

## 2024-01-07 NOTE — H&P [4]
 [facility]	 Gastroenterology History & PhysicalHistory provided by: the patientHistory limited by: no limitationsSubjective: Hx of polypsMedical History: PMH PSH Past Medical History[1] Past Surgical History[2] Social History Family History Social History Tobacco Use  Smoking status: Former   Current packs/day: 0.25   Average packs/day: 0.3 packs/day for 51.0 years (12.8 ttl pk-yrs)   Types: Cigarettes  Smokeless tobacco: Never  Tobacco comments:   8 months no smoking Substance Use Topics  Alcohol use: Yes   Alcohol/week: 5.0 standard drinks of alcohol   Types: 5 Glasses of wine per week   Comment: occasional drinker  History reviewed. No pertinent family history. Prior to Admission Medications Prescriptions Prior to Admission[3] Allergies Allergies[4] Review of Systems: Otherwise, he constitutional, head, eye, ENT, musculoskeletal, skin, endocrine, hematologic, allergy, and immunologic ROS are unremarkable.Objective: Vitals:Last 24 hours: Temp:  [97.5 ?F (36.4 ?C)] 97.5 ?F (36.4 ?C)Pulse:  [75] 75Resp:  [16] 16BP: (151)/(87) 151/87SpO2:  [98 %] 98 %Older male nadPERRLCTA b/lS1 S2 no mrgSoft/bs+/ntAssessment: Hx of polypsPlan: colonoscopySigned:Electronically Signed by Aleck Phoebe Bucy, MD, December 8, 202512/8/20258:21 AM [1] Past Medical History:Diagnosis Date  2nd degree AV block   Erectile dysfunction   Gout   Hypertension 03/2020 [2] Past Surgical History:Procedure Laterality Date  COLONOSCOPY W/ OR W/O BIOPSY  06/21/2020  Procedure: COLONOSCOPY, FLEXIBLE; W/BX, SINGLE/MULTIPLE;  Surgeon: Bucy Aleck Phoebe, MD;  Location: [facility] ENDOSCOPY;  Service: Gastroenterology;;  HERNIA REPAIR    PACEMAKER INSERTION   [3] Medications Prior to Admission Medication Sig Dispense Refill Last Dose/Taking  allopurinoL  (ZYLOPRIM ) 300 mg tablet Take 1 tablet (300 mg total) by mouth daily. 30 tablet 3 Past Month  atorvastatin  (LIPITOR) 20 mg tablet Take 1 tablet (20 mg total) by mouth daily. 90 tablet 3 Past Month  chlorthalidone  (HYGROTEN) 25 mg tablet TAKE ONE TABLET BY MOUTH EVERY DAY 30 tablet 3 01/05/2024  Miscellaneous Medical Supply (BLOOD PRESSURE CUFF) BP cuff. Medium. ICD-10: I10. Lifetime use. 1 each 0  [4] No Known Allergies

## 2024-01-08 ENCOUNTER — Ambulatory Visit: Admit: 2024-01-08 | Payer: PRIVATE HEALTH INSURANCE

## 2024-01-08 ENCOUNTER — Encounter: Admit: 2024-01-08 | Payer: PRIVATE HEALTH INSURANCE

## 2024-01-08 VITALS — BP 114/63 | HR 100 | Temp 98.00000°F | Ht 72.0 in | Wt 185.4 lb

## 2024-01-08 DIAGNOSIS — M109 Gout, unspecified: Principal | ICD-10-CM

## 2024-01-08 DIAGNOSIS — F17200 Nicotine dependence, unspecified, uncomplicated: Secondary | ICD-10-CM

## 2024-01-08 DIAGNOSIS — D179 Benign lipomatous neoplasm, unspecified: Secondary | ICD-10-CM

## 2024-01-08 DIAGNOSIS — N529 Male erectile dysfunction, unspecified: Secondary | ICD-10-CM

## 2024-01-08 DIAGNOSIS — I10 Essential (primary) hypertension: Secondary | ICD-10-CM

## 2024-01-08 DIAGNOSIS — M1A0690 Idiopathic chronic gout, unspecified knee, without tophus (tophi): Secondary | ICD-10-CM

## 2024-01-08 DIAGNOSIS — I441 Atrioventricular block, second degree: Secondary | ICD-10-CM

## 2024-01-08 DIAGNOSIS — R7303 Prediabetes: Secondary | ICD-10-CM

## 2024-01-08 MED ORDER — CHLORTHALIDONE 25 MG TABLET
25 | ORAL_TABLET | Freq: Every day | ORAL | 2 refills | 90.00000 days | Status: AC
Start: 2024-01-08 — End: ?

## 2024-01-09 NOTE — Progress Notes [1]
 [facility] PRIMARY CARE MEDICINE CLINICSUBJECTIVE:Brett [name] is a 68 y.o. 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 He was last seen in [facility] 12/2023 and c/o subacute low back pain that began in end October 2025. 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 He had an ultrasound of it in 12/2022 which showed 6.4 cm subcutaneous soft tissue mass within the area of clinical concern, suggestive of a lipoma. MRI imaging may be undertaken to further clarify, if there is pain, rapid growth in size or as clinically appropriate. The mass today appears doubled in size. He reports no issues with urination, and he occasionally notices blood in his stool and constipation. He has been overdue for surveillance colonoscopy; previous 05/2021 w/7 TAs, IH, & rectal varices. He was seen urgently by GI in 12/2023 and had a colonoscopy 01/07/24. He will follow up with GI Dr Darice on 01/21/24. He denies constipation and no BRBPR or melena.Colonoscopy 01/07/24 report shows: - One 7 mm polyp in the cecum, removed - One 10 mm polyp in the transverse colon, removed- Internal hemorrhoids.- Rectal varices.- No aspirin /nsaids for 5 days post procedure- Repeat colonoscopy in 1 year for surveillancebased on pathology results due to piecemeal polypectomy. Regarding his cardiac hx, he last had a pacemaker check in 05/2023. They found a well functioning pacemaker without pacing burden. No programming changes made- SPECT MPI 07/2022 revealed normal myocardial perfusion.- He is also scheduled for echo in 2 weeks and will follow with cardiology primed afterwards- He checks his bp at home occasionally and ranges from systolic 125-130 and diastolic 65-70- He reports compliance with chlorthalidone  daily- He does not take his statinRegarding gout, he has not had a flare in a year. He usually experiences it in right knee. He does not take his allopurinol  daily. Smoker: quit 1 year ago. Never had LDCT and does not want oneOf note, patient has head cold for 1 day, some rhinnorhea, headache (pressure band around temporal lobes), feeling feverish although apyrexic, no cough.  He has some tylenol  at home and will take it when he gets back. He does not want any other symptomatic treatment including intranasal spray. BP 114/63 with HR 100 reggularReview of Systems Constitutional:  Positive for fatigue. Negative for activity change, appetite change, chills, diaphoresis, fever and unexpected weight change. HENT:  Positive for congestion and rhinorrhea. Negative for ear discharge, ear pain, facial swelling, hearing loss, mouth sores, postnasal drip, sinus pressure, sinus pain, sneezing and sore throat.  Eyes: Negative.  Respiratory:  Positive for cough. Negative for shortness of breath, wheezing and stridor.  Cardiovascular: Negative.  Gastrointestinal: Negative.  Negative for blood in stool. Endocrine: Negative.  Genitourinary: Negative.  Musculoskeletal:  Positive for back pain. Skin: Negative.  Allergic/Immunologic: Negative.  Neurological: Negative.  Hematological: Negative.  Psychiatric/Behavioral: Negative.   Past Medical History: has a past medical history of 2nd degree AV block, Erectile dysfunction, Gout, and Hypertension (03/2020).Allergies: Allergies[1]Current Outpatient Medications:   allopurinoL , 300 mg, Oral, Daily (Patient taking differently: 300 mg, Oral, DAILY PRN, patient takes once in a while, mainly when he feels flare is coming up)  Blood Pressure Cuff, BP cuff. Medium. ICD-10: I10. Lifetime use.  atorvastatin , 20 mg, Oral, Daily (Patient not taking: Reported on 01/08/2024)  chlorthalidone , 25 mg, Oral, DailyDepression Screening: PHQ2 PHQ-2 Total Score: 0 OBJECTIVE:BP 114/63 (Site: r a, Position:  Sitting, Cuff Size: Medium)  - Pulse (!) 100  - Temp 98 ?F (36.7 ?C)  - Ht 6' (1.829 m)  - Wt 84.1 kg  - SpO2 97%  - BMI 25.14 kg/m? Physical ExamConstitutional:     Appearance: Normal appearance. He is not ill-appearing or diaphoretic. HENT:    Nose: Congestion present. No rhinorrhea.    Mouth/Throat:    Mouth: Mucous membranes are moist.    Pharynx: No oropharyngeal exudate or posterior oropharyngeal erythema. Eyes:    Conjunctiva/sclera: Conjunctivae normal.    Pupils: Pupils are equal, round, and reactive to light. Cardiovascular:    Rate and Rhythm: Regular rhythm. Tachycardia present. Pulmonary:    Effort: Pulmonary effort is normal. No respiratory distress.    Breath sounds: Normal breath sounds. No stridor. No wheezing or rhonchi. Abdominal:    General: Abdomen is flat. Bowel sounds are normal.    Palpations: Abdomen is soft. There is mass (right back). Musculoskeletal:    Right lower leg: No edema.    Left lower leg: No edema. Lymphadenopathy:    Cervical: No cervical adenopathy. Neurological:    Mental Status: He is alert and oriented to person, place, and time. Foot Exam:There is no deformity or skin breakdown.LABS:Lab Results Component Value Date  WBC 5.0 02/23/2023  HGB 16.9 02/23/2023  HCT 47.90 02/23/2023  MCV 99.2 02/23/2023  PLT 184 02/23/2023 Lab Results Component Value Date  CREATININE 1.20 02/23/2023  BUN 16 02/23/2023  NA 137 02/23/2023  K 3.8 02/23/2023  CL 100 02/23/2023  CO2 26 02/23/2023 Lab Results Component Value Date  GLU 100 02/23/2023  CREATININE 1.20 02/23/2023 Lab Results Component Value Date  HGBA1C 5.7 (H) 10/06/2022  HGBA1C 5.5 02/01/2021  HGBA1C 5.5 01/19/2020 ASSESSMENT & PLAN:Lipoma, right backAmbulatory referral to General SurgeryViral URTISymptomatic management with po hydration and tylenol  for headacheAdvised patient to avoid nsaids/aspirin  for 5 days given recent polypectomyPatient does not want any other symptomatic tx including intranasal oxymetazoline or atrovent Hematochezia, intermittentMinimal BRBPR and constipation. He has been overdue for surveillance colonoscopy; previous 05/2021 w/7 TAs, IH, & rectal varices. He was seen urgently by GI in 12/2023 and had a colonoscopy 01/07/24, which showed rectal varices, internal hemorrhoids and 2 polyps removed in cecum and transverse colon. He will follow up with GI Dr Darice on 01/21/24.Monitor BMFollow with GI 12/22/25Colonoscopy in 1 yearGoutPatient without gout flare for more than 4 years and BP is well controlled (has switched around BP meds in the past and this regimen works, has been on chlorthalidone  since 2022). Keep in same regimen for now unless high uric acid or flares Patient does not take allopurinol  300mg  daily; he notes to take it when he feels flare startingWe discussed this as a maintenance medication and should not be taken with flares onlyNotably pt is on chlorthalidone  which can increase risk for gout attacks. HLDHyperlipidemia PREVENT score 12.5 for 10 year CVD, based on labs from 05/2024Continue atorvastatin  20mg  dailyHypertensionBP logContinue chlorthalidone  25mg  dailyCheck BMP today for potassiumPrediabetesA1c 10/2022: 5.7Prediabetes surveillance today Counseled on diet and exercise with patient education providedCheck A1cCheck lipid panel- nb patient not compliant with statinPacemakerLast device check in 05/2023 with no programming changes madeScheduled for echo 12/23/25FU with primed 12/30/25Healthcare maintenance: LDCT, flu shot, a1cHealth Maintenance Topic Date Due  Shingles vaccine (Shingrix) (1 of 2 - Shingrix (RZV) 2 Dose Standard Series) Never done  Lung Cancer Screening  Never done  Pneumococcal Vaccine (50+ years) (1 of 1 - PCV) Never done  Influenza vaccine  08/31/2023 Covid-19 vaccine series (3 -  2025-26 season) 10/01/2023  Prediabetes Surveillance  10/06/2023  Diabetes screening  02/22/2026  Lipid disorder screening  10/06/2027  RSV Immunization (1 - 1-dose 75+ series) 06/18/2031  Tetanus adult (Td q 10,TDAP once)  12/14/2031  Colon cancer screening, Colonoscopy  01/06/2034  Hepatitis C screening  Completed  HIV screening  Completed  Aortic Aneurysm screening  Completed  Meningococcal Vaccine  Aged Out  Meningococcal B Vaccine  Aged Out  Colon cancer screening,FIT DNA (Cologuard)  Discontinued Immunization History Administered Date(s) Administered  Influenza, high dose, quad, 0.7 mL, preservative free 12/13/2021  Influenza, injectable, quadrivalent, preservative free 11/13/2019, 02/01/2021  Tdap 12/13/2021 Follow up: Return in 3 month(s).Next Visit: Follow polypectomy pathology and GI noteFollow echo and cardiology noteFollow A1c, cholesterolFollow BMP for potassiumFollow gen surg plan for lipomaAsk if patient has remained off smokingDiscussed plan of care with Attending Physician: Dr. AdjepongSIGNED:Leena Tiede Anoop Chotrani12/08/2023 3:00 PM  [1] No Known Allergies

## 2024-01-12 ENCOUNTER — Encounter: Admit: 2024-01-12 | Payer: PRIVATE HEALTH INSURANCE

## 2024-01-21 ENCOUNTER — Ambulatory Visit: Admit: 2024-01-21 | Payer: PRIVATE HEALTH INSURANCE

## 2024-01-21 ENCOUNTER — Encounter: Admit: 2024-01-21 | Payer: PRIVATE HEALTH INSURANCE

## 2024-01-21 VITALS — BP 164/97 | HR 98 | Temp 97.00000°F | Resp 18 | Ht 72.0 in | Wt 182.2 lb

## 2024-01-21 DIAGNOSIS — I441 Atrioventricular block, second degree: Secondary | ICD-10-CM

## 2024-01-21 DIAGNOSIS — N529 Male erectile dysfunction, unspecified: Secondary | ICD-10-CM

## 2024-01-21 DIAGNOSIS — K649 Unspecified hemorrhoids: Principal | ICD-10-CM

## 2024-01-21 DIAGNOSIS — D126 Benign neoplasm of colon, unspecified: Secondary | ICD-10-CM

## 2024-01-21 DIAGNOSIS — I10 Essential (primary) hypertension: Secondary | ICD-10-CM

## 2024-01-21 DIAGNOSIS — M109 Gout, unspecified: Principal | ICD-10-CM

## 2024-01-21 DIAGNOSIS — K648 Other hemorrhoids: Secondary | ICD-10-CM

## 2024-01-21 NOTE — Progress Notes [1]
 [facility] Gastroenterology Consult Note 01/21/2024 Referred by: Maryetta Mai, MD Reason for referral: HematocheziaReferral to Gastroenterology for Hematochezia (12/03/2023) Source of information: Patient, EMRSubjective: Brett [name] is a 68 y.o. male with a PMH of HTN, 2nd degree AVB and BBB s/p PPM, prediabetes who presents for evaluation of hematochezia. He was initially seen in GI clinic on 12/10/23 w/a few episodes of minimal, painless bright red blood per rectum. He had a colonoscopy on 12/8 which was significant for nonbleeding internal hemorrhoids, rectal varices, and two tubular adenomas (1 cm and 7 mm). Today, he continues to deny abdominal pain, tenesmus, hematochezia, and melena. There have been no further episodes of BRBPR. He has a 13 pack-year smoking history (quit last year) and drinks about 3 mixed drinks per day. There is no family history of GI malignancy or liver disease. No systemic symptoms.Objective: Vital SignTemp: 97 ?F (36.1 ?C)Pulse: (!) 98Resp: 18BP: (!) 164/97 (patient denies HA/CP. reports just took BP meds)Current height, weight & BMIHeight: 6' (182.9 cm)Weight: 82.6 kgBMI (Calculated): 24.7 GENERAL: Awake, not in acute distressEYES: No scleral icterus, no conjunctival pallorENT: Moist mucous membranes, no oropharyngeal lesions, neck symmetric and suppleCARDIOVASCULAR: Regular rate and rhythm, no murmurs/rubs/gallopsRESPIRATORY: Clear to auscultation bilaterally, no wheezes/cracklesGASTROINTESTINAL: Abdomen soft, non-tender, non-distended. No guarding or rebound tenderness. Bowel sounds present.EXTREMITIES: No lower extremity edema.SKIN: Warm and dry. No jaundice. No skin lesions on visualized skin. NEURO: Alert and Oriented x 3, no gross focal deficitsLab Results Component Value Date  WBC 5.0 02/23/2023  RBC 4.83 02/23/2023  HGB 16.9 02/23/2023  HCT 47.90 02/23/2023  MCV 99.2 02/23/2023  PLT 184 02/23/2023  AST 37 (H) 02/23/2023  ALT 31 02/23/2023  ASALR 1.2 02/23/2023  BILITOT 0.7 02/23/2023  ALKPHOS 96 02/23/2023  ALBUMIN 4.5 02/23/2023  INR 1.00 05/21/2019  EGFRCREBLD >60 02/23/2023  BUN 16 02/23/2023  CREATININE 1.20 02/23/2023  BCR 13.3 02/23/2023  K 3.8 02/23/2023  CALCIUM 9.6 02/23/2023  TSH 1.820 02/01/2021  HGBA1C 5.7 (H) 10/06/2022 Colonoscopy (01/07/2024 7:10 AM) Surgical pathology ([facility]) (01/07/2024 8:40 AM) Assessment and Plan: Houston Zapien is a 68 y.o. male with a PMH of HTN, 2nd degree AVB and R BBB s/p PPM, prediabetes who presents with minimal bright red blood per rectum. Had a colonoscopy on 01/07/24 which showed two tubular adenomas (7 mm and 10 mm), medium-sized rectal varices, and internal hemorrhoids. He will need follow up colonoscopy in 1 year due to piecemeal polyp resection. Etiology of rectal varices unclear; he has no documented history of liver disease. He does report heavy alcohol use (3 mixed drinks/day). Advised cessation.His last CMP in Jan 2025 was unremarkable. I will add on HFP, viral hepatitis serologies, and refer him to hepatology clinic for further workupFollow up: 1 year Thank you for allowing us  to participate in the continued care of [facility]. We will continue to follow along with you. Signed:Mckell Riecke Sammie, APRNGastroenterology DepartmentYale Children'S Hospital, [facility] 12/22/20252:22 PM

## 2024-01-22 ENCOUNTER — Encounter: Admit: 2024-01-22 | Payer: PRIVATE HEALTH INSURANCE

## 2024-01-22 DIAGNOSIS — I10 Essential (primary) hypertension: Principal | ICD-10-CM

## 2024-01-29 ENCOUNTER — Encounter: Admit: 2024-01-29 | Payer: PRIVATE HEALTH INSURANCE | Attending: Cardiovascular Disease

## 2024-01-29 VITALS — BP 118/68 | HR 72 | Ht 72.0 in | Wt 178.2 lb

## 2024-01-29 DIAGNOSIS — M109 Gout, unspecified: Principal | ICD-10-CM

## 2024-01-29 DIAGNOSIS — I10 Essential (primary) hypertension: Principal | ICD-10-CM

## 2024-01-29 DIAGNOSIS — N529 Male erectile dysfunction, unspecified: Secondary | ICD-10-CM

## 2024-01-29 DIAGNOSIS — I441 Atrioventricular block, second degree: Secondary | ICD-10-CM

## 2024-01-29 NOTE — Progress Notes [1]
 Device ClinicHPI66 year old man withpast medical history of 2nd-degree AVB, RBBB s/p PPM, CKD, gout, erectile dysfunction, hypertension.He presents today for device check.[facility] 01/[facility]23 for evaluation of noncardiac chest painHe saw PMD 05/2023, due to back pain after fall from a ladder.He has ongoing back pain 12/2023 and soft tissue swelling in L lumbosacral spineHe denies angina, claudication, or HF symptoms.Battery 9.5 yrsPresenting/Underlying rhythm:Sinus 73  b[employer]; implanted 05/22/2019 RA RV LV HV  Amplitude (mV) 5.3 25    Impedance (ohms) [id number]    Threshold (V@ms ) 0.4@0 .4 0.6@0 .4   Settings		DDD 60-120% A Pacing 	1%% V Pacing 	1%% AT/AF  	0 %	Mode Switch 	n/a	No sig arrhythmiasA/PWell functioning pacemakerMinimal pacing buirdenNo programming changes made.SPECT MPI 07/2022 revealed normal myocardial perfusion.Continue Rx atorvastatin  20 mg daily for dyslipidemiaNo changes made to device programming or medication regimen.U.C. Issabelle Mcraney, MD, FACCCardiac ElectrophysiologyYale Forest Health [facility] Center [address] [place], [place] [address]Eynwz: [phone]: [phone]

## 2024-02-04 ENCOUNTER — Encounter: Admit: 2024-02-04 | Payer: PRIVATE HEALTH INSURANCE

## 2024-03-29 ENCOUNTER — Ambulatory Visit: Admit: 2024-03-29 | Payer: PRIVATE HEALTH INSURANCE

## 2024-04-01 ENCOUNTER — Ambulatory Visit: Admit: 2024-04-01 | Payer: PRIVATE HEALTH INSURANCE

## 2024-06-11 ENCOUNTER — Ambulatory Visit: Admit: 2024-06-11 | Payer: PRIVATE HEALTH INSURANCE

## 2024-07-29 ENCOUNTER — Encounter: Admit: 2024-07-29 | Payer: PRIVATE HEALTH INSURANCE | Attending: Cardiovascular Disease
